# Patient Record
Sex: Female | Born: 1990 | Race: Black or African American | Hispanic: No | Marital: Married | Smoking: Former smoker
Health system: Southern US, Community
[De-identification: ages and names within clinical notes are randomized; demographics above are authoritative.]

## PROBLEM LIST (undated history)

## (undated) ENCOUNTER — Inpatient Hospital Stay (HOSPITAL_COMMUNITY): Payer: Self-pay

## (undated) ENCOUNTER — Emergency Department (HOSPITAL_COMMUNITY): Discharge status: Absent without leave | Payer: Medicaid Other

## (undated) DIAGNOSIS — N760 Acute vaginitis: Secondary | ICD-10-CM

## (undated) DIAGNOSIS — Z3490 Encounter for supervision of normal pregnancy, unspecified, unspecified trimester: Secondary | ICD-10-CM

## (undated) DIAGNOSIS — O139 Gestational [pregnancy-induced] hypertension without significant proteinuria, unspecified trimester: Secondary | ICD-10-CM

## (undated) DIAGNOSIS — B9689 Other specified bacterial agents as the cause of diseases classified elsewhere: Secondary | ICD-10-CM

## (undated) DIAGNOSIS — L709 Acne, unspecified: Secondary | ICD-10-CM

## (undated) DIAGNOSIS — M419 Scoliosis, unspecified: Secondary | ICD-10-CM

## (undated) DIAGNOSIS — O99345 Other mental disorders complicating the puerperium: Principal | ICD-10-CM

## (undated) DIAGNOSIS — F53 Postpartum depression: Secondary | ICD-10-CM

## (undated) DIAGNOSIS — O039 Complete or unspecified spontaneous abortion without complication: Secondary | ICD-10-CM

## (undated) HISTORY — DX: Scoliosis, unspecified: M41.9

## (undated) HISTORY — DX: Other mental disorders complicating the puerperium: O99.345

## (undated) HISTORY — DX: Complete or unspecified spontaneous abortion without complication: O03.9

## (undated) HISTORY — PX: NO PAST SURGERIES: SHX2092

## (undated) HISTORY — DX: Acute vaginitis: N76.0

## (undated) HISTORY — DX: Postpartum depression: F53.0

## (undated) HISTORY — DX: Acne, unspecified: L70.9

## (undated) HISTORY — DX: Other specified bacterial agents as the cause of diseases classified elsewhere: B96.89

---
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
## 2011-09-27 ENCOUNTER — Encounter (HOSPITAL_COMMUNITY): Payer: Self-pay | Admitting: Emergency Medicine

## 2011-09-27 ENCOUNTER — Emergency Department (HOSPITAL_COMMUNITY): Payer: Medicaid Other

## 2011-09-27 ENCOUNTER — Emergency Department (HOSPITAL_COMMUNITY)
Admission: EM | Admit: 2011-09-27 | Discharge: 2011-09-28 | Disposition: A | Discharge status: Home / Self Care | Payer: Medicaid Other | Source: Home / Self Care | Attending: Emergency Medicine | Admitting: Emergency Medicine

## 2011-09-27 DIAGNOSIS — R109 Unspecified abdominal pain: Secondary | ICD-10-CM | POA: Insufficient documentation

## 2011-09-27 DIAGNOSIS — O039 Complete or unspecified spontaneous abortion without complication: Secondary | ICD-10-CM | POA: Insufficient documentation

## 2011-09-27 DIAGNOSIS — O200 Threatened abortion: Secondary | ICD-10-CM | POA: Insufficient documentation

## 2011-09-27 HISTORY — DX: Encounter for supervision of normal pregnancy, unspecified, unspecified trimester: Z34.90

## 2011-09-27 LAB — DIFFERENTIAL
Basophils Absolute: 0 10*3/uL (ref 0.0–0.1)
Eosinophils Absolute: 0.2 10*3/uL (ref 0.0–0.7)
Lymphocytes Relative: 22 % (ref 12–46)
Lymphs Abs: 2.3 10*3/uL (ref 0.7–4.0)
Neutro Abs: 7.3 10*3/uL (ref 1.7–7.7)
Neutrophils Relative %: 71 % (ref 43–77)

## 2011-09-27 LAB — BASIC METABOLIC PANEL
CO2: 23 mEq/L (ref 19–32)
Chloride: 103 mEq/L (ref 96–112)
Creatinine, Ser: 0.62 mg/dL (ref 0.50–1.10)
Glucose, Bld: 84 mg/dL (ref 70–99)
Potassium: 4.2 mEq/L (ref 3.5–5.1)

## 2011-09-27 LAB — CBC
MCH: 28.9 pg (ref 26.0–34.0)
Platelets: 318 10*3/uL (ref 150–400)
RBC: 4.32 MIL/uL (ref 3.87–5.11)

## 2011-09-27 LAB — PREGNANCY, URINE: Preg Test, Ur: POSITIVE — AB

## 2011-09-27 LAB — WET PREP, GENITAL: Yeast Wet Prep HPF POC: NONE SEEN

## 2011-09-27 LAB — HCG, QUANTITATIVE, PREGNANCY: hCG, Beta Chain, Quant, S: 1630 m[IU]/mL — ABNORMAL HIGH (ref <5)

## 2011-09-27 MED ORDER — ACETAMINOPHEN 325 MG PO TABS
650.0000 mg | ORAL_TABLET | Freq: Once | ORAL | Status: AC
  Administered 2011-09-27: 650 mg via ORAL
  Filled 2011-09-27: qty 2

## 2011-09-27 NOTE — ED Notes (Addendum)
Pt states she is [redacted] weeks pregnant.  C/o lower abd cramping and lower back pain since 4am.  Also reports "light" vaginal bleeding since 8am.  LMP 07/16/11

## 2011-09-27 NOTE — ED Notes (Signed)
WARM BLANKETS PROVIDED WHILE WAITING AT TRIAGE.

## 2011-09-27 NOTE — ED Provider Notes (Signed)
History     CSN: [id number]  Arrival date & time 09/27/11  Linda Warner   First MD Initiated Contact with Patient 09/27/11 2102      Chief Complaint  Patient presents with  . Abdominal Cramping  . Vaginal Bleeding   HPI  History provided by the patient. Patient is a 21 year old female who is G1 P0 currently believed to be [redacted] weeks pregnant who presents with complaints of lower abdominal cramping and small amounts of vaginal bleeding and began early this morning. Patient states that she has her first on July appointment with Dr. Emelda Fear this coming week but is not had any evaluation so far for her pregnancy. Patient has been feeling well until early this morning she had lower abdominal cramping and sharp pains in small amounts of bleeding. Patient mostly notices bleeding when using the restroom and wiping with tissue. She has not noticed any other vaginal discharge. Patient denies any significant nausea, vomiting symptoms. She denies fever, chills, sweats, diarrhea or constipation. She denies any dysuria, urinary frequency or hematuria. Patient has no other significant medical conditions.     Past Medical History  Diagnosis Date  . Acne   . Scoliosis   . Pregnant     History reviewed. No pertinent past surgical history.  Family History  Problem Relation Age of Onset  . Depression Father   . Depression Maternal Grandmother     History  Substance Use Topics  . Smoking status: Never Smoker   . Smokeless tobacco: Not on file  . Alcohol Use: No    OB History    Grav Para Term Preterm Abortions TAB SAB Ect Mult Living   1               Review of Systems  Constitutional: Negative for fever and chills.  Respiratory: Negative for shortness of breath.   Cardiovascular: Negative for chest pain.  Gastrointestinal: Positive for abdominal pain. Negative for nausea, vomiting, diarrhea and constipation.  Genitourinary: Positive for vaginal bleeding. Negative for dysuria, frequency,  hematuria, flank pain and vaginal discharge.  Skin: Negative for rash.    Allergies  Review of patient's allergies indicates no known allergies.  Home Medications   Current Outpatient Rx  Name Route Sig Dispense Refill  . DIPHENHYDRAMINE HCL 25 MG PO TABS Oral Take 25 mg by mouth daily as needed. For allergies    . PRENATAL 27-0.8 MG PO TABS Oral Take 1 tablet by mouth daily.      BP 115/79  Pulse 98  Temp(Src) 98.4 F (36.9 C) (Oral)  Resp 18  SpO2 100%  LMP 07/16/2011  Physical Exam  Nursing note and vitals reviewed. Constitutional: She is oriented to person, place, and time. She appears well-developed and well-nourished. No distress.  HENT:  Head: Normocephalic and atraumatic.  Cardiovascular: Normal rate and regular rhythm.   Pulmonary/Chest: Effort normal and breath sounds normal. No respiratory distress. She has no wheezes. She has no rales.  Abdominal: Soft. She exhibits no distension. There is tenderness in the suprapubic area. There is no rebound, no guarding, no CVA tenderness, no tenderness at McBurney's point and negative Murphy's sign.       Mild suprapubic tenderness  Genitourinary:       Chaperone was present. There is vaginal bleeding and discharge from cervix. Cervix os is closed. No significant tenderness or adnexal mass or pressure.  Neurological: She is alert and oriented to person, place, and time.  Skin: Skin is warm and dry. No rash  noted.  Psychiatric: She has a normal mood and affect. Her behavior is normal.    ED Course  Procedures   Results for orders placed during the hospital encounter of 09/27/11  CBC      Component Value Range   WBC 10.3  4.0 - 10.5 (K/uL)   RBC 4.32  3.87 - 5.11 (MIL/uL)   Hemoglobin 12.5  12.0 - 15.0 (g/dL)   HCT 16.1 (*) 09.6 - 46.0 (%)   MCV 81.0  78.0 - 100.0 (fL)   MCH 28.9  26.0 - 34.0 (pg)   MCHC 35.7  30.0 - 36.0 (g/dL)   RDW 04.5  40.9 - 81.1 (%)   Platelets 318  150 - 400 (K/uL)  DIFFERENTIAL       Component Value Range   Neutrophils Relative 71  43 - 77 (%)   Neutro Abs 7.3  1.7 - 7.7 (K/uL)   Lymphocytes Relative 22  12 - 46 (%)   Lymphs Abs 2.3  0.7 - 4.0 (K/uL)   Monocytes Relative 5  3 - 12 (%)   Monocytes Absolute 0.5  0.1 - 1.0 (K/uL)   Eosinophils Relative 2  0 - 5 (%)   Eosinophils Absolute 0.2  0.0 - 0.7 (K/uL)   Basophils Relative 0  0 - 1 (%)   Basophils Absolute 0.0  0.0 - 0.1 (K/uL)  BASIC METABOLIC PANEL      Component Value Range   Sodium 138  135 - 145 (mEq/L)   Potassium 4.2  3.5 - 5.1 (mEq/L)   Chloride 103  96 - 112 (mEq/L)   CO2 23  19 - 32 (mEq/L)   Glucose, Bld 84  70 - 99 (mg/dL)   BUN 9  6 - 23 (mg/dL)   Creatinine, Ser 9.14  0.50 - 1.10 (mg/dL)   Calcium 9.7  8.4 - 78.2 (mg/dL)   GFR calc non Af Amer >90  >90 (mL/min)   GFR calc Af Amer >90  >90 (mL/min)  HCG, QUANTITATIVE, PREGNANCY      Component Value Range   hCG, Beta Chain, Quant, S 1630 (*) <5 (mIU/mL)  ABO/RH      Component Value Range   ABO/RH(D) A POS        US Ob Comp Less 14 Wks  09/28/2011  *RADIOLOGY REPORT*  Clinical Data: Vaginal bleeding and pain.  Quantitative beta HCG 1630.  Estimated gestational age by LMP is 10 weeks four dates  OBSTETRIC <14 WK [place] AND TRANSVAGINAL OB US  Technique:  Both transabdominal and transvaginal ultrasound examinations were performed for complete evaluation of the gestation as well as the maternal uterus, adnexal regions, and pelvic cul-de-sac.  Transvaginal technique was performed to assess early pregnancy.  Comparison:  None.  Intrauterine gestational sac:  A single intrauterine gestational sac is visualized. The gestational sac is somewhat elongated, extending down to the lower uterine segment. Yolk sac: Not visualized Embryo: Present.  No fetal motion is visualized. Cardiac Activity: Not demonstrated Heart Rate: N/A bpm  CRL: 30   mm  9   w  6   d          [place] EDC: 04/26/2012  Maternal uterus/adnexae: Cervical length is measured at 1.1 cm.  Ovaries are  visualized on transabdominal imaging and appear symmetrical.  No abnormal adnexal masses.  No free pelvic fluid collections.  IMPRESSION: Single intrauterine pregnancy is visualized.  Estimated gestational age by crown-rump length is 9 weeks 6 days.  No fetal motion or  fetal cardiac activity are observed during real time imaging. Changes are worrisome for fetal demise.  Results discussed by telephone at the time of dictation, 0055 hours on 09/28/2011, with Dr. Patria Mane.  Original Report Authenticated By: Marlon Pel, M.D.   US Ob Transvaginal  09/28/2011  *RADIOLOGY REPORT*  Clinical Data: Vaginal bleeding and pain.  Quantitative beta HCG 1630.  Estimated gestational age by LMP is 10 weeks four dates  OBSTETRIC <14 WK [place] AND TRANSVAGINAL OB US  Technique:  Both transabdominal and transvaginal ultrasound examinations were performed for complete evaluation of the gestation as well as the maternal uterus, adnexal regions, and pelvic cul-de-sac.  Transvaginal technique was performed to assess early pregnancy.  Comparison:  None.  Intrauterine gestational sac:  A single intrauterine gestational sac is visualized. The gestational sac is somewhat elongated, extending down to the lower uterine segment. Yolk sac: Not visualized Embryo: Present.  No fetal motion is visualized. Cardiac Activity: Not demonstrated Heart Rate: N/A bpm  CRL: 30   mm  9   w  6   d          [place] EDC: 04/26/2012  Maternal uterus/adnexae: Cervical length is measured at 1.1 cm.  Ovaries are visualized on transabdominal imaging and appear symmetrical.  No abnormal adnexal masses.  No free pelvic fluid collections.  IMPRESSION: Single intrauterine pregnancy is visualized.  Estimated gestational age by crown-rump length is 9 weeks 6 days.  No fetal motion or fetal cardiac activity are observed during real time imaging. Changes are worrisome for fetal demise.  Results discussed by telephone at the time of dictation, 0055 hours on 09/28/2011, with Dr.  Patria Mane.  Original Report Authenticated By: Marlon Pel, M.D.     1. Threatened abortion       MDM  Patient seen and evaluated. Patient no acute distress.   Have discussed findings from last testing ultrasounds with patient. She understands for concerning findings. Patient has been instructed to followup with OB/GYN or return to the [facility] hospital is developing any worsening symptoms or having large amounts of bleeding.     Angus Seller, [place] 09/28/11 [phone]

## 2011-09-28 ENCOUNTER — Encounter (HOSPITAL_COMMUNITY): Payer: Self-pay | Admitting: *Deleted

## 2011-09-28 ENCOUNTER — Inpatient Hospital Stay (HOSPITAL_COMMUNITY)
Admission: AD | Admit: 2011-09-28 | Discharge: 2011-09-28 | Disposition: A | Discharge status: Home / Self Care | Payer: Medicaid Other | Source: Ambulatory Visit | Attending: Obstetrics & Gynecology | Admitting: Obstetrics & Gynecology

## 2011-09-28 DIAGNOSIS — O039 Complete or unspecified spontaneous abortion without complication: Secondary | ICD-10-CM

## 2011-09-28 LAB — URINALYSIS, ROUTINE W REFLEX MICROSCOPIC
Glucose, UA: NEGATIVE mg/dL
Ketones, ur: 15 mg/dL — AB
Leukocytes, UA: NEGATIVE
pH: 6 (ref 5.0–8.0)

## 2011-09-28 LAB — CBC
Hemoglobin: 11.5 g/dL — ABNORMAL LOW (ref 12.0–15.0)
MCHC: 34.5 g/dL (ref 30.0–36.0)
RBC: 4.06 MIL/uL (ref 3.87–5.11)

## 2011-09-28 LAB — URINE MICROSCOPIC-ADD ON

## 2011-09-28 MED ORDER — IBUPROFEN 600 MG PO TABS: 600.0000 mg | ORAL_TABLET | Freq: Four times a day (QID) | ORAL | Status: AC | PRN

## 2011-09-28 MED ORDER — OXYCODONE-ACETAMINOPHEN 5-325 MG PO TABS: 1.0000 | ORAL_TABLET | Freq: Four times a day (QID) | ORAL | Status: AC | PRN

## 2011-09-28 MED ORDER — LACTATED RINGERS IV SOLN
INTRAVENOUS | Status: DC
  Administered 2011-09-28: 08:00:00 via INTRAVENOUS

## 2011-09-28 MED ORDER — HYDROMORPHONE HCL PF 1 MG/ML IJ SOLN
1.0000 mg | Freq: Once | INTRAMUSCULAR | Status: AC
  Administered 2011-09-28: 1 mg via INTRAVENOUS
  Filled 2011-09-28: qty 1

## 2011-09-28 NOTE — Discharge Instructions (Signed)
You were seen and evaluated today for your symptoms of vaginal bleeding and abdominal cramping during her pregnancy. At this time your ultrasound showed signs concerning for threatened miscarriage. Please read the attached information regarding this diagnosis. Please followup with your OB/GYN doctor tomorrow. Return to the [facility] hospital emergency room if you have any heavy bleeding, lightheadedness, fever, chills or persistent nausea vomiting.   Threatened Miscarriage Bleeding during the first 20 weeks of pregnancy is common. This is sometimes called a threatened miscarriage. This is a pregnancy that is threatening to end before the twentieth week of pregnancy. Often this bleeding stops with bed rest or decreased activities as suggested by your caregiver and the pregnancy continues without any more problems. You may be asked to not have sexual intercourse, have orgasms or use tampons until further notice. Sometimes a threatened miscarriage can progress to a complete or incomplete miscarriage. This may or may not require further treatment. Some miscarriages occur before a woman misses a menstrual period and knows she is pregnant. Miscarriages occur in 15 to 20% of all pregnancies and usually occur during the first 13 weeks of the pregnancy. The exact cause of a miscarriage is usually never known. A miscarriage is natures way of ending a pregnancy that is abnormal or would not make it to term. There are some things that may put you at risk to have a miscarriage, such as:  Hormone problems.   Infection of the uterus or cervix.   Chronic illness, diabetes for example, especially if it is not controlled.   Abnormal shaped uterus.   Fibroids in the uterus.   Incompetent cervix (the cervix is too weak to hold the baby).   Smoking.   Drinking too much alcohol. It's best not to drink any alcohol when you are pregnant.   Taking illegal drugs.  TREATMENT  When a miscarriage becomes complete and all  products of conception (all the tissue in the uterus) have been passed, often no treatment is needed. If you think you passed tissue, save it in a container and take it to your doctor for evaluation. If the miscarriage is incomplete (parts of the fetus or placenta remain in the uterus), further treatment may be needed. The most common reason for further treatment is continued bleeding (hemorrhage) because pregnancy tissue did not pass out of the uterus. This often occurs if a miscarriage is incomplete. Tissue left behind may also become infected. Treatment usually is dilatation and curettage (the removal of the remaining products of pregnancy. This can be done by a simple sucking procedure (suction curettage) or a simple scraping of the inside of the uterus. This may be done in the hospital or in the caregiver's office. This is only done when your caregiver knows that there is no chance for the pregnancy to proceed to term. This is determined by physical examination, negative pregnancy test, falling pregnancy hormone count and/or, an ultrasound revealing a dead fetus. Miscarriages are often a very emotional time for prospective mothers and fathers. This is not you or your partners fault. It did not occur because of an inadequacy in you or your partner. Nearly all miscarriages occur because the pregnancy has started off wrongly. At least half of these pregnancies have a chromosomal abnormality. It is almost always not inherited. Others may have developmental problems with the fetus or placenta. This does not always show up even when the products miscarried are studied under the microscope. The miscarriage is nearly always not your fault and it is  not likely that you could have prevented it from happening. If you are having emotional and grieving problems, talk to your health care provider and even seek counseling, if necessary, before getting pregnant again. You can begin trying for another pregnancy as soon as your  caregiver says it is OK. HOME CARE INSTRUCTIONS   Your caregiver may order bed rest depending on how much bleeding and cramping you are having. You may be limited to only getting up to go to the bathroom. You may be allowed to continue light activity. You may need to make arrangements for the care of your other children and for any other responsibilities.   Keep track of the number of pads you use each day, how often you have to change pads and how saturated (soaked) they are. Record this information.   DO NOT USE TAMPONS. Do not douche, have sexual intercourse or orgasms until approved by your caregiver.   You may receive a follow up appointment for re-evaluation of your pregnancy and a repeat blood test. Re-evaluation often occurs after 2 days and again in 4 to 6 weeks. It is very important that you follow-up in the recommended time period.   If you are Rh negative and the father is Rh positive or you do not know the fathers' blood type, you may receive a shot (Rh immune globulin) to help prevent abnormal antibodies that can develop and affect the baby in any future pregnancies.  SEEK IMMEDIATE MEDICAL CARE IF:  You have severe cramps in your stomach, back, or abdomen.   You have a sudden onset of severe pain in the lower part of your abdomen.   You develop chills.   You run an unexplained temperature of 101 F (38.3 C) or higher.   You pass large clots or tissue. Save any tissue for your caregiver to inspect.   Your bleeding increases or you become light-headed, weak, or have fainting episodes.   You have a gush of fluid from your vagina.   You pass out. This could mean you have a tubal (ectopic) pregnancy.  Document Released: 05/04/2005 Document Revised: 04/23/2011 Document Reviewed: 12/19/2007 [facility] Patient Information 2012 [place], [place].   RESOURCE GUIDE  Dental Problems  Patients with Medicaid: [facility] [phone] [address]                                           [phone] [address] Phone:  [phone]                                                  Phone:  [phone]  If unable to pay or uninsured, contact:  [facility] or [facility]. to become qualified for the adult dental clinic.  Chronic Pain Problems Contact [facility]  [phone] Patients need to be referred by their primary care doctor.  Insufficient Money for Medicine Contact [employer] Way:  call "211" or [facility] (731)387-3623.  No Primary Care Doctor Call [facility]  [phone] Other agencies that provide inexpensive medical care    [facility]  [phone]    [facility]  5198798910    [facility]  [phone]    [facility]  [phone]    [facility]  [phone]    [facility]  [phone]  Psychological Services [facility]  [phone] [facility] [place] [facility]  [phone] [facility]   [phone] (emergency services 2363324748)  Substance Abuse Resources [facility]  520-421-3607 [facility] 330-212-2148 [facility] 703-638-9811 Floydene Flock ([phone] [phone] Residential & Outpatient Substance Abuse Program  726-782-2190  Abuse/Neglect [facility] Child Abuse Hotline (727) 694-3491 [facility] Child Abuse Hotline 4093187023 (After Hours)  Emergency Shelter [facility] (248)078-9700  Maternity Homes [facility] at [facility] [place] [facility] 513-697-8940 [place] [facility] 6166775723  MRSA Hotline #:   810-546-8777    [facility] Resources  [facility] [place]     [employer]                          [facility] Dept. [address] [place]                       [address]      [address] [place] [address]  Blondell Reveal Phone:   [phone]                                   Phone:  [phone]                 Phone:  [phone]  [facility] Phone:  [phone]  [facility] Child Abuse Hotline 720-079-7737 403-649-9853 (After Hours)

## 2011-09-28 NOTE — MAU Note (Signed)
Was at [facility] yesterday. Dx with miscarriage.  0400 yesterday woke up with pain, went to [facility] at 1800. States was not bleeding this heavy when dc'd.  Heavy bleeding started this morning. Had thin pad on - soaked through- blood on clothes and wc.  Feeling light headed.Marland Kitchen

## 2011-09-28 NOTE — MAU Note (Signed)
Pad changed and peri care when settled in to rm. [place] from [facility] visit reviewed, explained to pt.  Failed preg- stopped growing at [redacted]w[redacted]d, no cardiac activity. Sac in lower uterus.  Pt is having cramping and bleeding because her body is trying to pass it.

## 2011-09-28 NOTE — MAU Provider Note (Signed)
History     CSN: [id number]  Arrival date and time: 09/28/11 0748   First Provider Initiated Contact with Patient 09/28/11 [phone]     20 y.o.G1P0 @[redacted]w[redacted]d  Chief Complaint  Patient presents with  . Miscarriage   HPI Pt presents with heavy vaginal bleeding starting a couple of hours ago, with 1 full pad soaked in last hour and passing a couple of fist-sized clots.  She was in MAU yesterday and diagnosed with threatened miscarriage and had u/s and quant hcg.  She denies h/a, dizziness, fever/chills.  Bleeding is decreased while pt in MAU.  OB History    Grav Para Term Preterm Abortions TAB SAB Ect Mult Living   1               Past Medical History  Diagnosis Date  . Acne   . Scoliosis   . Pregnant   . Scoliosis     Past Surgical History  Procedure Date  . No past surgeries     Family History  Problem Relation Age of Onset  . Depression Father   . Depression Maternal Grandmother   . Anesthesia problems Neg Hx     History  Substance Use Topics  . Smoking status: Former Games developer  . Smokeless tobacco: Never Used   Comment: quit prior to preg  . Alcohol Use: No    Allergies: No Known Allergies  Prescriptions prior to admission  Medication Sig Dispense Refill  . diphenhydrAMINE (BENADRYL) 25 MG tablet Take 25 mg by mouth daily as needed. For allergies      . oxyCODONE-acetaminophen (PERCOCET) 5-325 MG per tablet Take 1-2 tablets by mouth every 6 (six) hours as needed for pain.  20 tablet  0  . Prenatal Vit-Fe Fumarate-FA (MULTIVITAMIN-PRENATAL) 27-0.8 MG TABS Take 1 tablet by mouth daily.        Review of Systems  Constitutional: Negative for fever, chills and malaise/fatigue.  Eyes: Negative for blurred vision.  Respiratory: Negative for cough and shortness of breath.   Cardiovascular: Negative for chest pain.  Gastrointestinal: Negative for heartburn, nausea and vomiting.  Genitourinary: Negative for dysuria, urgency and frequency.  Musculoskeletal: Negative.     Neurological: Negative for dizziness and headaches.  Psychiatric/Behavioral: Negative for depression.   Physical Exam   Blood pressure 123/76, pulse 80, temperature 97.1 F (36.2 C), temperature source Oral, resp. rate 20, last menstrual period 07/16/2011, SpO2 10.00%.  Physical Exam  Nursing note and vitals reviewed. Constitutional: She is oriented to person, place, and time. She appears well-developed and well-nourished.  Neck: Normal range of motion.  Cardiovascular: Normal rate, regular rhythm and normal heart sounds.   Respiratory: Effort normal and breath sounds normal.  GI: Soft.  Genitourinary:       Pelvic exam: Cervix pink, without lesion, large amount bright red blood without clots in vaginal vault Bimanual exam: Cervix soft, slightly open, long, posterior, uterus tender upon palpation, nonpregnant size, adnexa without enlargement or mass   Musculoskeletal: Normal range of motion.  Neurological: She is alert and oriented to person, place, and time.  Skin: Skin is warm and dry.  Psychiatric: She has a normal mood and affect. Her behavior is normal. Judgment and thought content normal.   Recent Results (from the past 168 hour(s))  CBC   Collection Time   09/27/11 10:01 PM      Component Value Range   WBC 10.3  4.0 - 10.5 (K/uL)   RBC 4.32  3.87 - 5.11 (MIL/uL)   Hemoglobin  12.5  12.0 - 15.0 (g/dL)   HCT 45.4 (*) 09.8 - 46.0 (%)   MCV 81.0  78.0 - 100.0 (fL)   MCH 28.9  26.0 - 34.0 (pg)   MCHC 35.7  30.0 - 36.0 (g/dL)   RDW 11.9  14.7 - 82.9 (%)   Platelets 318  150 - 400 (K/uL)  DIFFERENTIAL   Collection Time   09/27/11 10:01 PM      Component Value Range   Neutrophils Relative 71  43 - 77 (%)   Neutro Abs 7.3  1.7 - 7.7 (K/uL)   Lymphocytes Relative 22  12 - 46 (%)   Lymphs Abs 2.3  0.7 - 4.0 (K/uL)   Monocytes Relative 5  3 - 12 (%)   Monocytes Absolute 0.5  0.1 - 1.0 (K/uL)   Eosinophils Relative 2  0 - 5 (%)   Eosinophils Absolute 0.2  0.0 - 0.7 (K/uL)    Basophils Relative 0  0 - 1 (%)   Basophils Absolute 0.0  0.0 - 0.1 (K/uL)  BASIC METABOLIC PANEL   Collection Time   09/27/11 10:01 PM      Component Value Range   Sodium 138  135 - 145 (mEq/L)   Potassium 4.2  3.5 - 5.1 (mEq/L)   Chloride 103  96 - 112 (mEq/L)   CO2 23  19 - 32 (mEq/L)   Glucose, Bld 84  70 - 99 (mg/dL)   BUN 9  6 - 23 (mg/dL)   Creatinine, Ser 5.62  0.50 - 1.10 (mg/dL)   Calcium 9.7  8.4 - 13.0 (mg/dL)   GFR calc non Af Amer >90  >90 (mL/min)   GFR calc Af Amer >90  >90 (mL/min)  HCG, QUANTITATIVE, PREGNANCY   Collection Time   09/27/11 10:01 PM      Component Value Range   hCG, Beta Chain, Quant, S 1630 (*) <5 (mIU/mL)  ABO/RH   Collection Time   09/27/11 10:03 PM      Component Value Range   ABO/RH(D) A POS    PREGNANCY, URINE   Collection Time   09/27/11 10:12 PM      Component Value Range   Preg Test, Ur POSITIVE (*) NEGATIVE   URINALYSIS, ROUTINE W REFLEX MICROSCOPIC   Collection Time   09/27/11 10:12 PM      Component Value Range   Color, Urine YELLOW  YELLOW    APPearance CLOUDY (*) CLEAR    Specific Gravity, Urine 1.025  1.005 - 1.030    pH 6.0  5.0 - 8.0    Glucose, UA NEGATIVE  NEGATIVE (mg/dL)   Hgb urine dipstick LARGE (*) NEGATIVE    Bilirubin Urine NEGATIVE  NEGATIVE    Ketones, ur 15 (*) NEGATIVE (mg/dL)   Protein, ur NEGATIVE  NEGATIVE (mg/dL)   Urobilinogen, UA 0.2  0.0 - 1.0 (mg/dL)   Nitrite NEGATIVE  NEGATIVE    Leukocytes, UA NEGATIVE  NEGATIVE   URINE MICROSCOPIC-ADD ON   Collection Time   09/27/11 10:12 PM      Component Value Range   Squamous Epithelial / LPF FEW (*) RARE    WBC, UA 3-6  <3 (WBC/hpf)   RBC / HPF 0-2  <3 (RBC/hpf)   Bacteria, UA MANY (*) RARE    Urine-Other MUCOUS PRESENT    WET PREP, GENITAL   Collection Time   09/27/11 11:09 PM      Component Value Range   Yeast Wet Prep HPF POC NONE SEEN  NONE SEEN    Trich, Wet Prep NONE SEEN  NONE SEEN    Clue Cells Wet Prep HPF POC FEW (*) NONE SEEN    WBC, Wet  Prep HPF POC FEW (*) NONE SEEN   GC/CHLAMYDIA PROBE AMP, GENITAL   Collection Time   09/27/11 11:10 PM      Component Value Range   GC Probe Amp, Genital NEGATIVE  NEGATIVE    Chlamydia, DNA Probe NEGATIVE  NEGATIVE   SAMPLE TO BLOOD BANK   Collection Time   09/28/11  8:15 AM      Component Value Range   Blood Bank Specimen SAMPLE AVAILABLE FOR TESTING     Sample Expiration 10/01/2011    HCG, QUANTITATIVE, PREGNANCY   Collection Time   09/28/11  8:15 AM      Component Value Range   hCG, Beta Chain, Quant, S 1258 (*) <5 (mIU/mL)  CBC   Collection Time   09/28/11  8:16 AM      Component Value Range   WBC 7.6  4.0 - 10.5 (K/uL)   RBC 4.06  3.87 - 5.11 (MIL/uL)   Hemoglobin 11.5 (*) 12.0 - 15.0 (g/dL)   HCT 16.1 (*) 09.6 - 46.0 (%)   MCV 82.0  78.0 - 100.0 (fL)   MCH 28.3  26.0 - 34.0 (pg)   MCHC 34.5  30.0 - 36.0 (g/dL)   RDW 04.5  40.9 - 81.1 (%)   Platelets 291  150 - 400 (K/uL)    MAU Course  Procedures CBC, quantitative hcg, IV LR, IV dilaudid  Assessment and Plan  SAB  D/C home with bleeding precautions Repeat quantitative hcg in MAU in 1 week Return to MAU as needed  Warner, Linda Krieger 09/28/2011, 8:21 AM

## 2011-09-28 NOTE — Discharge Instructions (Signed)
Miscarriage (Spontaneous Miscarriage)  A miscarriage is when you lose your baby before the twentieth week of pregnancy. Miscarriages happen in 15-20% of pregnancies. Most miscarriages happen in the first 13 weeks of the pregnancy. In medical terms, this is called a spontaneous miscarriage or early pregnancy loss. No further treatment is needed when the miscarriage is complete and all products of conception have been passed out of the body. You can begin trying for another pregnancy as soon as your caregiver says it is okay.  CAUSES    Most causes are not known.   Genetic problems like abnormal, not enough or too many chromosomes.   Infection of the cervix or uterus.   An abnormal shaped uterus, fibroid tumors or congenital abnormalities.   Hormone problems.   Medical problems.   Incompetent cervix, the tissue in the cervix is not strong enough to hold the pregnancy.   Smoking, too much alcohol use and illegal drugs.   Trauma.  SYMPTOMS    Bleeding or spotting from the vagina.   Cramping of the lower abdomen.   Passing of fluid from the vagina with or without cramps or pain.   Passing fetal tissue.  TREATMENT    Sometimes no further treatment is necessary if you pass all the tissue in the uterus.   If partial parts of the fetus or placenta remain in the body (incomplete miscarriage), tissue left behind may become infected. Usually a D and C (Dilatation and Curettage) suction or scrapping of the uterus is necessary to remove the remaining tissue in uterus. The procedure is only done when your caregiver knows that there is no chance for the pregnancy to continue. This is determined by a physical exam, a negative pregnancy test, blood tests and perhaps an ultrasound revealing a dead fetus or no fetus developing because a problem occurred at conception (when the sperm and egg unite).   Medications may be necessary, antibiotics if there is an infection or medications to contract the uterus if there is a  lot of bleeding.   If you have Rh negative blood and your partner is Rh positive, you will need a Rho-gam shot (an immune globulin vaccine). This will protect your baby from having Rh blood problems in future pregnancies.  HOME CARE INSTRUCTIONS    Your caregiver may order bed rest (up to the bathroom only). He or she may allow you to continue light activity. You may need to make arrangements for the care of children and for any other responsibilities.   Keep track of the number of pads you use each day and how soaked (saturated) they are. Record this information.   Do not use tampons. Do not douche or have sexual intercourse until approved by your caregiver.   Only take over-the-counter or prescription medicines for pain, discomfort or fever as directed by your caregiver.   Do not take aspirin because it can cause bleeding.   It is very important to keep all follow-up appointments for re-evaluations and continuing management.   Tell your caregiver if you are experiencing domestic violence.   Women who have an Rh negative blood type (i.e., A, B, AB, or O negative) need to receive a drug called Rh(D) immune globulin (RhoGam). This medicine helps protect future fetuses against problems that can occur if an Rh negative mother is carrying a baby who is Rh positive.   If you and/or your partner are having problems with guilt or grieving, talk to your caregiver or seek counseling to help   you cope with the pregnancy loss. Allow enough time to grieve before trying to get pregnant again.  SEEK IMMEDIATE MEDICAL CARE IF:    You have severe cramps or pain in your stomach, back, or belly (abdomen).   You have a fever.   You pass large clots or tissue. Save any tissue for your caregiver to inspect.   Your bleeding increases.   You become light-headed, weak or have fainting episodes.   You develop chills.  Document Released: 10/28/2000 Document Revised: 04/23/2011 Document Reviewed: 12/05/2007  [employer] Patient  Information 2012 [employer].

## 2011-09-29 LAB — GC/CHLAMYDIA PROBE AMP, URINE: Chlamydia, Swab/Urine, PCR: POSITIVE — AB

## 2011-09-29 NOTE — MAU Provider Note (Signed)
Attestation of Attending Supervision of Advanced Practitioner: Evaluation and management procedures were performed by the OB Fellow/PA/CNM/NP under my supervision and collaboration. Chart reviewed, and agree with management and plan.  Marvell Tamer, M.D. 09/29/2011 9:34 AM   

## 2011-09-29 NOTE — ED Provider Notes (Signed)
Medical screening examination/treatment/procedure(s) were performed by non-physician practitioner and as supervising physician I was immediately available for consultation/collaboration.  Donnetta Hutching, MD 09/29/11 1039

## 2011-09-30 NOTE — ED Notes (Signed)
Results received from [facility]. (+) Urine for Chlamydia.  Chart to MD office for review.

## 2011-10-02 NOTE — ED Notes (Signed)
Chart reviewed by C.Williams with order written  for Azithromycin 500 mg #2 take both tabs at once(1 gram).

## 2011-10-02 NOTE — ED Notes (Signed)
No answer

## 2011-10-06 ENCOUNTER — Encounter (HOSPITAL_COMMUNITY): Payer: Self-pay

## 2011-10-06 ENCOUNTER — Inpatient Hospital Stay (HOSPITAL_COMMUNITY)
Admission: AD | Admit: 2011-10-06 | Discharge: 2011-10-06 | Disposition: A | Discharge status: Home / Self Care | Payer: Medicaid Other | Source: Ambulatory Visit | Attending: Family Medicine | Admitting: Family Medicine

## 2011-10-06 DIAGNOSIS — O039 Complete or unspecified spontaneous abortion without complication: Secondary | ICD-10-CM | POA: Insufficient documentation

## 2011-10-06 NOTE — Progress Notes (Signed)
10/06/11 1130  Clinical Encounter Type  Visited With Patient  Visit Type Spiritual support;Social support (Bereavement (miscarriage))  Referral From Nurse Lupita Leash in MAU)  Spiritual Encounters  Spiritual Needs Emotional;Grief support  Stress Factors  Patient Stress Factors Family relationships;Loss of control (Socially isolated; recently lost job; now, miscarriage.)    Referred by Desyre's nurse Lupita Leash per pt request for pastoral support upon confirmation of her miscarriage.  Linda Warner reports little social support: her grandmother died, her mom lives in [place], she has complicated relationships with her siblings, and she lives with her boyfriend and his sister.  She states that she has a hard time opening up to people and has really needed someone to talk to.  Provided pastoral listening and reflection, grief and self-care education, encouragement, and witness to her story/struggle/hopes.  By the end of our visit, Linda Warner's affect was lighter and brighter, and she expressed gratitude for chaplain care and for her own emerging sense of hopefulness about her future.  Being able to generate steps in a self-care plan helped her to feel empowered.  She is aware of [facility] and has chaplains' number if she would like to reach out again, which I encouraged.  [address] [place], [place] [phone]

## 2011-10-06 NOTE — Discharge Instructions (Signed)
Miscarriage An early pregnancy loss or spontaneous abortion (miscarriage) is a common problem. This usually happens when the pregnancy is not developing normally. It is very unlikely that you or your partner did anything to cause this, although cigarette smoking, a sexually transmitted disease, excessive alcohol use, or drug abuse can increase the risk. Other causes are:  Abnormalities of the uterus.   Hormone or medical problems.   Trauma or genetic (chromosome) problems.  Having a miscarriage does not change your chances of having a normal pregnancy in the future. Your caregiver will advise you when it is safe to try to get pregnant again. AFTER A MISCARRIAGE  A miscarriage is inevitable when there is continual, heavy vaginal bleeding; cramping; dilation of the cervix; or passing of any pregnancy tissue. Bleeding and cramping will usually continue until all the tissue has been removed from the womb (uterus).   Often the uterus does not clean itself out completely. A medication or a D&C procedure is needed to loosen or remove the pregnancy tissue from the uterus. A D&C scrapes or suctions the tissue out.   If you are RH negative, you may need to have Rh immune globulin to avoid Rh problems.   You may be given medication to fight an infection if the miscarriage was due to an infection.  HOME CARE INSTRUCTIONS   You should rest in bed for the next 2 to 3 days.   Do not take tub baths or put anything in your vagina, including tampons or a douche.   Do not have sex until your caregiver approves.   Avoid exercise or heavy activities until directed by your caregiver.   Save any vaginal discharge that looks like tissue. Ask your caregiver if he or she wants to inspect the discharge.   If you and your partner are having problems with guilt or grieving, talk to your caregiver or get counseling to help you understand and cope with your pregnancy loss.   Allow enough time to grieve before  trying to get pregnant again.  SEEK IMMEDIATE MEDICAL CARE IF:   You have persistent heavy bleeding or a bad smelling vaginal discharge.   You have continued abdominal or pelvic pain.   You have an oral temperature above 102 F (38.9 C), not controlled by medicine.   You have severe weakness, fainting, or keep throwing up (vomiting).   You develop chills.   You are experiencing domestic violence.  MAKE SURE YOU:   Understand these instructions.   Will watch your condition.   Will get help right away if you are not doing well or get worse.  Document Released: 06/11/2004 Document Revised: 04/23/2011 Document Reviewed: 04/26/2008 [facility] Patient Information 2012 [place], [place].Miscarriage An early pregnancy loss or spontaneous abortion (miscarriage) is a common problem. This usually happens when the pregnancy is not developing normally. It is very unlikely that you or your partner did anything to cause this, although cigarette smoking, a sexually transmitted disease, excessive alcohol use, or drug abuse can increase the risk. Other causes are:  Abnormalities of the uterus.   Hormone or medical problems.   Trauma or genetic (chromosome) problems.  Having a miscarriage does not change your chances of having a normal pregnancy in the future. Your caregiver will advise you when it is safe to try to get pregnant again. AFTER A MISCARRIAGE  A miscarriage is inevitable when there is continual, heavy vaginal bleeding; cramping; dilation of the cervix; or passing of any pregnancy tissue. Bleeding and cramping  will usually continue until all the tissue has been removed from the womb (uterus).   Often the uterus does not clean itself out completely. A medication or a D&C procedure is needed to loosen or remove the pregnancy tissue from the uterus. A D&C scrapes or suctions the tissue out.   If you are RH negative, you may need to have Rh immune globulin to avoid Rh problems.   You may be  given medication to fight an infection if the miscarriage was due to an infection.  HOME CARE INSTRUCTIONS   You should rest in bed for the next 2 to 3 days.   Do not take tub baths or put anything in your vagina, including tampons or a douche.   Do not have sex until your caregiver approves.   Avoid exercise or heavy activities until directed by your caregiver.   Save any vaginal discharge that looks like tissue. Ask your caregiver if he or she wants to inspect the discharge.   If you and your partner are having problems with guilt or grieving, talk to your caregiver or get counseling to help you understand and cope with your pregnancy loss.   Allow enough time to grieve before trying to get pregnant again.  SEEK IMMEDIATE MEDICAL CARE IF:   You have persistent heavy bleeding or a bad smelling vaginal discharge.   You have continued abdominal or pelvic pain.   You have an oral temperature above 102 F (38.9 C), not controlled by medicine.   You have severe weakness, fainting, or keep throwing up (vomiting).   You develop chills.   You are experiencing domestic violence.  MAKE SURE YOU:   Understand these instructions.   Will watch your condition.   Will get help right away if you are not doing well or get worse.  Document Released: 06/11/2004 Document Revised: 04/23/2011 Document Reviewed: 04/26/2008 [facility] Patient Information 2012 [place], [place].

## 2011-10-06 NOTE — MAU Provider Note (Signed)
Patient returns for follow up BHCG.  She was initially seen on 5/12 with BHCG 1630, blood type A+.  She returned on 5/13 with heavy bleeding.  BHCG was 1258.  Dx with SAB.  She returns for repeat BHCG. ON REVIEW OF LABS, CHLAMYDIA ON URINE WAS POSITIVE AND ON SWAB NEGATIVE. When reported to patient by Lupita Leash, RN, she became upset.  I  discussed with Engineer, mining.  They have asked lab that did test to review.  In meantime, offer made by them to repeat swab at no charge to the patient, per lab director.  Explained to patient and she agrees to repeat swab.  Repeat GC/CH swab obtained. Results for orders placed during the hospital encounter of 10/06/11 (from the past 24 hour(s))  HCG, QUANTITATIVE, PREGNANCY     Status: Abnormal   Collection Time   10/06/11  8:54 AM      Component Value Range   hCG, Beta Chain, Quant, S 16 (*) <5 (mIU/mL)  A:  Spontaneous Abortion     BHCGs falling P:  Instructed hospital will call if chlamydia is positive       No intercourse until culture results back     F/U with Dr. Emelda Fear.  Has appt for prenatal care.  Patient will call to change appt to repeat lab/ followup after miscarriage.

## 2011-10-06 NOTE — MAU Note (Signed)
Patient to MAU for repeat BHCG. Patient denies any pain but does have a little spotting.  

## 2011-10-06 NOTE — MAU Provider Note (Signed)
Chart reviewed and agree with management and plan.  

## 2011-10-07 ENCOUNTER — Telehealth (HOSPITAL_COMMUNITY): Payer: Self-pay | Admitting: Nurse Practitioner

## 2011-10-07 ENCOUNTER — Inpatient Hospital Stay (HOSPITAL_COMMUNITY)
Admission: AD | Admit: 2011-10-07 | Discharge: 2011-10-07 | Disposition: A | Discharge status: Home / Self Care | Payer: Medicaid Other | Source: Ambulatory Visit | Attending: Obstetrics & Gynecology | Admitting: Obstetrics & Gynecology

## 2011-10-07 DIAGNOSIS — A5619 Other chlamydial genitourinary infection: Secondary | ICD-10-CM | POA: Insufficient documentation

## 2011-10-07 DIAGNOSIS — N739 Female pelvic inflammatory disease, unspecified: Secondary | ICD-10-CM | POA: Insufficient documentation

## 2011-10-07 DIAGNOSIS — A749 Chlamydial infection, unspecified: Secondary | ICD-10-CM

## 2011-10-07 MED ORDER — AZITHROMYCIN 250 MG PO TABS: 1000.0000 mg | ORAL_TABLET | Freq: Once | ORAL | Status: AC

## 2011-10-07 NOTE — ED Notes (Addendum)
Patient treated at  [facility].

## 2011-10-07 NOTE — Telephone Encounter (Signed)
Lab called.  Cervical swab negative.  Urine positive for chlamydia.  Lab unable to confirm results and requests client be retested at no charge per lab supervisor, Marina Goodell.  Left message for client to call.  Any specimen needs to be sent to the lab to the attention of Marina Goodell for retesting at no charge.

## 2011-10-07 NOTE — Telephone Encounter (Signed)
Client was here earlier this week with a miscarriage.  Explained by phone that lab tests were inconclusive for chlamydia.  Advised to return today for retesting and treatment.  Client teary on the phone but will come today before 6 pm.

## 2011-10-07 NOTE — ED Notes (Signed)
Unable to contact via phone. Patient admitted to [facility] on 10/06/2011.

## 2011-10-07 NOTE — MAU Provider Note (Signed)
Called from Lab supervisor today.  Inconclusive chlamydia in urine.  Cervical specimen was negative on first day of collection.  Supervisor requested client return for repeat testing.  Client called and came in today for retesting.  However, she was here on yesterday and cervical retest done - results not completed today.    A:  Chlamydia P:  Azithromycin 1 gm po single dose  - rx given urine collected for gc/chlam testing Client to follow up with Dr. Emelda Fear on 10-14-11. Advised no sex x 7 days and to have partner tested and treated. Client voiced understanding of instructions.

## 2011-10-08 ENCOUNTER — Telehealth: Payer: Self-pay

## 2011-10-08 LAB — GC/CHLAMYDIA PROBE AMP, GENITAL: Chlamydia, DNA Probe: POSITIVE — AB

## 2011-10-08 LAB — GC/CHLAMYDIA PROBE AMP, URINE: GC Probe Amp, Urine: NEGATIVE

## 2011-10-08 NOTE — Telephone Encounter (Addendum)
Called pt and left message to return our call to th clinics. Re: Pt is not a pt of the clinics.  Pt needs to be informed of positive chlamydia and to be tx'd @ [facility].  STD sheet has been filled out and faxed.

## 2011-10-08 NOTE — Telephone Encounter (Signed)
Message copied by Faythe Casa on Thu Oct 08, 2011  4:05 PM ------      Message from: Darrel Hoover      Created: Thu Oct 08, 2011  9:49 AM      Regarding: Notify of results       If patient has been seen at one of our offices please offer treatment here.  If not our patient, give results refer to health dept for treatment and fill out STD sheet and fax to health dept.      THanks,      kelly      ----- Message -----         From: Lab In [place] Interface         Sent: 10/08/2011   9:45 AM           To: Stoney Bang Results

## 2011-10-14 NOTE — Telephone Encounter (Signed)
Per Tresa Endo after second attempt and we are able to leave a message and sent STD card to [facility] we can close encounter.

## 2011-10-14 NOTE — Telephone Encounter (Signed)
Called pt and left message that this is our second attempt to please give the clinics a call.

## 2011-11-20 ENCOUNTER — Inpatient Hospital Stay (HOSPITAL_COMMUNITY)
Admission: AD | Admit: 2011-11-20 | Discharge: 2011-11-20 | Disposition: A | Discharge status: Home / Self Care | Payer: Medicaid Other | Source: Ambulatory Visit | Attending: Obstetrics & Gynecology | Admitting: Obstetrics & Gynecology

## 2011-11-20 ENCOUNTER — Encounter (HOSPITAL_COMMUNITY): Payer: Self-pay

## 2011-11-20 DIAGNOSIS — R350 Frequency of micturition: Secondary | ICD-10-CM | POA: Insufficient documentation

## 2011-11-20 DIAGNOSIS — A5619 Other chlamydial genitourinary infection: Secondary | ICD-10-CM | POA: Insufficient documentation

## 2011-11-20 DIAGNOSIS — A749 Chlamydial infection, unspecified: Secondary | ICD-10-CM

## 2011-11-20 DIAGNOSIS — N739 Female pelvic inflammatory disease, unspecified: Secondary | ICD-10-CM | POA: Insufficient documentation

## 2011-11-20 DIAGNOSIS — N390 Urinary tract infection, site not specified: Secondary | ICD-10-CM

## 2011-11-20 DIAGNOSIS — Z3202 Encounter for pregnancy test, result negative: Secondary | ICD-10-CM | POA: Insufficient documentation

## 2011-11-20 LAB — URINALYSIS, ROUTINE W REFLEX MICROSCOPIC
Protein, ur: 100 mg/dL — AB
Urobilinogen, UA: 0.2 mg/dL (ref 0.0–1.0)

## 2011-11-20 LAB — URINE MICROSCOPIC-ADD ON

## 2011-11-20 LAB — POCT PREGNANCY, URINE: Preg Test, Ur: NEGATIVE

## 2011-11-20 MED ORDER — SULFAMETHOXAZOLE-TRIMETHOPRIM 800-160 MG PO TABS: 1.0000 | ORAL_TABLET | Freq: Two times a day (BID) | ORAL | Status: AC

## 2011-11-20 MED ORDER — AZITHROMYCIN 250 MG PO TABS
1000.0000 mg | ORAL_TABLET | Freq: Once | ORAL | Status: AC
  Administered 2011-11-20: 1000 mg via ORAL
  Filled 2011-11-20: qty 4

## 2011-11-20 MED ORDER — CEFTRIAXONE SODIUM 250 MG IJ SOLR
250.0000 mg | Freq: Once | INTRAMUSCULAR | Status: AC
  Administered 2011-11-20: 250 mg via INTRAMUSCULAR
  Filled 2011-11-20: qty 250

## 2011-11-20 NOTE — MAU Note (Signed)
Patient states she had a miscarriage in May. Last period 6-13. Now having urinary frequency and cramping like she did when she was pregnant before. Wants a pregnancy test.

## 2011-11-20 NOTE — MAU Provider Note (Signed)
History     CSN: [id number]  Arrival date & time 11/20/11  1208   None     Chief Complaint  Patient presents with  . Possible Pregnancy  . Urinary Frequency  . Dysmenorrhea    HPI Linda Warner is a 21 y.o. female who presents to MAU for pregnancy test. She states that she had a miscarriage last month. It is not time until next week for her period but she has frequent urination and also wants treatment for Chlamydia. She tested positive and did not get her Rx filled. She denies any other problems.  Past Medical History  Diagnosis Date  . Acne   . Scoliosis   . Pregnant   . Scoliosis     Past Surgical History  Procedure Date  . No past surgeries     Family History  Problem Relation Age of Onset  . Depression Father   . Depression Maternal Grandmother   . Diabetes Maternal Grandmother   . Hyperlipidemia Maternal Grandmother   . Anesthesia problems Neg Hx     History  Substance Use Topics  . Smoking status: Former Games developer  . Smokeless tobacco: Never Used   Comment: quit prior to preg  . Alcohol Use: No    OB History    Grav Para Term Preterm Abortions TAB SAB Ect Mult Living   1               Review of Systems: As stated in HPI  Allergies  Review of patient's allergies indicates no known allergies.  Home Medications  No current outpatient prescriptions on file.  BP 135/72  Pulse 97  Temp 98.7 F (37.1 C) (Oral)  SpO2 100%  LMP 10/29/2011  Breastfeeding? Unknown  Physical Exam  Nursing note and vitals reviewed. Constitutional: She is oriented to person, place, and time. She appears well-developed and well-nourished. No distress.  HENT:  Head: Normocephalic.  Eyes: EOM are normal.  Neck: Neck supple.  Cardiovascular: Normal rate.   Pulmonary/Chest: Effort normal.  Abdominal: Soft. There is tenderness in the suprapubic area. There is no rigidity, no rebound, no guarding and no CVA tenderness.  Musculoskeletal: Normal range of motion.    Neurological: She is alert and oriented to person, place, and time. No cranial nerve deficit.  Skin: Skin is warm and dry.  Psychiatric: She has a normal mood and affect. Her behavior is normal. Judgment and thought content normal.   Results for orders placed during the hospital encounter of 11/20/11 (from the past 24 hour(s))  URINALYSIS, ROUTINE W REFLEX MICROSCOPIC     Status: Abnormal   Collection Time   11/20/11 12:26 PM      Component Value Range   Color, Urine YELLOW  YELLOW   APPearance CLOUDY (*) CLEAR   Specific Gravity, Urine 1.020  1.005 - 1.030   pH 7.0  5.0 - 8.0   Glucose, UA NEGATIVE  NEGATIVE mg/dL   Hgb urine dipstick MODERATE (*) NEGATIVE   Bilirubin Urine NEGATIVE  NEGATIVE   Ketones, ur NEGATIVE  NEGATIVE mg/dL   Protein, ur 409 (*) NEGATIVE mg/dL   Urobilinogen, UA 0.2  0.0 - 1.0 mg/dL   Nitrite NEGATIVE  NEGATIVE   Leukocytes, UA MODERATE (*) NEGATIVE  URINE MICROSCOPIC-ADD ON     Status: Abnormal   Collection Time   11/20/11 12:26 PM      Component Value Range   Squamous Epithelial / LPF FEW (*) RARE   WBC, UA TOO NUMEROUS TO  COUNT  <3 WBC/hpf   RBC / HPF TOO NUMEROUS TO COUNT  <3 RBC/hpf   Bacteria, UA FEW (*) RARE  POCT PREGNANCY, URINE     Status: Normal   Collection Time   11/20/11 12:32 PM      Component Value Range   Preg Test, Ur NEGATIVE  NEGATIVE   Assessment: UTI   Negative pregnancy test   Chlamydia untreated  Plan:  Rocephin 250 mg IM   Zithromax 1 gram po   Septra DS Rx    Return as needed I have reviewed this patient's vital signs, nurses notes, previous visits and appropriate labs. I have discussed findings in detail with the patient and need for partner treatment of the Chlamydia. Patient voices understanding. ED Course  Procedures  MDM

## 2011-11-21 NOTE — MAU Provider Note (Signed)
Attestation of Attending Supervision of Advanced Practitioner (CNM/NP): Evaluation and management procedures were performed by the Advanced Practitioner under my supervision and collaboration.  I have reviewed the Advanced Practitioner's note and chart, and I agree with the management and plan.  Rumaldo Difatta, M.D. 11/21/2011 8:30 AM  

## 2011-12-07 ENCOUNTER — Ambulatory Visit (INDEPENDENT_AMBULATORY_CARE_PROVIDER_SITE_OTHER): Payer: Self-pay | Admitting: Family Medicine

## 2011-12-07 ENCOUNTER — Encounter: Payer: Self-pay | Admitting: Family Medicine

## 2011-12-07 VITALS — BP 113/71 | HR 78 | Wt 145.0 lb

## 2011-12-07 DIAGNOSIS — F329 Major depressive disorder, single episode, unspecified: Secondary | ICD-10-CM

## 2011-12-07 MED ORDER — FLUOXETINE HCL 10 MG PO CAPS: ORAL_CAPSULE | ORAL | Status: DC
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
## 2011-12-29 ENCOUNTER — Ambulatory Visit: Payer: Medicaid Other | Admitting: Family Medicine

## 2011-12-29 DIAGNOSIS — Z0289 Encounter for other administrative examinations: Secondary | ICD-10-CM

## 2012-05-17 ENCOUNTER — Emergency Department (HOSPITAL_COMMUNITY)
Admission: EM | Admit: 2012-05-17 | Discharge: 2012-05-17 | Disposition: A | Discharge status: Home / Self Care | Payer: Medicaid Other | Attending: Emergency Medicine | Admitting: Emergency Medicine

## 2012-05-17 ENCOUNTER — Encounter (HOSPITAL_COMMUNITY): Payer: Self-pay | Admitting: *Deleted

## 2012-05-17 ENCOUNTER — Emergency Department (HOSPITAL_COMMUNITY): Payer: Medicaid Other

## 2012-05-17 DIAGNOSIS — N6459 Other signs and symptoms in breast: Secondary | ICD-10-CM | POA: Insufficient documentation

## 2012-05-17 DIAGNOSIS — M4120 Other idiopathic scoliosis, site unspecified: Secondary | ICD-10-CM | POA: Insufficient documentation

## 2012-05-17 DIAGNOSIS — N898 Other specified noninflammatory disorders of vagina: Secondary | ICD-10-CM | POA: Insufficient documentation

## 2012-05-17 DIAGNOSIS — Z8619 Personal history of other infectious and parasitic diseases: Secondary | ICD-10-CM | POA: Insufficient documentation

## 2012-05-17 DIAGNOSIS — Z8742 Personal history of other diseases of the female genital tract: Secondary | ICD-10-CM | POA: Insufficient documentation

## 2012-05-17 DIAGNOSIS — Z872 Personal history of diseases of the skin and subcutaneous tissue: Secondary | ICD-10-CM | POA: Insufficient documentation

## 2012-05-17 DIAGNOSIS — B9689 Other specified bacterial agents as the cause of diseases classified elsewhere: Secondary | ICD-10-CM

## 2012-05-17 DIAGNOSIS — Z3490 Encounter for supervision of normal pregnancy, unspecified, unspecified trimester: Secondary | ICD-10-CM

## 2012-05-17 DIAGNOSIS — Z3201 Encounter for pregnancy test, result positive: Secondary | ICD-10-CM | POA: Insufficient documentation

## 2012-05-17 DIAGNOSIS — N760 Acute vaginitis: Secondary | ICD-10-CM | POA: Insufficient documentation

## 2012-05-17 LAB — BASIC METABOLIC PANEL
BUN: 10 mg/dL (ref 6–23)
Creatinine, Ser: 0.64 mg/dL (ref 0.50–1.10)
GFR calc Af Amer: >90 mL/min (ref >90)
GFR calc non Af Amer: >90 mL/min (ref >90)
Glucose, Bld: 88 mg/dL (ref 70–99)

## 2012-05-17 LAB — CBC WITH DIFFERENTIAL/PLATELET
Basophils Relative: 1 % (ref 0–1)
Eosinophils Absolute: 0.2 10*3/uL (ref 0.0–0.7)
Eosinophils Relative: 4 % (ref 0–5)
HCT: 34.4 % — ABNORMAL LOW (ref 36.0–46.0)
Hemoglobin: 12.2 g/dL (ref 12.0–15.0)
Lymphs Abs: 2.2 10*3/uL (ref 0.7–4.0)
MCH: 28.9 pg (ref 26.0–34.0)
MCHC: 35.5 g/dL (ref 30.0–36.0)
MCV: 81.5 fL (ref 78.0–100.0)
Monocytes Absolute: 0.3 10*3/uL (ref 0.1–1.0)
Monocytes Relative: 7 % (ref 3–12)

## 2012-05-17 LAB — URINALYSIS, ROUTINE W REFLEX MICROSCOPIC
Bilirubin Urine: NEGATIVE
Nitrite: NEGATIVE
Specific Gravity, Urine: 1.025 (ref 1.005–1.030)
pH: 6.5 (ref 5.0–8.0)

## 2012-05-17 LAB — WET PREP, GENITAL

## 2012-05-17 LAB — PREGNANCY, URINE: Preg Test, Ur: POSITIVE — AB

## 2012-05-17 MED ORDER — PRENATAL COMPLETE 14-0.4 MG PO TABS: 1.0000 | ORAL_TABLET | Freq: Every day | ORAL | Status: DC

## 2012-05-17 MED ORDER — METRONIDAZOLE 500 MG PO TABS
2000.0000 mg | ORAL_TABLET | Freq: Once | ORAL | Status: AC
  Administered 2012-05-17: 2000 mg via ORAL
  Filled 2012-05-17: qty 4

## 2012-05-17 NOTE — ED Provider Notes (Signed)
History  This chart was scribed for Glynn Octave, MD by Shari Heritage, ED Scribe. The patient was seen in room APA01/APA01. Patient's care was started at 1244.  CSN: [id number]  Arrival date & time 05/17/12  1206   First MD Initiated Contact with Patient 05/17/12 1244      Chief Complaint  Patient presents with  . Possible Pregnancy    The history is provided by the patient. No language interpreter was used.    HPI Comments: Linda Warner is a 21 y.o. female who presents to the Emergency Department needing a pregnancy test and STD screen. Patient is also complaining of moderate, constant abdominal cramping and mild breast soreness. There is associated mild vaginal discharge. Patient says she expected hef period last week, but is now 2 weeks late. Patient states that she has also had an increased number of bowel movements. Patient denies back pain, abnormal vaginal bleeding, flank pain, dysuria, urinary frequency or vaginal pain. Patient states that she had a miscarriage in May 2013 due to chlamydia and wants to make sure that the pregnancy is viable. Patient has a medical history of scoliosis. LMP 04/10/12.   OB Emelda Fear   Past Medical History  Diagnosis Date  . Acne   . Scoliosis   . Pregnant   . Scoliosis     Past Surgical History  Procedure Date  . No past surgeries     Family History  Problem Relation Age of Onset  . Depression Father   . Depression Maternal Grandmother   . Diabetes Maternal Grandmother   . Hyperlipidemia Maternal Grandmother   . Anesthesia problems Neg Hx     History  Substance Use Topics  . Smoking status: Never Smoker   . Smokeless tobacco: Never Used  . Alcohol Use: No    OB History    Grav Para Term Preterm Abortions TAB SAB Ect Mult Living   1               Review of Systems A complete 10 system review of systems was obtained and all systems are negative except as noted in the HPI and PMH.   Allergies  Review of patient's  allergies indicates no known allergies.  Home Medications   No current outpatient prescriptions on file.  Triage Vitals: BP 115/64  Pulse 80  Temp 98 F (36.7 C) (Oral)  Resp 16  Ht 5\' 3"  (1.6 m)  Wt 147 lb (66.679 kg)  BMI 26.04 kg/m2  SpO2 100%  LMP 04/10/2012  Physical Exam  Constitutional: She is oriented to person, place, and time. She appears well-developed and well-nourished.  HENT:  Head: Normocephalic and atraumatic.  Eyes: Conjunctivae normal are normal.  Neck: Neck supple.  Cardiovascular: Normal rate and regular rhythm.   No murmur heard. Pulmonary/Chest: Effort normal and breath sounds normal. No respiratory distress. She has no wheezes. She has no rales.  Abdominal: Soft. Bowel sounds are normal. She exhibits no distension. There is no tenderness. There is no rebound, no guarding and no CVA tenderness.  Genitourinary: There is no rash or tenderness on the right labia. There is no rash or tenderness on the left labia. Cervix exhibits no motion tenderness. Right adnexum displays no mass and no tenderness. Left adnexum displays no mass and no tenderness.  Musculoskeletal: Normal range of motion. She exhibits no edema and no tenderness.  Neurological: She is alert and oriented to person, place, and time.  Skin: Skin is warm and dry. No rash  noted.  Psychiatric: She has a normal mood and affect. Her behavior is normal.    ED Course  Procedures (including critical care time) DIAGNOSTIC STUDIES: Oxygen Saturation is 100% on room air, normal by my interpretation.    COORDINATION OF CARE: 12:49 PM- Patient informed of current plan for treatment and evaluation and agrees with plan at this time.    Labs Reviewed  PREGNANCY, URINE - Abnormal; Notable for the following:    Preg Test, Ur POSITIVE (*)     All other components within normal limits  CBC WITH DIFFERENTIAL - Abnormal; Notable for the following:    HCT 34.4 (*)     Neutrophils Relative 40 (*)      Lymphocytes Relative 48 (*)     All other components within normal limits  HCG, QUANTITATIVE, PREGNANCY - Abnormal; Notable for the following:    hCG, Beta Chain, Quant, S 10661 (*)     All other components within normal limits  URINALYSIS, ROUTINE W REFLEX MICROSCOPIC  BASIC METABOLIC PANEL  ABO/RH  GC/CHLAMYDIA PROBE AMP  WET PREP, GENITAL   US Ob Comp Less 14 Wks  05/17/2012  *RADIOLOGY REPORT*  Clinical Data: POSSIBLE PREGNANCY,possible pregnancy; ;  OBSTETRIC <14 WK [place] AND TRANSVAGINAL OB US  Technique: Both transabdominal and transvaginal ultrasound examinations were performed for complete evaluation of the gestation as well as the maternal uterus, adnexal regions, and pelvic cul-de-sac.  Comparison: None.  Findings: There is a single intrauterine gestation.  Based on mean sac diameter of 1.49 cm, estimated gestational age is 6 weeks 2 days.  Yolk sac is present.  No visualized fetal pole currently. Small subchorionic hemorrhage.  Right ovary is unremarkable.  Complex hypoechoic area noted within the left ovary, likely hemorrhagic cyst measuring up to 2.3 cm. Trace free fluid in the pelvis.  IMPRESSION: Early intrauterine pregnancy, 6 weeks 2 days by mean sac diameter. No fetal pole currently.  Recommend follow-up ultrasound in 7-10 days. Small subchorionic hemorrhage.  Complex hypoechoic area in the left ovary measures 2.3 cm, likely hemorrhagic cyst.   Original Report Authenticated By: Charlett Nose, M.D.    US Ob Transvaginal  05/17/2012  *RADIOLOGY REPORT*  Clinical Data: POSSIBLE PREGNANCY,possible pregnancy; ;  OBSTETRIC <14 WK [place] AND TRANSVAGINAL OB US  Technique: Both transabdominal and transvaginal ultrasound examinations were performed for complete evaluation of the gestation as well as the maternal uterus, adnexal regions, and pelvic cul-de-sac.  Comparison: None.  Findings: There is a single intrauterine gestation.  Based on mean sac diameter of 1.49 cm, estimated gestational age is  6 weeks 2 days.  Yolk sac is present.  No visualized fetal pole currently. Small subchorionic hemorrhage.  Right ovary is unremarkable.  Complex hypoechoic area noted within the left ovary, likely hemorrhagic cyst measuring up to 2.3 cm. Trace free fluid in the pelvis.  IMPRESSION: Early intrauterine pregnancy, 6 weeks 2 days by mean sac diameter. No fetal pole currently.  Recommend follow-up ultrasound in 7-10 days. Small subchorionic hemorrhage.  Complex hypoechoic area in the left ovary measures 2.3 cm, likely hemorrhagic cyst.   Original Report Authenticated By: Charlett Nose, M.D.      No diagnosis found.    MDM  Patient complains of lower abdominal cramping and late period. History of miscarriage in May 2013. States pregnancy test positive yesterday last period November 24.  Abdomen soft and nontender. Pelvic exam benign. GC/chalmydia swabs sent. Intrauterine gestational sac seen without fetal pole. Approximately [redacted] weeks gestation. Repeat ultrasound recommended  one week. Discussed with Dr. Emelda Fear who will see her in clinic.    I personally performed the services described in this documentation, which was scribed in my presence. The recorded information has been reviewed and is accurate.    Glynn Octave, MD 05/17/12 [id number]

## 2012-05-17 NOTE — ED Notes (Signed)
Took home pregnancy test yesterday with positive result.  States wants to be checked again.  Had miscarriage in May 2013 due to STD and wants to be checked for that as well.  C/o abd cramping at this time with mild vaginal discharge.

## 2012-05-17 NOTE — ED Notes (Signed)
Pt presents to ED secondary to lower abdominal pain that radiate into back. Pt states took home pregnancy test with positive results due to period late 1 week. Pt reports history of miscarriage in MAY 2013. Pt states has clear vaginal discharge, no odor and urinary frequency x 2 weeks. NAD noted.

## 2012-05-18 NOTE — L&D Delivery Note (Signed)
Delivery Note  Patient progressed well to complete dilation with pitocin for labor augmentation.  At 2:50 AM a viable and healthy female was delivered via Vaginal, Spontaneous Delivery (Presentation: ROA  ).  APGAR: 9, 9; weight pending.  Cord around neck x 1- reduced prior to del. Placenta status: Intact, Spontaneous.  Cord: 3 vessels with the following complications: None.  Cord pH: n/a  Anesthesia: Epidural  Episiotomy: None Lacerations: 1st degree;Vaginal Suture Repair: 3.0 vicryl Est. Blood Loss (mL): 250  Mom to postpartum.  Baby to nursery-stable.  Levert Feinstein 01/07/2013, 3:17 AM  I have seen and examined this patient and I agree with the above. Was present for delivery. Cam Hai 3:31 AM 01/07/2013

## 2012-05-18 NOTE — L&D Delivery Note (Signed)
Attestation of Attending Supervision of Advanced Practitioner (PA/CNM/NP): Evaluation and management procedures were performed by the Advanced Practitioner under my supervision and collaboration.  I have reviewed the Advanced Practitioner's note and chart, and I agree with the management and plan.  Josecarlos Harriott, MD, FACOG Attending Obstetrician & Gynecologist Faculty Practice, [facility]  

## 2012-05-19 LAB — GC/CHLAMYDIA PROBE AMP
CT Probe RNA: NEGATIVE
GC Probe RNA: NEGATIVE

## 2012-05-30 LAB — OB RESULTS CONSOLE GC/CHLAMYDIA: Chlamydia: NEGATIVE

## 2012-05-30 LAB — OB RESULTS CONSOLE RPR: RPR: NONREACTIVE

## 2012-05-30 LAB — CYSTIC FIBROSIS DIAGNOSTIC STUDY: Interpretation-CFDNA:: NEGATIVE

## 2012-05-30 LAB — OB RESULTS CONSOLE ANTIBODY SCREEN: Antibody Screen: NEGATIVE

## 2012-05-30 LAB — SICKLE CELL SCREEN: Sickle Cell Screen: NEGATIVE

## 2012-06-11 ENCOUNTER — Inpatient Hospital Stay (HOSPITAL_COMMUNITY)
Admission: AD | Admit: 2012-06-11 | Discharge: 2012-06-11 | Disposition: A | Discharge status: Home / Self Care | Payer: Medicaid Other | Source: Ambulatory Visit | Attending: Obstetrics and Gynecology | Admitting: Obstetrics and Gynecology

## 2012-06-11 ENCOUNTER — Encounter (HOSPITAL_COMMUNITY): Payer: Self-pay | Admitting: Obstetrics and Gynecology

## 2012-06-11 ENCOUNTER — Inpatient Hospital Stay (HOSPITAL_COMMUNITY): Payer: Medicaid Other

## 2012-06-11 DIAGNOSIS — O418X90 Other specified disorders of amniotic fluid and membranes, unspecified trimester, not applicable or unspecified: Secondary | ICD-10-CM

## 2012-06-11 DIAGNOSIS — O209 Hemorrhage in early pregnancy, unspecified: Secondary | ICD-10-CM | POA: Insufficient documentation

## 2012-06-11 DIAGNOSIS — IMO0002 Reserved for concepts with insufficient information to code with codable children: Secondary | ICD-10-CM

## 2012-06-11 LAB — POCT PREGNANCY, URINE: Preg Test, Ur: POSITIVE — AB

## 2012-06-11 LAB — URINALYSIS, ROUTINE W REFLEX MICROSCOPIC
Glucose, UA: NEGATIVE mg/dL
Hgb urine dipstick: NEGATIVE
Ketones, ur: NEGATIVE mg/dL
Leukocytes, UA: NEGATIVE
pH: 7 (ref 5.0–8.0)

## 2012-06-11 NOTE — MAU Note (Signed)
Pt states that she is having brown discharge when she wipes. States that she is taking progesterone suppositories since she was 6 weeks. Very concerned with miscarriage due to having one last year

## 2012-06-11 NOTE — MAU Provider Note (Signed)
History     CSN: [id number]  Arrival date and time: 06/11/12 1612   First Provider Initiated Contact with Patient 06/11/12 1714      Chief Complaint  Patient presents with  . Vaginal Bleeding   HPI Linda Warner is a 22 y.o. female @ [redacted]w[redacted]d gestation who presents to MAU with vaginal bleeding. She was evaluated here on 05/17/12 and had an ultrasound that showed a 6.2 week IUGS but no Fetal Pole. She was to return in 2 weeks for follow up ultrasound. She did follow up with at Dr. Rayna Sexton office and was started on Progesterone Suppositories due to her previous miscarriage. Today she noted bleeding when she went to the bathroom. She describes the bleeding as brown discharge. The history was provided by the patient.   OB History    Grav Para Term Preterm Abortions TAB SAB Ect Mult Living   2    1  1          Past Medical History  Diagnosis Date  . Acne   . Scoliosis   . Pregnant   . Scoliosis     Past Surgical History  Procedure Date  . No past surgeries     Family History  Problem Relation Age of Onset  . Depression Father   . Depression Maternal Grandmother   . Diabetes Maternal Grandmother   . Hyperlipidemia Maternal Grandmother   . Anesthesia problems Neg Hx     History  Substance Use Topics  . Smoking status: Never Smoker   . Smokeless tobacco: Never Used  . Alcohol Use: No    Allergies: No Known Allergies  Prescriptions prior to admission  Medication Sig Dispense Refill  . Prenatal Vit-Fe Fumarate-FA (PRENATAL COMPLETE) 14-0.4 MG TABS Take 1 tablet by mouth daily.  60 each  0  . Progesterone 50 MG SUPP Place 1 suppository vaginally 2 (two) times daily.        Review of Systems  Constitutional: Negative for fever and chills.  Eyes: Negative for blurred vision and double vision.  Respiratory: Negative for cough and wheezing.   Cardiovascular: Negative for chest pain and leg swelling.  Gastrointestinal: Negative for nausea, abdominal pain, diarrhea and  constipation.  Genitourinary: Positive for frequency. Negative for dysuria and urgency.  Musculoskeletal: Positive for back pain.  Skin: Negative for rash.  Neurological: Negative for dizziness, seizures and headaches.  Psychiatric/Behavioral: Negative for depression. The patient is not nervous/anxious and does not have insomnia.    Blood pressure 135/79, pulse 99, temperature 98 F (36.7 C), temperature source Oral, resp. rate 18, height 5\' 3"  (1.6 m), weight 157 lb (71.215 kg), last menstrual period 04/10/2012, unknown if currently breastfeeding.  Physical Exam  Constitutional: She is oriented to person, place, and time. She appears well-developed and well-nourished. No distress.  HENT:  Head: Normocephalic and atraumatic.  Eyes: EOM are normal.  Neck: Neck supple.  Cardiovascular: Normal rate.   Respiratory: Effort normal.  GI: Soft. There is no tenderness.  Musculoskeletal: Normal range of motion.  Neurological: She is alert and oriented to person, place, and time.  Skin: Skin is warm and dry.  Psychiatric: She has a normal mood and affect. Her behavior is normal. Judgment and thought content normal.   Procedures US Ob Transvaginal  06/11/2012  *RADIOLOGY REPORT*  Clinical Data: 22 year old G2 P0 AB1, LMP 04/10/2012 (8 weeks 6 days - which is the established gestational age at the obstetrician's office), presenting with a brown vaginal discharge.  TRANSVAGINAL OBSTETRIC US <  14 WEEKS  Technique:  Transvaginal ultrasound was performed for complete evaluation of the gestation as well as the maternal uterus, adnexal regions, and pelvic cul-de-sac.  Comparison:  05/17/2012.  Intrauterine gestational sac: Single, normal in shape. Yolk sac: Visualized. Embryo: Visualized. Cardiac Activity: Visualized. Heart Rate: 172 beats per minute  CRL: 25.4 mm           9 w  3 d           [place] EDC: 01/11/2013.  Subchorionic hemorrhage: Small, measuring approximately 1.2 x 1.3 x 2.0 cm.  Maternal  uterus/adnexae: Normal-appearing right ovary containing small follicles measuring approximately 3.1 x 1.7 x 1.8 cm.  Normal color Doppler flow. Nonvisualization of the left ovary.  Normal-appearing closed cervix.  IMPRESSION:  1.  Single live intrauterine gestation with estimated gestational age [redacted] weeks 3 days by crown-rump length.  Assigned gestational age is 8 weeks 6 days. 2.  Small subchorionic hemorrhage. 3.  Normal-appearing right ovary.  Nonvisualization of the left ovary.   Original Report Authenticated By: Hulan Saas, M.D.     Assessment: 22 y.o. female @ [redacted]w[redacted]d gestation with vaginal bleeding   [facility]  Plan:  Pelvic rest   Continue Progesterone   Follow up in the office  Discussed with the patient and all questioned fully answered. She will return if any problems arise.     NEESE,HOPE, RN, FNP, [facility] 06/11/2012, 5:14 PM

## 2012-06-11 NOTE — MAU Provider Note (Signed)
Attestation of Attending Supervision of Advanced Practitioner (CNM/NP): Evaluation and management procedures were performed by the Advanced Practitioner under my supervision and collaboration.  I have reviewed the Advanced Practitioner's note and chart, and I agree with the management and plan.  Judythe Postema 06/11/2012 9:12 PM

## 2012-06-11 NOTE — MAU Note (Signed)
"  I noted brown spotting around 1400 today..When I wiped it wasn't there anymore.  When I returned back to the BR, it was there on the pad a little bit.  It was like this when I had my last SAB, so I am just a little worried.  I've been on the progesterone suppositories since 6 wks.  I have a doctor's appt on 06/20/12 and I was going to inform them about some itching down there that I have had.  I didn't think it was a yeast infection, because it wasn't that bad."

## 2012-07-02 ENCOUNTER — Other Ambulatory Visit: Payer: Self-pay

## 2012-07-26 ENCOUNTER — Encounter: Payer: Self-pay | Admitting: *Deleted

## 2012-08-02 ENCOUNTER — Encounter: Payer: Self-pay | Admitting: Advanced Practice Midwife

## 2012-08-02 ENCOUNTER — Ambulatory Visit (INDEPENDENT_AMBULATORY_CARE_PROVIDER_SITE_OTHER): Payer: Medicaid Other | Admitting: Advanced Practice Midwife

## 2012-08-02 ENCOUNTER — Other Ambulatory Visit: Payer: Self-pay | Admitting: Advanced Practice Midwife

## 2012-08-02 VITALS — BP 110/68 | Wt 175.6 lb

## 2012-08-02 LAB — POCT URINALYSIS DIPSTICK
Leukocytes, UA: NEGATIVE
Nitrite, UA: NEGATIVE
Protein, UA: NEGATIVE

## 2012-08-02 NOTE — Progress Notes (Signed)
Pt reports no movement.  No c/o at this time. Weight warning and tips given.  Routine questions about pregnancy andswered.  F/U in 3 weeks for anatomy scan ultrasound.

## 2012-08-07 LAB — MATERNAL SCREEN, INTEGRATED #2
Crown Rump Length: 63.9 mm
Estriol Mom: 0.47
Inhibin A Dimeric: 195 pg/mL
MSS Trisomy 18 Risk: 1:3300 {titer}
Maternal weight: 175 [lb_av]
Number of fetuses: 1
PAPP-A MoM: 0.29
Referring Physician NPI: 1881783975
Rish for ONTD: <1:5000 {titer}
hCG, Serum: 38.2 IU/mL

## 2012-08-11 ENCOUNTER — Encounter: Payer: Self-pay | Admitting: Adult Health

## 2012-08-12 ENCOUNTER — Telehealth: Payer: Self-pay | Admitting: Adult Health

## 2012-08-12 ENCOUNTER — Telehealth: Payer: Self-pay | Admitting: Obstetrics and Gynecology

## 2012-08-12 NOTE — Telephone Encounter (Signed)
Pt informed of IT results, appt made next week for Harmony test and u/s per Cyril Mourning, NP.

## 2012-08-12 NOTE — Telephone Encounter (Signed)
Patient contacting office to discuss results from IT, received letter in the mail to call office. Informed Pt will speak with provider and return call.

## 2012-08-16 ENCOUNTER — Ambulatory Visit (INDEPENDENT_AMBULATORY_CARE_PROVIDER_SITE_OTHER): Payer: Medicaid Other

## 2012-08-16 DIAGNOSIS — O418X21 Other specified disorders of amniotic fluid and membranes, second trimester, fetus 1: Secondary | ICD-10-CM

## 2012-08-16 DIAGNOSIS — Z3492 Encounter for supervision of normal pregnancy, unspecified, second trimester: Secondary | ICD-10-CM

## 2012-08-16 DIAGNOSIS — O289 Unspecified abnormal findings on antenatal screening of mother: Secondary | ICD-10-CM

## 2012-08-16 DIAGNOSIS — Z3490 Encounter for supervision of normal pregnancy, unspecified, unspecified trimester: Secondary | ICD-10-CM

## 2012-08-16 LAB — US OB DETAIL + 14 WK

## 2012-08-16 NOTE — Progress Notes (Signed)
U/S-active fetus, meas c/w LMP dates, no major abnl noted, post gr 0 plac, cx long and closed, fluid wnl, bilateral adnexa wnl, female fetus (Pt states she wants to wait and discuss Harmony testing with Dr. Despina Hidden on 08/23/12 ob visit)

## 2012-08-23 ENCOUNTER — Encounter: Payer: Self-pay | Admitting: Obstetrics & Gynecology

## 2012-08-23 ENCOUNTER — Other Ambulatory Visit: Payer: Medicaid Other

## 2012-08-23 ENCOUNTER — Ambulatory Visit (INDEPENDENT_AMBULATORY_CARE_PROVIDER_SITE_OTHER): Payer: Medicaid Other | Admitting: Obstetrics & Gynecology

## 2012-08-23 VITALS — BP 120/70 | Wt 179.0 lb

## 2012-08-23 DIAGNOSIS — Z3482 Encounter for supervision of other normal pregnancy, second trimester: Secondary | ICD-10-CM

## 2012-08-23 DIAGNOSIS — Z1389 Encounter for screening for other disorder: Secondary | ICD-10-CM

## 2012-08-23 DIAGNOSIS — Z3492 Encounter for supervision of normal pregnancy, unspecified, second trimester: Secondary | ICD-10-CM

## 2012-08-23 DIAGNOSIS — Z331 Pregnant state, incidental: Secondary | ICD-10-CM

## 2012-08-23 DIAGNOSIS — O09299 Supervision of pregnancy with other poor reproductive or obstetric history, unspecified trimester: Secondary | ICD-10-CM

## 2012-08-23 LAB — POCT URINALYSIS DIPSTICK
Glucose, UA: NEGATIVE
Ketones, UA: NEGATIVE

## 2012-08-23 MED ORDER — PRENATAL COMPLETE 14-0.4 MG PO TABS: 1.0000 | ORAL_TABLET | Freq: Every day | ORAL | Status: DC

## 2012-08-23 NOTE — Progress Notes (Signed)
Having sharpe pain rt. Side of stomach.

## 2012-08-23 NOTE — Addendum Note (Signed)
Addended by: Lazaro Arms on: 08/23/2012 11:24 AM   Modules accepted: Orders

## 2012-08-23 NOTE — Patient Instructions (Signed)
Pregnancy - Second Trimester The second trimester of pregnancy (3 to 6 months) is a period of rapid growth for you and your baby. At the end of the sixth month, your baby is about 9 inches long and weighs 1 1/2 pounds. You will begin to feel the baby move between 18 and 20 weeks of the pregnancy. This is called quickening. Weight gain is faster. A clear fluid (colostrum) may leak out of your breasts. You may feel small contractions of the womb (uterus). This is known as false labor or Braxton-Hicks contractions. This is like a practice for labor when the baby is ready to be born. Usually, the problems with morning sickness have usually passed by the end of your first trimester. Some women develop small dark blotches (called cholasma, mask of pregnancy) on their face that usually goes away after the baby is born. Exposure to the sun makes the blotches worse. Acne may also develop in some pregnant women and pregnant women who have acne, may find that it goes away. PRENATAL EXAMS  Blood work may continue to be done during prenatal exams. These tests are done to check on your health and the probable health of your baby. Blood work is used to follow your blood levels (hemoglobin). Anemia (low hemoglobin) is common during pregnancy. Iron and vitamins are given to help prevent this. You will also be checked for diabetes between 24 and 28 weeks of the pregnancy. Some of the previous blood tests may be repeated.  The size of the uterus is measured during each visit. This is to make sure that the baby is continuing to grow properly according to the dates of the pregnancy.  Your blood pressure is checked every prenatal visit. This is to make sure you are not getting toxemia.  Your urine is checked to make sure you do not have an infection, diabetes or protein in the urine.  Your weight is checked often to make sure gains are happening at the suggested rate. This is to ensure that both you and your baby are growing  normally.  Sometimes, an ultrasound is performed to confirm the proper growth and development of the baby. This is a test which bounces harmless sound waves off the baby so your caregiver can more accurately determine due dates. Sometimes, a specialized test is done on the amniotic fluid surrounding the baby. This test is called an amniocentesis. The amniotic fluid is obtained by sticking a needle into the belly (abdomen). This is done to check the chromosomes in instances where there is a concern about possible genetic problems with the baby. It is also sometimes done near the end of pregnancy if an early delivery is required. In this case, it is done to help make sure the baby's lungs are mature enough for the baby to live outside of the womb. CHANGES OCCURING IN THE SECOND TRIMESTER OF PREGNANCY Your body goes through many changes during pregnancy. They vary from person to person. Talk to your caregiver about changes you notice that you are concerned about.  During the second trimester, you will likely have an increase in your appetite. It is normal to have cravings for certain foods. This varies from person to person and pregnancy to pregnancy.  Your lower abdomen will begin to bulge.  You may have to urinate more often because the uterus and baby are pressing on your bladder. It is also common to get more bladder infections during pregnancy (pain with urination). You can help this by   drinking lots of fluids and emptying your bladder before and after intercourse.  You may begin to get stretch marks on your hips, abdomen, and breasts. These are normal changes in the body during pregnancy. There are no exercises or medications to take that prevent this change.  You may begin to develop swollen and bulging veins (varicose veins) in your legs. Wearing support hose, elevating your feet for 15 minutes, 3 to 4 times a day and limiting salt in your diet helps lessen the problem.  Heartburn may develop  as the uterus grows and pushes up against the stomach. Antacids recommended by your caregiver helps with this problem. Also, eating smaller meals 4 to 5 times a day helps.  Constipation can be treated with a stool softener or adding bulk to your diet. Drinking lots of fluids, vegetables, fruits, and whole grains are helpful.  Exercising is also helpful. If you have been very active up until your pregnancy, most of these activities can be continued during your pregnancy. If you have been less active, it is helpful to start an exercise program such as walking.  Hemorrhoids (varicose veins in the rectum) may develop at the end of the second trimester. Warm sitz baths and hemorrhoid cream recommended by your caregiver helps hemorrhoid problems.  Backaches may develop during this time of your pregnancy. Avoid heavy lifting, wear low heal shoes and practice good posture to help with backache problems.  Some pregnant women develop tingling and numbness of their hand and fingers because of swelling and tightening of ligaments in the wrist (carpel tunnel syndrome). This goes away after the baby is born.  As your breasts enlarge, you may have to get a bigger bra. Get a comfortable, cotton, support bra. Do not get a nursing bra until the last month of the pregnancy if you will be nursing the baby.  You may get a dark line from your belly button to the pubic area called the linea nigra.  You may develop rosy cheeks because of increase blood flow to the face.  You may develop spider looking lines of the face, neck, arms and chest. These go away after the baby is born. HOME CARE INSTRUCTIONS   It is extremely important to avoid all smoking, herbs, alcohol, and unprescribed drugs during your pregnancy. These chemicals affect the formation and growth of the baby. Avoid these chemicals throughout the pregnancy to ensure the delivery of a healthy infant.  Most of your home care instructions are the same as  suggested for the first trimester of your pregnancy. Keep your caregiver's appointments. Follow your caregiver's instructions regarding medication use, exercise and diet.  During pregnancy, you are providing food for you and your baby. Continue to eat regular, well-balanced meals. Choose foods such as meat, fish, milk and other low fat dairy products, vegetables, fruits, and whole-grain breads and cereals. Your caregiver will tell you of the ideal weight gain.  A physical sexual relationship may be continued up until near the end of pregnancy if there are no other problems. Problems could include early (premature) leaking of amniotic fluid from the membranes, vaginal bleeding, abdominal pain, or other medical or pregnancy problems.  Exercise regularly if there are no restrictions. Check with your caregiver if you are unsure of the safety of some of your exercises. The greatest weight gain will occur in the last 2 trimesters of pregnancy. Exercise will help you:  Control your weight.  Get you in shape for labor and delivery.  Lose weight   after you have the baby.  Wear a good support or jogging bra for breast tenderness during pregnancy. This may help if worn during sleep. Pads or tissues may be used in the bra if you are leaking colostrum.  Do not use hot tubs, steam rooms or saunas throughout the pregnancy.  Wear your seat belt at all times when driving. This protects you and your baby if you are in an accident.  Avoid raw meat, uncooked cheese, cat litter boxes and soil used by cats. These carry germs that can cause birth defects in the baby.  The second trimester is also a good time to visit your dentist for your dental health if this has not been done yet. Getting your teeth cleaned is OK. Use a soft toothbrush. Brush gently during pregnancy.  It is easier to loose urine during pregnancy. Tightening up and strengthening the pelvic muscles will help with this problem. Practice stopping your  urination while you are going to the bathroom. These are the same muscles you need to strengthen. It is also the muscles you would use as if you were trying to stop from passing gas. You can practice tightening these muscles up 10 times a set and repeating this about 3 times per day. Once you know what muscles to tighten up, do not perform these exercises during urination. It is more likely to contribute to an infection by backing up the urine.  Ask for help if you have financial, counseling or nutritional needs during pregnancy. Your caregiver will be able to offer counseling for these needs as well as refer you for other special needs.  Your skin may become oily. If so, wash your face with mild soap, use non-greasy moisturizer and oil or cream based makeup. MEDICATIONS AND DRUG USE IN PREGNANCY  Take prenatal vitamins as directed. The vitamin should contain 1 milligram of folic acid. Keep all vitamins out of reach of children. Only a couple vitamins or tablets containing iron may be fatal to a baby or young child when ingested.  Avoid use of all medications, including herbs, over-the-counter medications, not prescribed or suggested by your caregiver. Only take over-the-counter or prescription medicines for pain, discomfort, or fever as directed by your caregiver. Do not use aspirin.  Let your caregiver also know about herbs you may be using.  Alcohol is related to a number of birth defects. This includes fetal alcohol syndrome. All alcohol, in any form, should be avoided completely. Smoking will cause low birth rate and premature babies.  Street or illegal drugs are very harmful to the baby. They are absolutely forbidden. A baby born to an addicted mother will be addicted at birth. The baby will go through the same withdrawal an adult does. SEEK MEDICAL CARE IF:  You have any concerns or worries during your pregnancy. It is better to call with your questions if you feel they cannot wait, rather  than worry about them. SEEK IMMEDIATE MEDICAL CARE IF:   An unexplained oral temperature above 102 F (38.9 C) develops, or as your caregiver suggests.  You have leaking of fluid from the vagina (birth canal). If leaking membranes are suspected, take your temperature and tell your caregiver of this when you call.  There is vaginal spotting, bleeding, or passing clots. Tell your caregiver of the amount and how many pads are used. Light spotting in pregnancy is common, especially following intercourse.  You develop a bad smelling vaginal discharge with a change in the color from clear   to white.  You continue to feel sick to your stomach (nauseated) and have no relief from remedies suggested. You vomit blood or coffee ground-like materials.  You lose more than 2 pounds of weight or gain more than 2 pounds of weight over 1 week, or as suggested by your caregiver.  You notice swelling of your face, hands, feet, or legs.  You get exposed to German measles and have never had them.  You are exposed to fifth disease or chickenpox.  You develop belly (abdominal) pain. Round ligament discomfort is a common non-cancerous (benign) cause of abdominal pain in pregnancy. Your caregiver still must evaluate you.  You develop a bad headache that does not go away.  You develop fever, diarrhea, pain with urination, or shortness of breath.  You develop visual problems, blurry, or double vision.  You fall or are in a car accident or any kind of trauma.  There is mental or physical violence at home. Document Released: 04/28/2001 Document Revised: 07/27/2011 Document Reviewed: 10/31/2008 [employer] Patient Information 2013 [employer].  

## 2012-08-23 NOTE — Progress Notes (Signed)
BP weight and urine results all reviewed and noted. Patient reports good fetal movement, denies any bleeding and no rupture of membranes symptoms or regular contractions. Patient is without complaints. All questions were answered.  

## 2012-08-29 ENCOUNTER — Telehealth: Payer: Self-pay | Admitting: Obstetrics & Gynecology

## 2012-08-29 DIAGNOSIS — Z3480 Encounter for supervision of other normal pregnancy, unspecified trimester: Secondary | ICD-10-CM

## 2012-08-29 MED ORDER — PRENATAL PLUS 27-1 MG PO TABS: 1.0000 | ORAL_TABLET | Freq: Every day | ORAL | Status: DC

## 2012-08-29 NOTE — Telephone Encounter (Signed)
Needs Rx for Prenatal Plus has Medicaid for insurance.

## 2012-08-29 NOTE — Telephone Encounter (Signed)
RX for prenatal plus called to [facility] in [place] per order. Pt aware.

## 2012-09-20 ENCOUNTER — Ambulatory Visit (INDEPENDENT_AMBULATORY_CARE_PROVIDER_SITE_OTHER): Payer: Medicaid Other | Admitting: Advanced Practice Midwife

## 2012-09-20 VITALS — BP 108/70 | Wt 180.0 lb

## 2012-09-20 DIAGNOSIS — Z1389 Encounter for screening for other disorder: Secondary | ICD-10-CM

## 2012-09-20 DIAGNOSIS — O09299 Supervision of pregnancy with other poor reproductive or obstetric history, unspecified trimester: Secondary | ICD-10-CM

## 2012-09-20 DIAGNOSIS — Z3492 Encounter for supervision of normal pregnancy, unspecified, second trimester: Secondary | ICD-10-CM

## 2012-09-20 DIAGNOSIS — Z331 Pregnant state, incidental: Secondary | ICD-10-CM

## 2012-09-20 LAB — POCT URINALYSIS DIPSTICK
Blood, UA: NEGATIVE
Nitrite, UA: NEGATIVE

## 2012-09-20 NOTE — Progress Notes (Signed)
No c/o at this time.  Routine questions about pregnancy answered.  F/U in 3  weeks for PN2. Declines classes d/t  cost

## 2012-09-20 NOTE — Patient Instructions (Signed)
Nothing to eat or drink after midnight before your next appointment & plan to be here for 2 hours (for your sugar test).  

## 2012-09-20 NOTE — Assessment & Plan Note (Addendum)
Clinic:[facility]  Genetic Screen NT:      Inc DSR      1:110          Declined [place]  Anatomic [place] normal  Glucose Screen   GBS   Feeding Preference   Contraception   Circumcision

## 2012-10-19 ENCOUNTER — Ambulatory Visit (INDEPENDENT_AMBULATORY_CARE_PROVIDER_SITE_OTHER): Payer: Medicaid Other | Admitting: Obstetrics & Gynecology

## 2012-10-19 ENCOUNTER — Encounter: Payer: Self-pay | Admitting: Obstetrics & Gynecology

## 2012-10-19 ENCOUNTER — Other Ambulatory Visit: Payer: Medicaid Other

## 2012-10-19 VITALS — BP 110/70 | Wt 187.0 lb

## 2012-10-19 DIAGNOSIS — Z331 Pregnant state, incidental: Secondary | ICD-10-CM

## 2012-10-19 DIAGNOSIS — Z3482 Encounter for supervision of other normal pregnancy, second trimester: Secondary | ICD-10-CM

## 2012-10-19 DIAGNOSIS — O09299 Supervision of pregnancy with other poor reproductive or obstetric history, unspecified trimester: Secondary | ICD-10-CM

## 2012-10-19 DIAGNOSIS — Z1389 Encounter for screening for other disorder: Secondary | ICD-10-CM

## 2012-10-19 LAB — POCT URINALYSIS DIPSTICK
Ketones, UA: NEGATIVE
Nitrite, UA: NEGATIVE
Protein, UA: NEGATIVE

## 2012-10-19 LAB — CBC
MCH: 27.7 pg (ref 26.0–34.0)
MCHC: 34.1 g/dL (ref 30.0–36.0)
Platelets: 305 10*3/uL (ref 150–400)

## 2012-10-19 NOTE — Progress Notes (Signed)
BP weight and urine results all reviewed and noted. Patient reports good fetal movement, denies any bleeding and no rupture of membranes symptoms or regular contractions. Patient is without complaints. All questions were answered.  

## 2012-10-20 LAB — ANTIBODY SCREEN: Antibody Screen: NEGATIVE

## 2012-10-20 LAB — HIV ANTIBODY (ROUTINE TESTING W REFLEX): HIV: NONREACTIVE

## 2012-10-20 LAB — GLUCOSE TOLERANCE, 2 HOURS W/ 1HR: Glucose, 2 hour: 82 mg/dL (ref 70–139)

## 2012-11-09 ENCOUNTER — Ambulatory Visit (INDEPENDENT_AMBULATORY_CARE_PROVIDER_SITE_OTHER): Payer: Medicaid Other | Admitting: Obstetrics & Gynecology

## 2012-11-09 ENCOUNTER — Encounter: Payer: Self-pay | Admitting: Obstetrics & Gynecology

## 2012-11-09 VITALS — BP 150/80 | Wt 194.0 lb

## 2012-11-09 DIAGNOSIS — Z331 Pregnant state, incidental: Secondary | ICD-10-CM

## 2012-11-09 DIAGNOSIS — O09299 Supervision of pregnancy with other poor reproductive or obstetric history, unspecified trimester: Secondary | ICD-10-CM

## 2012-11-09 DIAGNOSIS — Z1389 Encounter for screening for other disorder: Secondary | ICD-10-CM

## 2012-11-09 DIAGNOSIS — Z3493 Encounter for supervision of normal pregnancy, unspecified, third trimester: Secondary | ICD-10-CM

## 2012-11-09 LAB — POCT URINALYSIS DIPSTICK
Leukocytes, UA: NEGATIVE
Nitrite, UA: NEGATIVE

## 2012-11-09 NOTE — Progress Notes (Signed)
Watch BP, no treatment today, closer follow up to recheck next week, weight and urine results all reviewed and noted. Patient reports good fetal movement, denies any bleeding and no rupture of membranes symptoms or regular contractions. Patient is without complaints. All questions were answered.

## 2012-11-09 NOTE — Patient Instructions (Signed)
How a Baby Grows During Pregnancy Pregnancy begins when the female's sperm enters the female's egg. This happens in the fallopian tube and is called fertilization. The fertilized egg is called an embryo until it reaches 9 weeks from the time of fertilization. From 9 weeks until birth it is called a fetus. The fertilized egg moves down the tube into the uterus and attaches to the inside lining of the uterus.  The pregnant woman is responsible for the growth of the embryo/fetus by supplying nourishment and oxygen through the blood stream and placenta to the developing fetus. The uterus becomes larger and pops out from the abdomen more and more as the fetus develops and grows. A normal pregnancy lasts 280 days, with a range of 259 to 294 days, or 40 weeks. The pregnancy is divided up into three trimesters:  First trimester - 0 to 13 weeks.  Second trimester - 14 to 27 weeks.  Third trimester - 28 to 40 weeks. The day your baby is supposed to be born is called estimated date of confinement [facility]) or estimated date of delivery (EDD). GROWTH OF THE BABY MONTH BY MONTH 1. First Month: The fertilized egg attaches to the inside of the uterus and certain cells will form the placenta and others will develop into the fetus. The arms, legs, brain, spinal cord, lungs, and heart begin to develop. At the end of the first month the heart begins to beat. The embryo weighs less than an ounce and is  inch long. 2. Second Month: The bones can be seen, the inner ear, eye lids, hands and feet form and genitals develop. By the end of 8 weeks, all of the major organs are developing. The fetus now weighs less than an ounce and is one inch (2.54 cm) long. 3. Third Month: Teeth buds appear, all the internal organs are forming, bones and muscles begin to grow, the spine can flex and the skin is transparent. Finger and toe nails begin to form, the hands develop faster than the feet and the arms are longer than the legs at this point.  The fetus weighs a little more than an ounce (0.03 kg) and is 3 inches (8.89cm) long. 4. Fourth Month: The placenta is completely formed. The external sex organs, neck, outer ear, eyebrows, eyelids and fingernails are formed. The fetus can hear, swallow, flex its arms and legs and the kidney begins to produce urine. The skin is covered with a white waxy coating (vernix) and very thin hair (lanugo) is present. The fetus weighs 5 ounces (0.14kg) and is 6 to 7 inches (16.51cm) long. 5. Fifth Month: The fetus moves around more and can be felt for the first time (called quickening), sleeps and wakes up at times, may begin to suck its finger and the nails grow to the end of the fingers. The gallbladder is now functioning and helps to digest the nutrients, eggs are formed in the female and the testicles begin to drop down from the abdomen to the scrotum in the female. The fetus weighs  to 1 pound (0.45kg) and is 10 inches (25.4cm) long. 6. Sixth Month: The lungs are formed but the fetus does not breath yet. The eyes open, the brain develops more quickly at this time, one can detect finger and toe prints and thicker hair grows. The fetus weighs 1 to 1 pounds (0.68kg) and is 12 inches (30.48cm) long. 7. Seventh Month: The fetus can hear and respond to sounds, kicks and stretches and can sense  changes in light. The fetus weighs 2 to 2 pounds (1.13kg) and is 14 inches (35.56cm) long. 8. Eight Month: All organs and body systems are fully developed and functioning. The bones get harder, taste buds develop and can taste sweet and sour flavors and the fetus may hiccup now. Different parts of the brain are developing and the skull remains soft for the brain to grow. The fetus weighs 5 pounds (2.27kg) and is 18 inches (45.75cm) long. 9. Ninth Month: The fetus gains about a half a pound a week, the lungs are fully developed, patterns of sleep develop and the head moves down into the bottom of the uterus called vertex. If the  buttocks moves into the bottom of the uterus, it is called a breech. The fetus weighs 6 to 9 pounds (2.72 to 4.08kg) and is 20 inches (50.8cm) long. You should be informed about your pregnancy, yourself and how the baby is developing as much as possible. Being informed helps you to enjoy this experience. It also gives you the sense to feel if something is not going right and when to ask questions. Talk to your caregiver when you have questions about your baby or your own body. Document Released: 10/21/2007 Document Revised: 07/27/2011 Document Reviewed: 10/21/2007 [facility] Patient Information 2014 [place], [place].

## 2012-11-09 NOTE — Progress Notes (Signed)
Feet hurt sometimes, after walking a lot.

## 2012-11-16 ENCOUNTER — Encounter: Payer: Self-pay | Admitting: Obstetrics & Gynecology

## 2012-11-16 ENCOUNTER — Ambulatory Visit (INDEPENDENT_AMBULATORY_CARE_PROVIDER_SITE_OTHER): Payer: Medicaid Other | Admitting: Obstetrics & Gynecology

## 2012-11-16 VITALS — BP 132/80 | Wt 195.0 lb

## 2012-11-16 DIAGNOSIS — O359XX0 Maternal care for (suspected) fetal abnormality and damage, unspecified, not applicable or unspecified: Secondary | ICD-10-CM

## 2012-11-16 DIAGNOSIS — O09299 Supervision of pregnancy with other poor reproductive or obstetric history, unspecified trimester: Secondary | ICD-10-CM

## 2012-11-16 DIAGNOSIS — Z331 Pregnant state, incidental: Secondary | ICD-10-CM

## 2012-11-16 DIAGNOSIS — Z1389 Encounter for screening for other disorder: Secondary | ICD-10-CM

## 2012-11-16 LAB — POCT URINALYSIS DIPSTICK
Blood, UA: NEGATIVE
Glucose, UA: NEGATIVE
Nitrite, UA: NEGATIVE

## 2012-11-16 NOTE — Progress Notes (Signed)
BP weight and urine results all reviewed and noted. Patient reports good fetal movement, denies any bleeding and no rupture of membranes symptoms or regular contractions. Patient is without complaints. All questions were answered. Sonogram next visit to check growth with abnormal integrated screen

## 2012-11-16 NOTE — Patient Instructions (Addendum)
How a Baby Grows During Pregnancy Pregnancy begins when the female's sperm enters the female's egg. This happens in the fallopian tube and is called fertilization. The fertilized egg is called an embryo until it reaches 9 weeks from the time of fertilization. From 9 weeks until birth it is called a fetus. The fertilized egg moves down the tube into the uterus and attaches to the inside lining of the uterus.  The pregnant woman is responsible for the growth of the embryo/fetus by supplying nourishment and oxygen through the blood stream and placenta to the developing fetus. The uterus becomes larger and pops out from the abdomen more and more as the fetus develops and grows. A normal pregnancy lasts 280 days, with a range of 259 to 294 days, or 40 weeks. The pregnancy is divided up into three trimesters:  First trimester - 0 to 13 weeks.  Second trimester - 14 to 27 weeks.  Third trimester - 28 to 40 weeks. The day your baby is supposed to be born is called estimated date of confinement (EDC) or estimated date of delivery (EDD). GROWTH OF THE BABY MONTH BY MONTH 1. First Month: The fertilized egg attaches to the inside of the uterus and certain cells will form the placenta and others will develop into the fetus. The arms, legs, brain, spinal cord, lungs, and heart begin to develop. At the end of the first month the heart begins to beat. The embryo weighs less than an ounce and is  inch long. 2. Second Month: The bones can be seen, the inner ear, eye lids, hands and feet form and genitals develop. By the end of 8 weeks, all of the major organs are developing. The fetus now weighs less than an ounce and is one inch (2.54 cm) long. 3. Third Month: Teeth buds appear, all the internal organs are forming, bones and muscles begin to grow, the spine can flex and the skin is transparent. Finger and toe nails begin to form, the hands develop faster than the feet and the arms are longer than the legs at this point.  The fetus weighs a little more than an ounce (0.03 kg) and is 3 inches (8.89cm) long. 4. Fourth Month: The placenta is completely formed. The external sex organs, neck, outer ear, eyebrows, eyelids and fingernails are formed. The fetus can hear, swallow, flex its arms and legs and the kidney begins to produce urine. The skin is covered with a white waxy coating (vernix) and very thin hair (lanugo) is present. The fetus weighs 5 ounces (0.14kg) and is 6 to 7 inches (16.51cm) long. 5. Fifth Month: The fetus moves around more and can be felt for the first time (called quickening), sleeps and wakes up at times, may begin to suck its finger and the nails grow to the end of the fingers. The gallbladder is now functioning and helps to digest the nutrients, eggs are formed in the female and the testicles begin to drop down from the abdomen to the scrotum in the female. The fetus weighs  to 1 pound (0.45kg) and is 10 inches (25.4cm) long. 6. Sixth Month: The lungs are formed but the fetus does not breath yet. The eyes open, the brain develops more quickly at this time, one can detect finger and toe prints and thicker hair grows. The fetus weighs 1 to 1 pounds (0.68kg) and is 12 inches (30.48cm) long. 7. Seventh Month: The fetus can hear and respond to sounds, kicks and stretches and can sense   changes in light. The fetus weighs 2 to 2 pounds (1.13kg) and is 14 inches (35.56cm) long. 8. Eight Month: All organs and body systems are fully developed and functioning. The bones get harder, taste buds develop and can taste sweet and sour flavors and the fetus may hiccup now. Different parts of the brain are developing and the skull remains soft for the brain to grow. The fetus weighs 5 pounds (2.27kg) and is 18 inches (45.75cm) long. 9. Ninth Month: The fetus gains about a half a pound a week, the lungs are fully developed, patterns of sleep develop and the head moves down into the bottom of the uterus called vertex. If the  buttocks moves into the bottom of the uterus, it is called a breech. The fetus weighs 6 to 9 pounds (2.72 to 4.08kg) and is 20 inches (50.8cm) long. You should be informed about your pregnancy, yourself and how the baby is developing as much as possible. Being informed helps you to enjoy this experience. It also gives you the sense to feel if something is not going right and when to ask questions. Talk to your caregiver when you have questions about your baby or your own body. Document Released: 10/21/2007 Document Revised: 07/27/2011 Document Reviewed: 10/21/2007 [employer] Patient Information 2014 [employer].  

## 2012-11-30 ENCOUNTER — Other Ambulatory Visit: Payer: Medicaid Other

## 2012-11-30 ENCOUNTER — Encounter: Payer: Medicaid Other | Admitting: Obstetrics & Gynecology

## 2012-12-01 ENCOUNTER — Ambulatory Visit (INDEPENDENT_AMBULATORY_CARE_PROVIDER_SITE_OTHER): Payer: Medicaid Other

## 2012-12-01 ENCOUNTER — Other Ambulatory Visit: Payer: Self-pay | Admitting: Obstetrics & Gynecology

## 2012-12-01 DIAGNOSIS — O359XX0 Maternal care for (suspected) fetal abnormality and damage, unspecified, not applicable or unspecified: Secondary | ICD-10-CM

## 2012-12-01 DIAGNOSIS — O358XX0 Maternal care for other (suspected) fetal abnormality and damage, not applicable or unspecified: Secondary | ICD-10-CM

## 2012-12-01 NOTE — Progress Notes (Signed)
F/U Growth d/t abn. IT elevated for Trisomy 21. Today's scan reveals nml anatomy. Meas. C/w dates EFW= 5#,5 oz./58% , cx closed4.0cm, AFI=14.8 cm/50% for 33+4 wksbilat ovs prev. Seen,post. Plac., vertex lie, female fetusFHR=133 bpm.

## 2012-12-14 ENCOUNTER — Encounter: Payer: Self-pay | Admitting: Advanced Practice Midwife

## 2012-12-14 ENCOUNTER — Ambulatory Visit (INDEPENDENT_AMBULATORY_CARE_PROVIDER_SITE_OTHER): Payer: Medicaid Other | Admitting: Advanced Practice Midwife

## 2012-12-14 VITALS — BP 110/70 | Wt 202.0 lb

## 2012-12-14 DIAGNOSIS — Z331 Pregnant state, incidental: Secondary | ICD-10-CM

## 2012-12-14 DIAGNOSIS — Z3493 Encounter for supervision of normal pregnancy, unspecified, third trimester: Secondary | ICD-10-CM

## 2012-12-14 DIAGNOSIS — K219 Gastro-esophageal reflux disease without esophagitis: Secondary | ICD-10-CM

## 2012-12-14 DIAGNOSIS — O09299 Supervision of pregnancy with other poor reproductive or obstetric history, unspecified trimester: Secondary | ICD-10-CM

## 2012-12-14 DIAGNOSIS — Z1389 Encounter for screening for other disorder: Secondary | ICD-10-CM

## 2012-12-14 LAB — POCT URINALYSIS DIPSTICK
Blood, UA: NEGATIVE
Ketones, UA: NEGATIVE

## 2012-12-14 MED ORDER — OMEPRAZOLE 20 MG PO CPDR: 20.0000 mg | DELAYED_RELEASE_CAPSULE | Freq: Every day | ORAL | Status: DC

## 2012-12-14 NOTE — Patient Instructions (Addendum)

## 2012-12-14 NOTE — Progress Notes (Signed)
C/o chest pain before and after meals.  Rx prilosec 20mg  qd;  Left round ligament pain.   58% at 32 weeks.  (U/S is under imaging tab)  Routine questions about pregnancy answered.  F/U in 1-2 weeks for LROB.

## 2012-12-14 NOTE — Addendum Note (Signed)
Addended by: Cheral Marker on: 12/14/2012 05:32 PM   Modules accepted: Orders

## 2012-12-14 NOTE — Progress Notes (Signed)
C/C CHEST PAIN  BEFORE AND AFTER MEALS. PAIN LEFT SIDE ABDOMEN.

## 2012-12-27 ENCOUNTER — Encounter: Payer: Self-pay | Admitting: Obstetrics & Gynecology

## 2012-12-27 ENCOUNTER — Ambulatory Visit (INDEPENDENT_AMBULATORY_CARE_PROVIDER_SITE_OTHER): Payer: Medicaid Other | Admitting: Obstetrics & Gynecology

## 2012-12-27 VITALS — BP 120/80 | Wt 200.0 lb

## 2012-12-27 DIAGNOSIS — O09299 Supervision of pregnancy with other poor reproductive or obstetric history, unspecified trimester: Secondary | ICD-10-CM

## 2012-12-27 DIAGNOSIS — Z331 Pregnant state, incidental: Secondary | ICD-10-CM | POA: Insufficient documentation

## 2012-12-27 DIAGNOSIS — Z1389 Encounter for screening for other disorder: Secondary | ICD-10-CM

## 2012-12-27 LAB — POCT URINALYSIS DIPSTICK
Blood, UA: NEGATIVE
Glucose, UA: NEGATIVE
Ketones, UA: NEGATIVE

## 2012-12-27 NOTE — Patient Instructions (Signed)

## 2012-12-27 NOTE — Progress Notes (Signed)
FOR GBS/GC/CHL TODAY.

## 2012-12-27 NOTE — Progress Notes (Signed)
BP weight and urine results all reviewed and noted. Patient reports good fetal movement, denies any bleeding and no rupture of membranes symptoms or regular contractions. Patient is without complaints. All questions were answered.  

## 2012-12-28 LAB — GC/CHLAMYDIA PROBE AMP
CT Probe RNA: NEGATIVE
GC Probe RNA: NEGATIVE

## 2012-12-29 LAB — STREP B DNA PROBE: GBSP: POSITIVE

## 2013-01-05 ENCOUNTER — Ambulatory Visit (INDEPENDENT_AMBULATORY_CARE_PROVIDER_SITE_OTHER): Payer: Medicaid Other | Admitting: Obstetrics & Gynecology

## 2013-01-05 ENCOUNTER — Encounter: Payer: Self-pay | Admitting: Obstetrics & Gynecology

## 2013-01-05 VITALS — BP 120/80 | Wt 207.0 lb

## 2013-01-05 DIAGNOSIS — Z331 Pregnant state, incidental: Secondary | ICD-10-CM

## 2013-01-05 DIAGNOSIS — Z3483 Encounter for supervision of other normal pregnancy, third trimester: Secondary | ICD-10-CM

## 2013-01-05 DIAGNOSIS — O09299 Supervision of pregnancy with other poor reproductive or obstetric history, unspecified trimester: Secondary | ICD-10-CM

## 2013-01-05 DIAGNOSIS — Z1389 Encounter for screening for other disorder: Secondary | ICD-10-CM

## 2013-01-05 LAB — POCT URINALYSIS DIPSTICK
Leukocytes, UA: NEGATIVE
Nitrite, UA: NEGATIVE
Protein, UA: 1

## 2013-01-05 NOTE — Progress Notes (Signed)
BP weight and urine results all reviewed and noted. Patient reports good fetal movement, denies any bleeding and no rupture of membranes symptoms or regular contractions. Patient is without complaints. All questions were answered.  

## 2013-01-05 NOTE — Patient Instructions (Signed)
Labor Induction  Most women go into labor on their own between 37 and 42 weeks of the pregnancy. When this does not happen or when there is a medical need, medicine or other methods may be used to induce labor. Labor induction causes a pregnant woman's uterus to contract. It also causes the cervix to soften (ripen), open (dilate), and thin out (efface). Usually, labor is not induced before 39 weeks of the pregnancy unless there is a problem with the baby or mother. Whether your labor will be induced depends on a number of factors, including the following:  The medical condition of you and the baby.  How many weeks along you are.  The status of baby's lung maturity.  The condition of the cervix.  The position of the baby. REASONS FOR LABOR INDUCTION  The health of the baby or mother is at risk.  The pregnancy is overdue by 1 week or more.  The water breaks but labor does not start on its own.  The mother has a health condition or serious illness such as high blood pressure, infection, placental abruption, or diabetes.  The amniotic fluid amounts are low around the baby.  The baby is distressed. REASONS TO NOT INDUCE LABOR Labor induction may not be a good idea if:  It is shown that your baby does not tolerate labor.  An induction is just more convenient.  You want the baby to be born on a certain date, like a holiday.  You have had previous surgeries on your uterus, such as a myomectomy or the removal of fibroids.  Your placenta lies very low in the uterus and blocks the opening of the cervix (placenta previa).  Your baby is not in a head down position.  The umbilical cord drops down into the birth canal in front of the baby. This could cut off the baby's blood and oxygen supply.  You have had a previous cesarean delivery.  There areunusual circumstances, such as the baby being extremely premature. RISKS AND COMPLICATIONS Problems may occur in the process of induction  and plans may need to be modified as a situation unfolds. Some of the risks of induction include:  Change in fetal heart rate, such as too high, too low, or erratic.  Risk of fetal distress.  Risk of infection to mother and baby.  Increased chance of having a cesarean delivery.  The rare, but increased chance that the placenta will separate from the uterus (abruption).  Uterine rupture (very rare). When induction is needed for medical reasons, the benefits of induction may outweigh the risks. BEFORE THE PROCEDURE Your caregiver will check your cervix and the baby's position. This will help your caregiver decide if you are far enough along for an induction to work. PROCEDURE Several methods of labor induction may be used, such as:   Taking prostaglandin medicine to dilate and ripen the cervix. The medicine will also start contractions. It can be taken by mouth or by inserting a suppository into the vagina.  A thin tube (catheter) with a balloon on the end may be inserted into your vagina to dilate the cervix. Once inserted, the balloon expands with water, which causes the cervix to open.  Striping the membranes. Your caregiver inserts a finger between the cervix and membranes, which causes the cervix to be stretched and may cause the uterus to contract. This is often done during an office visit. You will be sent home to wait for the contractions to begin. You will   then come in for an induction.  Breaking the water. Your caregiver will make a hole in the amniotic sac using a small instrument. Once the amniotic sac breaks, contractions should begin. This may still take hours to see an effect.  Taking medicine to trigger or strengthen contractions. This medicine is given intravenously through a tube in your arm. All of the methods of induction, besides stripping the membranes, will be done in the hospital. Induction is done in the hospital so that you and the baby can be carefully  monitored. AFTER THE PROCEDURE Some inductions can take up to 2 or 3 days. Depending on the cervix, it usually takes less time. It takes longer when you are induced early in the pregnancy or if this is your first pregnancy. If a mother is still pregnant and the induction has been going on for 2 to 3 days, either the mother will be sent home or a cesarean delivery will be needed. Document Released: 09/23/2006 Document Revised: 07/27/2011 Document Reviewed: 03/09/2011 [employer] Patient Information 2014 [employer].  

## 2013-01-05 NOTE — Progress Notes (Signed)
Lost mucus plug.

## 2013-01-06 ENCOUNTER — Inpatient Hospital Stay (HOSPITAL_COMMUNITY)
Admission: AD | Admit: 2013-01-06 | Discharge: 2013-01-09 | DRG: 775 | Disposition: A | Discharge status: Home / Self Care | Payer: Medicaid Other | Source: Ambulatory Visit | Attending: Obstetrics & Gynecology | Admitting: Obstetrics & Gynecology

## 2013-01-06 ENCOUNTER — Inpatient Hospital Stay (HOSPITAL_COMMUNITY): Payer: Medicaid Other

## 2013-01-06 ENCOUNTER — Encounter (HOSPITAL_COMMUNITY): Payer: Self-pay | Admitting: Anesthesiology

## 2013-01-06 ENCOUNTER — Encounter (HOSPITAL_COMMUNITY): Payer: Self-pay

## 2013-01-06 ENCOUNTER — Inpatient Hospital Stay (HOSPITAL_COMMUNITY): Payer: Medicaid Other | Admitting: Anesthesiology

## 2013-01-06 DIAGNOSIS — Z331 Pregnant state, incidental: Secondary | ICD-10-CM

## 2013-01-06 DIAGNOSIS — O99892 Other specified diseases and conditions complicating childbirth: Secondary | ICD-10-CM | POA: Diagnosis present

## 2013-01-06 DIAGNOSIS — Z22330 Carrier of Group B streptococcus: Secondary | ICD-10-CM

## 2013-01-06 LAB — CBC
MCH: 26.3 pg (ref 26.0–34.0)
MCHC: 34.7 g/dL (ref 30.0–36.0)
MCV: 75.8 fL — ABNORMAL LOW (ref 78.0–100.0)
Platelets: 273 10*3/uL (ref 150–400)
RDW: 15.1 % (ref 11.5–15.5)

## 2013-01-06 LAB — ABO/RH: ABO/RH(D): A POS

## 2013-01-06 LAB — TYPE AND SCREEN

## 2013-01-06 LAB — RPR: RPR Ser Ql: NONREACTIVE

## 2013-01-06 MED ORDER — ONDANSETRON HCL 4 MG/2ML IJ SOLN
4.0000 mg | Freq: Four times a day (QID) | INTRAMUSCULAR | Status: DC | PRN
  Administered 2013-01-07: 4 mg via INTRAVENOUS
  Filled 2013-01-06: qty 2

## 2013-01-06 MED ORDER — OXYTOCIN 40 UNITS IN LACTATED RINGERS INFUSION - SIMPLE MED
1.0000 m[IU]/min | INTRAVENOUS | Status: DC
  Administered 2013-01-06: 1 m[IU]/min via INTRAVENOUS
  Filled 2013-01-06: qty 1000

## 2013-01-06 MED ORDER — TERBUTALINE SULFATE 1 MG/ML IJ SOLN: 0.2500 mg | Freq: Once | INTRAMUSCULAR | Status: AC | PRN

## 2013-01-06 MED ORDER — EPHEDRINE 5 MG/ML INJ
10.0000 mg | INTRAVENOUS | Status: DC | PRN
  Filled 2013-01-06: qty 2
  Filled 2013-01-06: qty 4

## 2013-01-06 MED ORDER — IBUPROFEN 600 MG PO TABS: 600.0000 mg | ORAL_TABLET | Freq: Four times a day (QID) | ORAL | Status: DC | PRN

## 2013-01-06 MED ORDER — LACTATED RINGERS IV SOLN
500.0000 mL | Freq: Once | INTRAVENOUS | Status: AC
  Administered 2013-01-06: 500 mL via INTRAVENOUS

## 2013-01-06 MED ORDER — DIPHENHYDRAMINE HCL 50 MG/ML IJ SOLN: 12.5000 mg | INTRAMUSCULAR | Status: DC | PRN

## 2013-01-06 MED ORDER — FENTANYL 2.5 MCG/ML BUPIVACAINE 1/10 % EPIDURAL INFUSION (WH - ANES)
14.0000 mL/h | INTRAMUSCULAR | Status: DC | PRN
  Administered 2013-01-06 (×2): 14 mL/h via EPIDURAL
  Filled 2013-01-06 (×3): qty 125

## 2013-01-06 MED ORDER — FENTANYL 2.5 MCG/ML BUPIVACAINE 1/10 % EPIDURAL INFUSION (WH - ANES)
INTRAMUSCULAR | Status: AC
  Filled 2013-01-06: qty 125

## 2013-01-06 MED ORDER — PENICILLIN G POTASSIUM 5000000 UNITS IJ SOLR
5.0000 10*6.[IU] | Freq: Once | INTRAVENOUS | Status: AC
  Administered 2013-01-06: 5 10*6.[IU] via INTRAVENOUS
  Filled 2013-01-06: qty 5

## 2013-01-06 MED ORDER — PENICILLIN G POTASSIUM 5000000 UNITS IJ SOLR
2.5000 10*6.[IU] | INTRAVENOUS | Status: DC
  Administered 2013-01-06 – 2013-01-07 (×3): 2.5 10*6.[IU] via INTRAVENOUS
  Filled 2013-01-06 (×7): qty 2.5

## 2013-01-06 MED ORDER — OXYTOCIN BOLUS FROM INFUSION
500.0000 mL | INTRAVENOUS | Status: DC
  Administered 2013-01-07: 500 mL via INTRAVENOUS

## 2013-01-06 MED ORDER — ACETAMINOPHEN 325 MG PO TABS: 650.0000 mg | ORAL_TABLET | ORAL | Status: DC | PRN

## 2013-01-06 MED ORDER — PHENYLEPHRINE 40 MCG/ML (10ML) SYRINGE FOR IV PUSH (FOR BLOOD PRESSURE SUPPORT)
80.0000 ug | PREFILLED_SYRINGE | INTRAVENOUS | Status: DC | PRN
  Filled 2013-01-06: qty 2

## 2013-01-06 MED ORDER — LACTATED RINGERS IV SOLN
INTRAVENOUS | Status: DC
  Administered 2013-01-06 (×2): via INTRAVENOUS

## 2013-01-06 MED ORDER — EPHEDRINE 5 MG/ML INJ
10.0000 mg | INTRAVENOUS | Status: DC | PRN
  Filled 2013-01-06: qty 2

## 2013-01-06 MED ORDER — LIDOCAINE HCL (PF) 1 % IJ SOLN
30.0000 mL | INTRAMUSCULAR | Status: DC | PRN
  Filled 2013-01-06 (×2): qty 30

## 2013-01-06 MED ORDER — PHENYLEPHRINE 40 MCG/ML (10ML) SYRINGE FOR IV PUSH (FOR BLOOD PRESSURE SUPPORT)
80.0000 ug | PREFILLED_SYRINGE | INTRAVENOUS | Status: DC | PRN
  Filled 2013-01-06: qty 2
  Filled 2013-01-06: qty 5

## 2013-01-06 MED ORDER — LIDOCAINE HCL (PF) 1 % IJ SOLN
INTRAMUSCULAR | Status: DC | PRN
  Administered 2013-01-06 (×2): 5 mL

## 2013-01-06 MED ORDER — FENTANYL CITRATE 0.05 MG/ML IJ SOLN: 100.0000 ug | Freq: Once | INTRAMUSCULAR | Status: DC

## 2013-01-06 MED ORDER — OXYTOCIN 40 UNITS IN LACTATED RINGERS INFUSION - SIMPLE MED: 1.0000 m[IU]/min | INTRAVENOUS | Status: DC

## 2013-01-06 MED ORDER — PHENYLEPHRINE 40 MCG/ML (10ML) SYRINGE FOR IV PUSH (FOR BLOOD PRESSURE SUPPORT)
PREFILLED_SYRINGE | INTRAVENOUS | Status: AC
  Filled 2013-01-06: qty 5

## 2013-01-06 MED ORDER — CITRIC ACID-SODIUM CITRATE 334-500 MG/5ML PO SOLN: 30.0000 mL | ORAL | Status: DC | PRN

## 2013-01-06 MED ORDER — OXYCODONE-ACETAMINOPHEN 5-325 MG PO TABS: 1.0000 | ORAL_TABLET | ORAL | Status: DC | PRN

## 2013-01-06 MED ORDER — LACTATED RINGERS IV SOLN
500.0000 mL | INTRAVENOUS | Status: DC | PRN
Start: 2013-01-06 — End: 2013-01-07
  Administered 2013-01-06: 1000 mL via INTRAVENOUS

## 2013-01-06 MED ORDER — EPHEDRINE 5 MG/ML INJ
INTRAVENOUS | Status: AC
  Filled 2013-01-06: qty 4

## 2013-01-06 MED ORDER — OXYTOCIN 40 UNITS IN LACTATED RINGERS INFUSION - SIMPLE MED
62.5000 mL/h | INTRAVENOUS | Status: DC
  Administered 2013-01-07: 62.5 mL/h via INTRAVENOUS

## 2013-01-06 NOTE — Progress Notes (Signed)
Anesthesia called to bedside to answer questions pt has about epidural--F.Jackson at bedside

## 2013-01-06 NOTE — Progress Notes (Addendum)
Linda Warner is a 22 y.o. G2P0010 at [redacted]w[redacted]d admitted for induction of labor due to Non-reactive NST.  Subjective:   Objective: BP 125/57  Pulse 94  Temp(Src) 99.3 F (37.4 C) (Oral)  Resp 20  Ht 5' 2.5" (1.588 m)  Wt 94.462 kg (208 lb 4 oz)  BMI 37.46 kg/m2  SpO2 100%  LMP 04/10/2012      FHT:  FHR: 140s bpm, variability: minimal to moderate,  accelerations:  Abscent,  decelerations:  Absent UC:   irregular, every 5 minutes SVE:   Dilation: 2 Effacement (%): 50 Station: -2 Exam by:: Dr.Ileene Allie  Labs: Lab Results  Component Value Date   WBC 11.2* 01/06/2013   HGB 11.3* 01/06/2013   HCT 32.6* 01/06/2013   MCV 75.8* 01/06/2013   PLT 273 01/06/2013    Assessment / Plan: Induction of labor due to non-reassuring fetal testing,  progressing well on pitocin  Labor: no change with natural ctx pattern, spaced out after epidural and hydration. will start pitocin 1x1 up to 6,  placed foley bulb  to aid in dilitation. unlikely ruptured given high AFI and minimal discharge. Preeclampsia:  no signs or symptoms of toxicity Fetal Wellbeing:  Category II Pain Control:  Epidural I/D:  GBS + started on PCN Anticipated MOD:  NSVD  Okie Jansson RYAN 01/06/2013, 5:02 PM

## 2013-01-06 NOTE — H&P (Cosign Needed)
Linda Warner is a 21 y.o. female presenting for Non reassuring FHT and frequent contractions in term pregnancy.. Maternal Medical History:  Reason for admission: Nausea.    OB History   Grav Para Term Preterm Abortions TAB SAB Ect Mult Living   2    1  1         Past Medical History  Diagnosis Date  . Acne   . Scoliosis   . Pregnant   . Scoliosis   . BV (bacterial vaginosis)    Past Surgical History  Procedure Laterality Date  . No past surgeries     Family History: family history includes Cancer in her maternal grandmother; Depression in her father and maternal grandmother; Diabetes in her maternal grandmother; Hyperlipidemia in her maternal grandmother; Hypertension in her maternal grandmother. There is no history of Anesthesia problems. Social History:  reports that she has quit smoking. Her smoking use included Cigarettes. She smoked 0.00 packs per day. She has never used smokeless tobacco. She reports that she does not drink alcohol or use illicit drugs.   Prenatal Transfer Tool  Maternal Diabetes: No Genetic Screening: Abnormal:  Results: Elevated risk of Trisomy 21 Maternal Ultrasounds/Referrals: Normal Fetal Ultrasounds or other Referrals:  None Maternal Substance Abuse:  No Significant Maternal Medications:  None Significant Maternal Lab Results:  Lab values include: Group B Strep positive Other Comments:  None  Review of Systems  Constitutional: Negative for fever and chills.  Eyes: Negative for blurred vision and double vision.  Respiratory: Negative for cough and hemoptysis.   Cardiovascular: Negative for chest pain.  Gastrointestinal: Negative for nausea, vomiting and abdominal pain.  Neurological: Negative for headaches.    Dilation: 1.5 Effacement (%): 50 Station: -3 Exam by:: Dr. Ike Bene Blood pressure 137/92, pulse 101, temperature 96.9 F (36.1 C), temperature source Oral, resp. rate 18, height 5' 2.5" (1.588 m), weight 94.462 kg (208 lb 4 oz),  last menstrual period 04/10/2012. Exam Physical Exam  Nursing note and vitals reviewed. Constitutional: She is oriented to person, place, and time. She appears well-developed and well-nourished. No distress.  HENT:  Head: Normocephalic and atraumatic.  Eyes: Conjunctivae and EOM are normal.  Neck: Normal range of motion. Neck supple.  Cardiovascular: Normal rate, regular rhythm, normal heart sounds and intact distal pulses.  Exam reveals no gallop and no friction rub.   No murmur heard. Respiratory: Effort normal and breath sounds normal. No respiratory distress. She has no wheezes. She has no rales. She exhibits no tenderness.  GI: Soft. Bowel sounds are normal. She exhibits no distension and no mass. There is no tenderness. There is no rebound and no guarding.  Neurological: She is alert and oriented to person, place, and time.  Skin: She is not diaphoretic.  Psychiatric: She has a normal mood and affect. Her behavior is normal. Judgment and thought content normal.    FHT: 140s min variability, no accles, occassional variable decel Toco: q47min   Prenatal labs: ABO, Rh: A/Positive/-- (01/13 0000) Antibody: NEG (06/04 0923) Rubella: Immune (01/13 0000) RPR: NON REAC (06/04 0923)  HBsAg: Negative (01/13 0000)  HIV: NON REACTIVE (06/04 0923)  GBS: POSITIVE (08/12 1335)   Assessment/Plan: Linda Warner is a 22 y.o. G2P0010 at [redacted]w[redacted]d presents with less than reassuring strip and frequent ctx.  #Labor: latent labor, will monitor and repeat in 4hrs. If no change may consider augmentation. #Pain: Desires epidural and IV pain meds #FWB: Cat II no accels, occassional variables, 6/8 BPP for breathing #ID: GBS +  start PCN #MOF: Bottle #MOC: Mirena #Circ: Desires considering in house.  Tawana Scale, MD OB Fellow 01/06/2013, 11:29 AM

## 2013-01-06 NOTE — Anesthesia Procedure Notes (Signed)

## 2013-01-06 NOTE — Progress Notes (Signed)
PANAGIOTA PERFETTI is a 22 y.o. G2P0010 at [redacted]w[redacted]d admitted for NRFHT with 6/8 BPP and term.   Subjective: Pt with severe contraction pain, now with epidural and better pain control  Objective: BP 134/70  Pulse 96  Temp(Src) 96.9 F (36.1 C) (Oral)  Resp 18  Ht 5' 2.5" (1.588 m)  Wt 94.462 kg (208 lb 4 oz)  BMI 37.46 kg/m2  SpO2 100%  LMP 04/10/2012      FHT:  FHR: 150 bpm, variability: moderate,  accelerations:  Present,  decelerations:  Absent UC:   regular, every 2-3 minutes SVE:   Dilation: 1.5 Effacement (%): 50 Station: -2 Exam by:: J.Thornton, RN  2/50/-2 @1330  MRO Labs: Lab Results  Component Value Date   WBC 11.2* 01/06/2013   HGB 11.3* 01/06/2013   HCT 32.6* 01/06/2013   MCV 75.8* 01/06/2013   PLT 273 01/06/2013    Assessment / Plan: Protracted latent phase  Labor: Progressing normally and will consider pIt if no change in cervix Preeclampsia:  no signs or symptoms of toxicity Fetal Wellbeing:  Category I Pain Control:  Epidural I/D:  GBS + on PCN Anticipated MOD:  NSVD  Glenford Garis RYAN 01/06/2013, 1:37 PM

## 2013-01-06 NOTE — MAU Note (Signed)
Past two days states she has leaked fluid. Saw small amount of blood last night. States that since 3am she has been having pain on and off. Denies currently leaking fluid or blood.

## 2013-01-06 NOTE — Anesthesia Preprocedure Evaluation (Signed)
Anesthesia Evaluation  Patient identified by MRN, date of birth, ID band Patient awake    Reviewed: Allergy & Precautions, H&P , Patient's Chart, lab work & pertinent test results  Airway Mallampati: II TM Distance: >3 FB Neck ROM: full    Dental no notable dental hx.    Pulmonary neg pulmonary ROS,  breath sounds clear to auscultation  Pulmonary exam normal       Cardiovascular negative cardio ROS  Rhythm:regular Rate:Normal     Neuro/Psych negative neurological ROS  negative psych ROS   GI/Hepatic negative GI ROS, Neg liver ROS,   Endo/Other  negative endocrine ROS  Renal/GU negative Renal ROS     Musculoskeletal   Abdominal   Peds  Hematology negative hematology ROS (+)   Anesthesia Other Findings Acne     Scoliosis        Pregnant     Scoliosis        BV (bacterial vaginosis)          Reproductive/Obstetrics (+) Pregnancy                           Anesthesia Physical Anesthesia Plan  ASA: II  Anesthesia Plan: Epidural   Post-op Pain Management:    Induction:   Airway Management Planned:   Additional Equipment:   Intra-op Plan:   Post-operative Plan:   Informed Consent: I have reviewed the patients History and Physical, chart, labs and discussed the procedure including the risks, benefits and alternatives for the proposed anesthesia with the patient or authorized representative who has indicated his/her understanding and acceptance.     Plan Discussed with:   Anesthesia Plan Comments:         Anesthesia Quick Evaluation

## 2013-01-06 NOTE — MAU Note (Signed)
Pt states ctx's q5-6 minutes apart, noted bleeding after lof 2 days ago. Wearing pad at present.

## 2013-01-07 ENCOUNTER — Encounter (HOSPITAL_COMMUNITY): Payer: Self-pay | Admitting: *Deleted

## 2013-01-07 DIAGNOSIS — O9989 Other specified diseases and conditions complicating pregnancy, childbirth and the puerperium: Secondary | ICD-10-CM

## 2013-01-07 MED ORDER — ZOLPIDEM TARTRATE 5 MG PO TABS: 5.0000 mg | ORAL_TABLET | Freq: Every evening | ORAL | Status: DC | PRN

## 2013-01-07 MED ORDER — BENZOCAINE-MENTHOL 20-0.5 % EX AERO
1.0000 "application " | INHALATION_SPRAY | CUTANEOUS | Status: DC | PRN
  Administered 2013-01-07: 1 via TOPICAL
  Filled 2013-01-07: qty 56

## 2013-01-07 MED ORDER — ONDANSETRON HCL 4 MG/2ML IJ SOLN: 4.0000 mg | INTRAMUSCULAR | Status: DC | PRN

## 2013-01-07 MED ORDER — IBUPROFEN 600 MG PO TABS
600.0000 mg | ORAL_TABLET | Freq: Four times a day (QID) | ORAL | Status: DC
  Administered 2013-01-07 – 2013-01-08 (×8): 600 mg via ORAL
  Filled 2013-01-07 (×9): qty 1

## 2013-01-07 MED ORDER — WITCH HAZEL-GLYCERIN EX PADS: 1.0000 "application " | MEDICATED_PAD | CUTANEOUS | Status: DC | PRN

## 2013-01-07 MED ORDER — TETANUS-DIPHTH-ACELL PERTUSSIS 5-2.5-18.5 LF-MCG/0.5 IM SUSP
0.5000 mL | Freq: Once | INTRAMUSCULAR | Status: AC
Start: 2013-01-08 — End: 2013-01-08
  Administered 2013-01-08: 0.5 mL via INTRAMUSCULAR
  Filled 2013-01-07: qty 0.5

## 2013-01-07 MED ORDER — OXYCODONE-ACETAMINOPHEN 5-325 MG PO TABS
1.0000 | ORAL_TABLET | ORAL | Status: DC | PRN
  Administered 2013-01-07: 1 via ORAL
  Filled 2013-01-07: qty 1

## 2013-01-07 MED ORDER — PRENATAL MULTIVITAMIN CH
1.0000 | ORAL_TABLET | Freq: Every day | ORAL | Status: DC
  Administered 2013-01-07 – 2013-01-08 (×2): 1 via ORAL
  Filled 2013-01-07 (×2): qty 1

## 2013-01-07 MED ORDER — SIMETHICONE 80 MG PO CHEW: 80.0000 mg | CHEWABLE_TABLET | ORAL | Status: DC | PRN

## 2013-01-07 MED ORDER — DIPHENHYDRAMINE HCL 25 MG PO CAPS: 25.0000 mg | ORAL_CAPSULE | Freq: Four times a day (QID) | ORAL | Status: DC | PRN

## 2013-01-07 MED ORDER — DIBUCAINE 1 % RE OINT: 1.0000 "application " | TOPICAL_OINTMENT | RECTAL | Status: DC | PRN

## 2013-01-07 MED ORDER — LANOLIN HYDROUS EX OINT: TOPICAL_OINTMENT | CUTANEOUS | Status: DC | PRN

## 2013-01-07 MED ORDER — ONDANSETRON HCL 4 MG PO TABS: 4.0000 mg | ORAL_TABLET | ORAL | Status: DC | PRN

## 2013-01-07 MED ORDER — SENNOSIDES-DOCUSATE SODIUM 8.6-50 MG PO TABS
2.0000 | ORAL_TABLET | Freq: Every day | ORAL | Status: DC
  Administered 2013-01-07: 2 via ORAL

## 2013-01-07 NOTE — Anesthesia Postprocedure Evaluation (Signed)
  Anesthesia Post-op Note  Anesthesia Post Note  Patient: Linda Warner  Procedure(s) Performed: * No procedures listed *  Anesthesia type: Epidural  Patient location: Mother/Baby  Post pain: Pain level controlled  Post assessment: Post-op Vital signs reviewed  Last Vitals:  Filed Vitals:   01/07/13 0620  BP: 130/88  Pulse: 112  Temp: 36.6 C  Resp: 20    Post vital signs: Reviewed  Level of consciousness:alert  Complications: No apparent anesthesia complications

## 2013-01-08 NOTE — Progress Notes (Signed)
Post Partum Day 1 Subjective: Eating, drinking, voiding, ambulating well.  +flatus.  Lochia and pain wnl.  Denies dizziness, lightheadedness, or sob. No complaints.  Declines early d/c  Objective: Blood pressure 107/70, pulse 82, temperature 97.5 F (36.4 C), temperature source Oral, resp. rate 18, height 5' 2.5" (1.588 m), weight 94.462 kg (208 lb 4 oz), last menstrual period 04/10/2012, SpO2 100.00%, unknown if currently breastfeeding.  Physical Exam:  General: alert, cooperative and no distress Lochia: appropriate Uterine Fundus: firm Incision: n/a DVT Evaluation: No evidence of DVT seen on physical exam. Negative Homan's sign. No cords or calf tenderness. No significant calf/ankle edema.   Recent Labs  01/06/13 1140  HGB 11.3*  HCT 32.6*    Assessment/Plan: Plan for d/c tomorrow Bottlefeeding Mirena for contraception OP circumcision at FT   LOS: 2 days   Marge Duncans 01/08/2013, 8:24 AM

## 2013-01-09 MED ORDER — IBUPROFEN 600 MG PO TABS: 600.0000 mg | ORAL_TABLET | Freq: Four times a day (QID) | ORAL | Status: DC

## 2013-01-09 NOTE — Discharge Summary (Signed)
Obstetric Discharge Summary Reason for Admission: latent labor and NRFHT Prenatal Procedures: none Intrapartum Procedures: spontaneous vaginal delivery and GBS prophylaxis Postpartum Procedures: none Complications-Operative and Postpartum: 1st degree vaginal Hemoglobin  Date Value Range Status  01/06/2013 11.3* 12.0 - 15.0 g/dL Final     HCT  Date Value Range Status  01/06/2013 32.6* 36.0 - 46.0 % Final    Physical Exam:  General: alert, cooperative and no distress Lochia: appropriate Uterine Fundus: firm Incision: na DVT Evaluation: No evidence of DVT seen on physical exam.  Discharge Diagnoses: Term Pregnancy-delivered  Discharge Information: Date: 01/09/2013 Activity: pelvic rest Diet: routine Medications: PNV and Ibuprofen Condition: stable Instructions: refer to practice specific booklet Discharge to: home Follow-up Information   Follow up with [facility]. Schedule an appointment as soon as possible for a visit in 4 weeks.   Contact information:   [address] [place] [address] [phone]      Newborn Data: Live born female  Birth Weight: 7 lb 5.6 oz (3335 g) APGAR: 9, 9  Home with mother.   Pt presented with regular contractions in latent labor with a nonreassuring fetal heart tracing and was augmented without difficulty. She delivered a liveborn female on 01/07/13.  Post partum care was uncomplicated. She is bottle feeding and desires mirena for contraception.   Lynnette Pote L 01/09/2013, 7:43 AM

## 2013-01-09 NOTE — Progress Notes (Signed)
Ur chart review completed.  

## 2013-01-12 ENCOUNTER — Encounter: Payer: Medicaid Other | Admitting: Obstetrics & Gynecology

## 2013-01-12 ENCOUNTER — Ambulatory Visit: Payer: Medicaid Other | Admitting: Adult Health

## 2013-01-30 ENCOUNTER — Encounter: Payer: Self-pay | Admitting: Adult Health

## 2013-01-30 ENCOUNTER — Ambulatory Visit (INDEPENDENT_AMBULATORY_CARE_PROVIDER_SITE_OTHER): Payer: Medicaid Other | Admitting: Adult Health

## 2013-01-30 VITALS — BP 120/80 | Ht 62.0 in | Wt 182.0 lb

## 2013-01-30 DIAGNOSIS — O99345 Other mental disorders complicating the puerperium: Secondary | ICD-10-CM

## 2013-01-30 DIAGNOSIS — F53 Postpartum depression: Secondary | ICD-10-CM

## 2013-01-30 HISTORY — DX: Postpartum depression: F53.0

## 2013-01-30 NOTE — Assessment & Plan Note (Signed)
Depression score 14 will rx lexapro

## 2013-01-30 NOTE — Patient Instructions (Addendum)

## 2013-01-30 NOTE — Progress Notes (Signed)
Subjective:     Patient ID: Linda Warner, female   DOB: 1990/07/25, 22 y.o.   MRN: [id number]  HPI Linda Warner is a 22 year old black female who delivered 01/07/13 baby boy and she is here for?postpatrum depression.Was teary at times and has been snappy, not sleeping well, no sex yet and is bottle feeding.Lives with FOB and he works 4 pm to 12:30 am.Denies hurting self or baby.  Review of Systems Positives in HPI   Reviewed past medical,surgical, social and family history. Reviewed medications and allergies.  Objective:   Physical Exam BP 120/80  Ht 5\' 2"  (1.575 m)  Wt 182 lb (82.555 kg)  BMI 33.28 kg/m2   Edinburgh Postnatal depression Scale14, discussed taking time for self and asking FOB for help, talked about medication and counseling, will try meds first.  Assessment:    Postpartum depression    Plan:     Rx lexapro 10 mg #30 1 daily with 6 refills Review handout on postpartum depression Follow up in 2 weeks, call prn

## 2013-02-01 ENCOUNTER — Telehealth: Payer: Self-pay | Admitting: Adult Health

## 2013-02-01 MED ORDER — ESCITALOPRAM OXALATE 10 MG PO TABS: 10.0000 mg | ORAL_TABLET | Freq: Every day | ORAL | Status: DC

## 2013-02-01 NOTE — Telephone Encounter (Signed)
Called drug store and they did not have rx spoke with pharmacist  And called lexapro 10mg  in

## 2013-02-13 ENCOUNTER — Ambulatory Visit (INDEPENDENT_AMBULATORY_CARE_PROVIDER_SITE_OTHER): Payer: Medicaid Other | Admitting: Adult Health

## 2013-02-13 ENCOUNTER — Encounter: Payer: Self-pay | Admitting: Adult Health

## 2013-02-13 VITALS — BP 120/76 | Ht 63.0 in | Wt 176.0 lb

## 2013-02-13 DIAGNOSIS — R21 Rash and other nonspecific skin eruption: Secondary | ICD-10-CM

## 2013-02-13 DIAGNOSIS — F53 Postpartum depression: Secondary | ICD-10-CM

## 2013-02-13 DIAGNOSIS — O99345 Other mental disorders complicating the puerperium: Secondary | ICD-10-CM

## 2013-02-13 MED ORDER — PREDNISONE 20 MG PO TABS: ORAL_TABLET | ORAL | Status: DC

## 2013-02-13 NOTE — Patient Instructions (Addendum)
Rash A rash is a change in the color or texture of your skin. There are many different types of rashes. You may have other problems that accompany your rash. CAUSES   Infections.  Allergic reactions. This can include allergies to pets or foods.  Certain medicines.  Exposure to certain chemicals, soaps, or cosmetics.  Heat.  Exposure to poisonous plants.  Tumors, both cancerous and noncancerous. SYMPTOMS   Redness.  Scaly skin.  Itchy skin.  Dry or cracked skin.  Bumps.  Blisters.  Pain. DIAGNOSIS  Your caregiver may do a physical exam to determine what type of rash you have. A skin sample (biopsy) may be taken and examined under a microscope. TREATMENT  Treatment depends on the type of rash you have. Your caregiver may prescribe certain medicines. For serious conditions, you may need to see a skin doctor (dermatologist). HOME CARE INSTRUCTIONS   Avoid the substance that caused your rash.  Do not scratch your rash. This can cause infection.  You may take cool baths to help stop itching.  Only take over-the-counter or prescription medicines as directed by your caregiver.  Keep all follow-up appointments as directed by your caregiver. SEEK IMMEDIATE MEDICAL CARE IF:  You have increasing pain, swelling, or redness.  You have a fever.  You have new or severe symptoms.  You have body aches, diarrhea, or vomiting.  Your rash is not better after 3 days. MAKE SURE YOU:  Understand these instructions.  Will watch your condition.  Will get help right away if you are not doing well or get worse. Document Released: 04/24/2002 Document Revised: 07/27/2011 Document Reviewed: 02/16/2011 [facility] Patient Information 2014 [place], [place]. Take zantac 150 mg bid x 10 days can take benadryl Follow up as scheduled Resume lexapro

## 2013-02-13 NOTE — Progress Notes (Signed)
Subjective:     Patient ID: Linda Warner, female   DOB: [birth year], 22 y.o.   MRN: [id number]  HPI Deirdre is back in follow up for post partum depression and she feels better except she has itchy rash on palms and soles and face and ears that started yesterday.Did not take lexapro yesterday, but had been taking some ibuprofen.  Review of Systems See HPI Reviewed past medical,surgical, social and family history. Reviewed medications and allergies.     Objective:   Physical Exam BP 120/76  Ht 5\' 3"  (1.6 m)  Wt 176 lb (79.833 kg)  BMI 31.18 kg/m2  LMP 02/12/2013  Breastfeeding? NoHas red rash on hands,feet and face that itches, her EPDS score is 3 which is down from 14, 2 weeks ago. Dr Despina Hidden thinks it is drug rash too.    Assessment:    Postpartum depression ? Drug rash    Plan:     Resume lexapro Rx prednisone 40 mg x 10 days Take zantac and benadryl Follow up October 8 for postpartum visit and review handouts on IUD and nexplanon and if wants them call for appt while on period, no sex

## 2013-02-17 ENCOUNTER — Ambulatory Visit: Payer: Medicaid Other | Admitting: Obstetrics and Gynecology

## 2013-02-22 ENCOUNTER — Encounter: Payer: Self-pay | Admitting: Advanced Practice Midwife

## 2013-02-22 ENCOUNTER — Ambulatory Visit (INDEPENDENT_AMBULATORY_CARE_PROVIDER_SITE_OTHER): Payer: Medicaid Other | Admitting: Advanced Practice Midwife

## 2013-02-22 DIAGNOSIS — O9081 Anemia of the puerperium: Secondary | ICD-10-CM

## 2013-02-22 NOTE — Progress Notes (Signed)
  Linda Warner is a 22 y.o. who presents for a postpartum visit. She is 6 weeks postpartum following a spontaneous vaginal delivery. I have fully reviewed the prenatal and intrapartum course. The delivery was at 38.6 gestational weeks.  Anesthesia: epidural. Postpartum course has been complicated by PPD.  She was started on LExapro 10mg  qd, which has helped significantly. Baby's course has been uneventful. Baby is feeding by bottle. Bleeding: no bleeding. Had first period ~ 10 days ago. Bowel function is normal. Bladder function is normal. Patient is not sexually active. Contraception method is IUD. Postpartum depression screening: negative.    Review of Systems   Constitutional: Negative for fever and chills Eyes: Negative for visual disturbances Respiratory: Negative for shortness of breath, dyspnea Cardiovascular: Negative for chest pain or palpitations  Gastrointestinal: Negative for vomiting, diarrhea and constipation Genitourinary: Negative for dysuria and urgency,  C/O vaginal odor since birth.  Smells mildewish per pt Musculoskeletal: Negative for back pain, joint pain, myalgias  Neurological: Negative for dizziness and headaches   Objective:     Filed Vitals:   02/22/13 1131  BP: 120/80   General:  alert, cooperative and no distress   Breasts:  negative  Lungs: clear to auscultation bilaterally  Heart:  regular rate and rhythm  Abdomen: Soft, nontender   Vulva:  normal  Vagina: normal vagina,  No odor, wet prep negative  Cervix:  closed  Corpus: Well involuted     Rectal Exam: no hemorrhoids        Assessment:    normal postpartum exam.  Plan:    1. Contraception: IUD 2. Follow up in: asap for IUD or as needed.

## 2013-02-27 ENCOUNTER — Encounter: Payer: Self-pay | Admitting: Obstetrics and Gynecology

## 2013-02-27 ENCOUNTER — Ambulatory Visit (INDEPENDENT_AMBULATORY_CARE_PROVIDER_SITE_OTHER): Payer: Medicaid Other | Admitting: Obstetrics and Gynecology

## 2013-02-27 VITALS — BP 128/78 | Ht 62.0 in | Wt 181.4 lb

## 2013-02-27 DIAGNOSIS — Z3202 Encounter for pregnancy test, result negative: Secondary | ICD-10-CM

## 2013-02-27 DIAGNOSIS — Z30430 Encounter for insertion of intrauterine contraceptive device: Secondary | ICD-10-CM

## 2013-02-27 DIAGNOSIS — Z30432 Encounter for removal of intrauterine contraceptive device: Secondary | ICD-10-CM | POA: Insufficient documentation

## 2013-02-27 DIAGNOSIS — Z309 Encounter for contraceptive management, unspecified: Secondary | ICD-10-CM

## 2013-02-27 NOTE — Progress Notes (Signed)
Patient identified, informed consent performed, signed copy in chart, time out was performed.  Urine pregnancy test negative.  Speculum placed in the vagina.  Cervix visualized.  Cleaned with Betadine x 2.  Grasped anteriourly with a single tooth tenaculum.  Uterus sounded to 8 cm.  Mirena IUD placed per manufacturer's recommendations.  Strings trimmed to 2 cm.   Patient given post procedure instructions and Mirena care card with expiration date.  Patient is asked to check IUD strings periodically and follow up in 4-6 weeks for IUD check.

## 2013-02-27 NOTE — Patient Instructions (Signed)
Levonorgestrel intrauterine device (IUD) What is this medicine? LEVONORGESTREL IUD ([place]ig[place] voe nor jes trel) is a contraceptive (birth control) device. The device is placed inside the uterus by a healthcare professional. It is used to prevent pregnancy and can also be used to treat heavy bleeding that occurs during your period. Depending on the device, it can be used for 3 to 5 years. This medicine may be used for other purposes; ask your health care provider or pharmacist if you have questions. What should I tell my health care provider before I take this medicine? They need to know if you have any of these conditions: -abnormal Pap smear -cancer of the breast, uterus, or cervix -diabetes -endometritis -genital or pelvic infection now or in the past -have more than one sexual partner or your partner has more than one partner -heart disease -history of an ectopic or tubal pregnancy -immune system problems -IUD in place -liver disease or tumor -problems with blood clots or take blood-thinners -use intravenous drugs -uterus of unusual shape -vaginal bleeding that has not been explained -an unusual or allergic reaction to levonorgestrel, other hormones, silicone, or polyethylene, medicines, foods, dyes, or preservatives -pregnant or trying to get pregnant -breast-feeding How should I use this medicine? This device is placed inside the uterus by a health care professional. Talk to your pediatrician regarding the use of this medicine in children. Special care may be needed. Overdosage: If you think you have taken too much of this medicine contact a poison control center or emergency room at once. NOTE: This medicine is only for you. Do not share this medicine with others. What if I miss a dose? This does not apply. What may interact with this medicine? Do not take this medicine with any of the following medications: -amprenavir -bosentan -fosamprenavir This medicine may also interact with  the following medications: -aprepitant -barbiturate medicines for inducing sleep or treating seizures -bexarotene -griseofulvin -medicines to treat seizures like carbamazepine, ethotoin, felbamate, oxcarbazepine, phenytoin, topiramate -modafinil -pioglitazone -rifabutin -rifampin -rifapentine -some medicines to treat HIV infection like atazanavir, indinavir, lopinavir, nelfinavir, tipranavir, ritonavir -St. Sarie Stall's wort -warfarin This list may not describe all possible interactions. Give your health care provider a list of all the medicines, herbs, non-prescription drugs, or dietary supplements you use. Also tell them if you smoke, drink alcohol, or use illegal drugs. Some items may interact with your medicine. What should I watch for while using this medicine? Visit your doctor or health care professional for regular check ups. See your doctor if you or your partner has sexual contact with others, becomes HIV positive, or gets a sexual transmitted disease. This product does not protect you against HIV infection (AIDS) or other sexually transmitted diseases. You can check the placement of the IUD yourself by reaching up to the top of your vagina with clean fingers to feel the threads. Do not pull on the threads. It is a good habit to check placement after each menstrual period. Call your doctor right away if you feel more of the IUD than just the threads or if you cannot feel the threads at all. The IUD may come out by itself. You may become pregnant if the device comes out. If you notice that the IUD has come out use a backup birth control method like condoms and call your health care provider. Using tampons will not change the position of the IUD and are okay to use during your period. What side effects may I notice from receiving this medicine?   Side effects that you should report to your doctor or health care professional as soon as possible: -allergic reactions like skin rash, itching or  hives, swelling of the face, lips, or tongue -fever, flu-like symptoms -genital sores -high blood pressure -no menstrual period for 6 weeks during use -pain, swelling, warmth in the leg -pelvic pain or tenderness -severe or sudden headache -signs of pregnancy -stomach cramping -sudden shortness of breath -trouble with balance, talking, or walking -unusual vaginal bleeding, discharge -yellowing of the eyes or skin Side effects that usually do not require medical attention (report to your doctor or health care professional if they continue or are bothersome): -acne -breast pain -change in sex drive or performance -changes in weight -cramping, dizziness, or faintness while the device is being inserted -headache -irregular menstrual bleeding within first 3 to 6 months of use -nausea This list may not describe all possible side effects. Call your doctor for medical advice about side effects. You may report side effects to FDA at 1-800-FDA-1088. Where should I keep my medicine? This does not apply. NOTE: This sheet is a summary. It may not cover all possible information. If you have questions about this medicine, talk to your doctor, pharmacist, or health care provider.  2013, [employer]. (06/04/2011 1:54:04 PM)  

## 2013-03-01 ENCOUNTER — Telehealth: Payer: Self-pay | Admitting: Obstetrics and Gynecology

## 2013-03-01 NOTE — Telephone Encounter (Signed)
Per Cyril Mourning, NP headaches and eye pain is not a common side effect from IUD. Pt verbalized understanding.

## 2013-03-23 ENCOUNTER — Other Ambulatory Visit: Payer: Self-pay

## 2013-03-27 ENCOUNTER — Ambulatory Visit: Payer: Medicaid Other | Admitting: Adult Health

## 2013-04-07 ENCOUNTER — Ambulatory Visit (INDEPENDENT_AMBULATORY_CARE_PROVIDER_SITE_OTHER): Payer: Medicaid Other | Admitting: General Practice

## 2013-04-07 ENCOUNTER — Encounter: Payer: Self-pay | Admitting: General Practice

## 2013-04-07 VITALS — BP 129/87 | HR 87 | Temp 98.1°F | Ht 62.0 in | Wt 183.0 lb

## 2013-04-07 DIAGNOSIS — N949 Unspecified condition associated with female genital organs and menstrual cycle: Secondary | ICD-10-CM

## 2013-04-07 DIAGNOSIS — N898 Other specified noninflammatory disorders of vagina: Secondary | ICD-10-CM

## 2013-04-07 DIAGNOSIS — B9689 Other specified bacterial agents as the cause of diseases classified elsewhere: Secondary | ICD-10-CM

## 2013-04-07 DIAGNOSIS — A499 Bacterial infection, unspecified: Secondary | ICD-10-CM

## 2013-04-07 DIAGNOSIS — N760 Acute vaginitis: Secondary | ICD-10-CM

## 2013-04-07 LAB — POCT WET PREP WITH KOH: WBC Wet Prep HPF POC: NEGATIVE

## 2013-04-07 NOTE — Progress Notes (Addendum)
  Subjective:    Patient ID: [name], female    DOB: December 18, 1990, 22 y.o.   MRN: [id number]  HPI Patient presents today with complaints of vaginal odor and discharge. She described the discharge as white and thick. She is unable to describe the odor, but doesn't think it is "fishy". She reports smelling light odor prior to having her baby (2.5 months ago), but odor today is different. Denies OTC medications. Reports having a IUD placed on 03/01/13. Denies cramping, but reports vaginal bleeding for two weeks afterwards.     Review of Systems  Constitutional: Negative for fever and chills.  Respiratory: Negative for chest tightness and shortness of breath.   Cardiovascular: Negative for chest pain and palpitations.  Gastrointestinal: Negative for nausea, vomiting and abdominal pain.  Genitourinary: Positive for vaginal discharge. Negative for dysuria and difficulty urinating.       Whitish vaginal discharge  Neurological: Negative for dizziness, weakness and headaches.       Objective:   Physical Exam  Constitutional: She is oriented to person, place, and time. She appears well-developed and well-nourished.  Cardiovascular: Normal rate, regular rhythm and normal heart sounds.   Pulmonary/Chest: Effort normal and breath sounds normal. No respiratory distress. She exhibits no tenderness.  Abdominal: Soft. Bowel sounds are normal. She exhibits no distension. There is no tenderness.  Neurological: She is alert and oriented to person, place, and time.  Skin: Skin is warm and dry.  Psychiatric: She has a normal mood and affect.    Results for orders placed in visit on 04/07/13  POCT WET PREP WITH KOH      Result Value Range   Trichomonas, UA Negative     Clue Cells Wet Prep HPF POC occ     Epithelial Wet Prep HPF POC mod     Yeast Wet Prep HPF POC rare     Bacteria Wet Prep HPF POC mod     RBC Wet Prep HPF POC 5-7     WBC Wet Prep HPF POC neg           Assessment & Plan:   1. Vaginal odor  - POCT Wet Prep with KOH  2. BV (bacterial vaginosis)  - metroNIDAZOLE (FLAGYL) 500 MG tablet; Take 1 tablet (500 mg total) by mouth 2 (two) times daily.  Dispense: 14 tablet; Refill: 0 -discussed and provided information on BV -discussed importance of proper perineal hygiene -If symptoms worsen or persist, should schedule an appointment for vaginal exam -Patient verbalized understanding -Coralie Keens, FNP-C

## 2013-04-07 NOTE — Patient Instructions (Addendum)
Bacterial Vaginosis Bacterial vaginosis (BV) is a vaginal infection where the normal balance of bacteria in the vagina is disrupted. The normal balance is then replaced by an overgrowth of certain bacteria. There are several different kinds of bacteria that can cause BV. BV is the most common vaginal infection in women of childbearing age. CAUSES   The cause of BV is not fully understood. BV develops when there is an increase or imbalance of harmful bacteria.  Some activities or behaviors can upset the normal balance of bacteria in the vagina and put women at increased risk including:  Having a new sex partner or multiple sex partners.  Douching.  Using an intrauterine device (IUD) for contraception.  It is not clear what role sexual activity plays in the development of BV. However, women that have never had sexual intercourse are rarely infected with BV. Women do not get BV from toilet seats, bedding, swimming pools or from touching objects around them.  SYMPTOMS   Grey vaginal discharge.  A fish-like odor with discharge, especially after sexual intercourse.  Itching or burning of the vagina and vulva.  Burning or pain with urination.  Some women have no signs or symptoms at all. DIAGNOSIS  Your caregiver must examine the vagina for signs of BV. Your caregiver will perform lab tests and look at the sample of vaginal fluid through a microscope. They will look for bacteria and abnormal cells (clue cells), a pH test higher than 4.5, and a positive amine test all associated with BV.  RISKS AND COMPLICATIONS   Pelvic inflammatory disease (PID).  Infections following gynecology surgery.  Developing HIV.  Developing herpes virus. TREATMENT  Sometimes BV will clear up without treatment. However, all women with symptoms of BV should be treated to avoid complications, especially if gynecology surgery is planned. Female partners generally do not need to be treated. However, BV may spread  between female sex partners so treatment is helpful in preventing a recurrence of BV.   BV may be treated with antibiotics. The antibiotics come in either pill or vaginal cream forms. Either can be used with nonpregnant or pregnant women, but the recommended dosages differ. These antibiotics are not harmful to the baby.  BV can recur after treatment. If this happens, a second round of antibiotics will often be prescribed.  Treatment is important for pregnant women. If not treated, BV can cause a premature delivery, especially for a pregnant woman who had a premature birth in the past. All pregnant women who have symptoms of BV should be checked and treated.  For chronic reoccurrence of BV, treatment with a type of prescribed gel vaginally twice a week is helpful. HOME CARE INSTRUCTIONS   Finish all medication as directed by your caregiver.  Do not have sex until treatment is completed.  Tell your sexual partner that you have a vaginal infection. They should see their caregiver and be treated if they have problems, such as a mild rash or itching.  Practice safe sex. Use condoms. Only have 1 sex partner. PREVENTION  Basic prevention steps can help reduce the risk of upsetting the natural balance of bacteria in the vagina and developing BV:  Do not have sexual intercourse (be abstinent).  Do not douche.  Use all of the medicine prescribed for treatment of BV, even if the signs and symptoms go away.  Tell your sex partner if you have BV. That way, they can be treated, if needed, to prevent reoccurrence. SEEK MEDICAL CARE IF:     Your symptoms are not improving after 3 days of treatment.  You have increased discharge, pain, or fever. MAKE SURE YOU:   Understand these instructions.  Will watch your condition.  Will get help right away if you are not doing well or get worse. FOR MORE INFORMATION  Division of STD Prevention (DSTDP), Centers for Disease Control and Prevention:  www.cdc.gov/std American [employer] Health Association (ASHA): www.ashastd.org  Document Released: 05/04/2005 Document Revised: 07/27/2011 Document Reviewed: 12/14/2012 Exit[employer] Patient Information 2014 [employer].  

## 2013-04-09 MED ORDER — METRONIDAZOLE 500 MG PO TABS: 500.0000 mg | ORAL_TABLET | Freq: Two times a day (BID) | ORAL | Status: DC

## 2013-04-09 NOTE — Addendum Note (Signed)
Addended by: Philomena Doheny E on: 04/09/2013 06:15 PM   Modules accepted: Orders

## 2013-04-10 ENCOUNTER — Telehealth: Payer: Self-pay | Admitting: General Practice

## 2013-05-08 ENCOUNTER — Emergency Department (HOSPITAL_COMMUNITY)
Admission: EM | Admit: 2013-05-08 | Discharge: 2013-05-09 | Disposition: A | Discharge status: Home / Self Care | Payer: Medicaid Other | Attending: Emergency Medicine | Admitting: Emergency Medicine

## 2013-05-08 ENCOUNTER — Encounter (HOSPITAL_COMMUNITY): Payer: Self-pay | Admitting: Emergency Medicine

## 2013-05-08 DIAGNOSIS — R1110 Vomiting, unspecified: Secondary | ICD-10-CM | POA: Insufficient documentation

## 2013-05-08 DIAGNOSIS — R51 Headache: Secondary | ICD-10-CM | POA: Insufficient documentation

## 2013-05-08 DIAGNOSIS — Z8619 Personal history of other infectious and parasitic diseases: Secondary | ICD-10-CM | POA: Insufficient documentation

## 2013-05-08 DIAGNOSIS — F3289 Other specified depressive episodes: Secondary | ICD-10-CM | POA: Insufficient documentation

## 2013-05-08 DIAGNOSIS — Z872 Personal history of diseases of the skin and subcutaneous tissue: Secondary | ICD-10-CM | POA: Insufficient documentation

## 2013-05-08 DIAGNOSIS — F329 Major depressive disorder, single episode, unspecified: Secondary | ICD-10-CM | POA: Insufficient documentation

## 2013-05-08 DIAGNOSIS — Z79899 Other long term (current) drug therapy: Secondary | ICD-10-CM | POA: Insufficient documentation

## 2013-05-08 DIAGNOSIS — Z87891 Personal history of nicotine dependence: Secondary | ICD-10-CM | POA: Insufficient documentation

## 2013-05-08 DIAGNOSIS — Z8742 Personal history of other diseases of the female genital tract: Secondary | ICD-10-CM | POA: Insufficient documentation

## 2013-05-08 DIAGNOSIS — Z8739 Personal history of other diseases of the musculoskeletal system and connective tissue: Secondary | ICD-10-CM | POA: Insufficient documentation

## 2013-05-08 MED ORDER — KETOROLAC TROMETHAMINE 30 MG/ML IJ SOLN
30.0000 mg | Freq: Once | INTRAMUSCULAR | Status: AC
  Administered 2013-05-08: 30 mg via INTRAVENOUS
  Filled 2013-05-08: qty 1

## 2013-05-08 MED ORDER — DIPHENHYDRAMINE HCL 50 MG/ML IJ SOLN
25.0000 mg | Freq: Once | INTRAMUSCULAR | Status: AC
  Administered 2013-05-08: 25 mg via INTRAVENOUS
  Filled 2013-05-08: qty 1

## 2013-05-08 MED ORDER — SODIUM CHLORIDE 0.9 % IV BOLUS (SEPSIS)
1000.0000 mL | Freq: Once | INTRAVENOUS | Status: AC
  Administered 2013-05-08: 1000 mL via INTRAVENOUS

## 2013-05-08 MED ORDER — METOCLOPRAMIDE HCL 5 MG/ML IJ SOLN
10.0000 mg | Freq: Once | INTRAMUSCULAR | Status: AC
  Administered 2013-05-08: 10 mg via INTRAVENOUS
  Filled 2013-05-08: qty 2

## 2013-05-08 NOTE — ED Provider Notes (Signed)
CSN: [id number]     Arrival date & time 05/08/13  2305 History  This chart was scribed for [name], MD by Carl Best, ED Scribe. This patient was seen in room APA03/APA03 and the patient's care was started at 11:19 PM.     Chief Complaint  Patient presents with  . Headache  . Emesis    Patient is a 22 y.o. female presenting with headaches and vomiting. The history is provided by the patient. No language interpreter was used.  Headache Associated symptoms: vomiting   Associated symptoms: no congestion, no ear pain and no fever   Emesis Associated symptoms: headaches    HPI Comments: Linda Warner is a 22 y.o. female who presents to the Emergency Department complaining of constant right-sided headache that started at 12 PM today.  The patient states that she had an episode of emesis today at 10 PM.  She states that she noticed a white patch on her throat.  She denies otalgia, fever, cough, visual disturbances, and congestion as associated symptoms.  She states that moving her head does not aggravate or alleviate the pain.  She states that she took two 500 mg Tylenol at 8 PM.  She states that she has been having weekly headaches since she had her son in August.  She states that this headache is similar to her weekly headaches.  She states that she is normally a healthy person.  She denies taking any medications unnecessarily.  The patient denies breastfeeding her child.  The patient denies coming to the ED for her headaches in the past.  She states that she usually will sleep it off.     Past Medical History  Diagnosis Date  . Acne   . Scoliosis   . Pregnant   . Scoliosis   . BV (bacterial vaginosis)   . 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 Miscarriage    Past Surgical History  Procedure Laterality Date  . No past surgeries     Family History  Problem Relation Age of Onset  . Depression Father   . Depression Maternal Grandmother   . Diabetes Maternal Grandmother   .  Hyperlipidemia Maternal Grandmother   . Hypertension Maternal Grandmother   . Cancer Maternal Grandmother     breast  . Anesthesia problems Neg Hx    History  Substance Use Topics  . Smoking status: Former Smoker    Types: Cigarettes  . Smokeless tobacco: Never Used  . Alcohol Use: No   OB History   Grav Para Term Preterm Abortions TAB SAB Ect Mult Living   2 1 1  1  1   1      Review of Systems  Constitutional: Negative for fever.  HENT: Negative for congestion and ear pain.   Eyes: Negative for visual disturbance.  Gastrointestinal: Positive for vomiting.  Neurological: Positive for headaches.  All other systems reviewed and are negative.    Allergies  Review of patient's allergies indicates no known allergies.  Home Medications   Current Outpatient Rx  Name  Route  Sig  Dispense  Refill  . ibuprofen (ADVIL,MOTRIN) 600 MG tablet   Oral   Take 1 tablet (600 mg total) by mouth every 6 (six) hours.   30 tablet   0   . levonorgestrel (MIRENA) 20 MCG/24HR IUD   Intrauterine   1 each by Intrauterine route once.         . escitalopram (LEXAPRO) 10 MG tablet   Oral  Take 1 tablet (10 mg total) by mouth daily.   30 tablet   6   . metroNIDAZOLE (FLAGYL) 500 MG tablet   Oral   Take 1 tablet (500 mg total) by mouth 2 (two) times daily.   14 tablet   0    Triage Vitals: BP 107/72  Pulse 88  Temp(Src) 98.1 F (36.7 C) (Oral)  Resp 20  Ht 5\' 3"  (1.6 m)  Wt 182 lb (82.555 kg)  BMI 32.25 kg/m2  SpO2 100%  LMP 05/08/2013  Physical Exam  Nursing note and vitals reviewed. Constitutional: She is oriented to person, place, and time. She appears well-developed and well-nourished.  HENT:  Head: Normocephalic and atraumatic.  Eyes: Conjunctivae and EOM are normal. Pupils are equal, round, and reactive to light.  Neck: Normal range of motion and phonation normal. Neck supple.  Cardiovascular: Normal rate, regular rhythm and intact distal pulses.    Pulmonary/Chest: Effort normal and breath sounds normal. She exhibits no tenderness.  Abdominal: Soft.  Musculoskeletal: Normal range of motion.  Neurological: She is alert and oriented to person, place, and time. No cranial nerve deficit. She exhibits normal muscle tone. Coordination normal.  Skin: Skin is warm and dry.  Psychiatric: She has a normal mood and affect. Her behavior is normal. Judgment and thought content normal.    ED Course  Procedures (including critical care time)  DIAGNOSTIC STUDIES: Oxygen Saturation is 100% on room air, normal by my interpretation.    COORDINATION OF CARE: 11:22 PM- Discussed administering a cocktail for her headache in the ED.  The patient agreed to the treatment plan.    Labs Review Labs Reviewed - No data to display Imaging Review No results found.    MDM  No diagnosis found. Patient is a 23 year old female presents here with headache. States she's been having intermittent headaches since the birth of her child in August. This is been worse since today at noon. Her headache was unrelieved with Tylenol so she comes here for evaluation. She denies any injury or trauma. She denies any fever or stiff neck. Her physical examination is unremarkable. She is afebrile there is no nuchal rigidity. There is no papilledema and she appears clinically well. I ordered fluids and a migraine cocktail which was given. She'll after she received these medications she became tearful stated that she wanted the IV out of her arm and just wanted to go home. She tells me that her headache is gone and she wants to leave. At this point I feel this is appropriate she agrees return if her symptoms worsen or change. I see no indication for CT or LP at this point.   I personally performed the services described in this documentation, which was scribed in my presence. The recorded information has been reviewed and is accurate.      [name], MD 05/08/13 [id number]

## 2013-05-08 NOTE — ED Notes (Signed)
Pt c/o headache since 1200 and has vomited once.

## 2013-05-08 NOTE — ED Notes (Signed)
Patient in room crying. states she doesn't feel right.  Will notify md

## 2013-05-31 ENCOUNTER — Telehealth: Payer: Self-pay | Admitting: *Deleted

## 2013-05-31 NOTE — Telephone Encounter (Signed)
Aware of results. 

## 2013-10-09 IMAGING — US US OB TRANSVAGINAL
1 series · 13 of 28 positions shown · non-contrast
Comparison: 05/17/2012.

CLINICAL DATA: 21-year-old G2 P0 AB1, LMP 04/10/2012 ([DATE] weeks 6
days - which is the established gestational age at the
obstetrician's office), presenting with a brown vaginal discharge.

TRANSVAGINAL OBSTETRIC US <14 WEEKS
TECHNIQUE: Transvaginal ultrasound was performed for complete
evaluation of the gestation as well as the maternal uterus, adnexal
regions, and pelvic cul-de-sac.

[Series 1: us ob transvaginal · 13 of 29 slices shown]
[im 2/29]
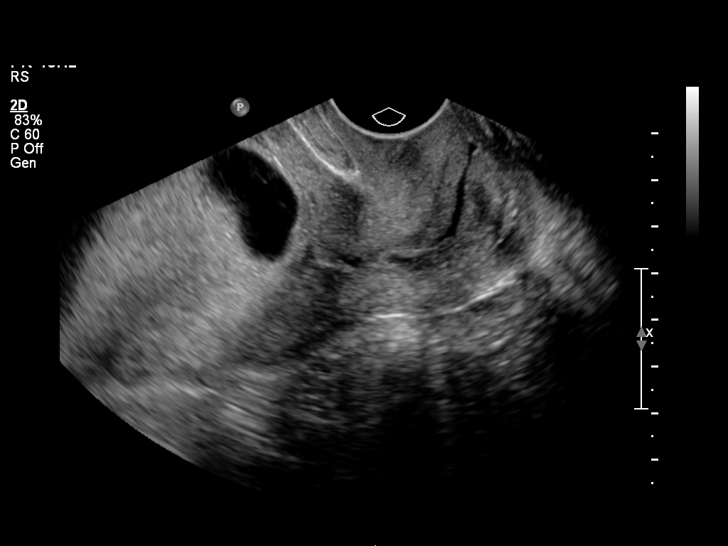
[im 4/29]
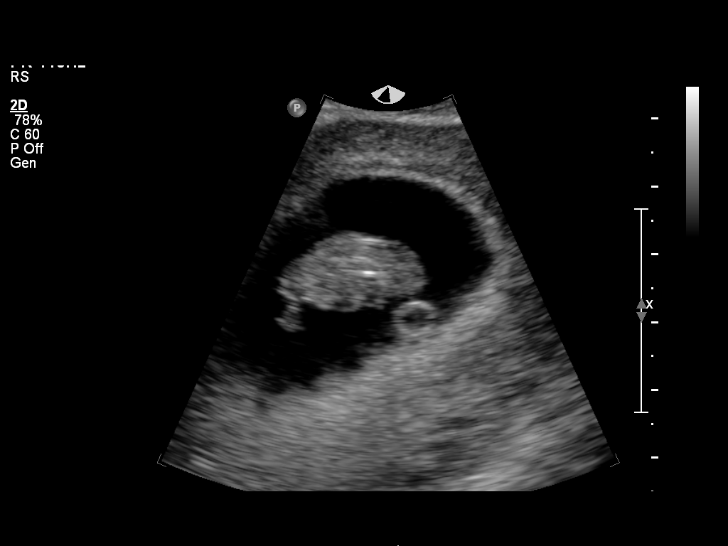
[im 6/29]
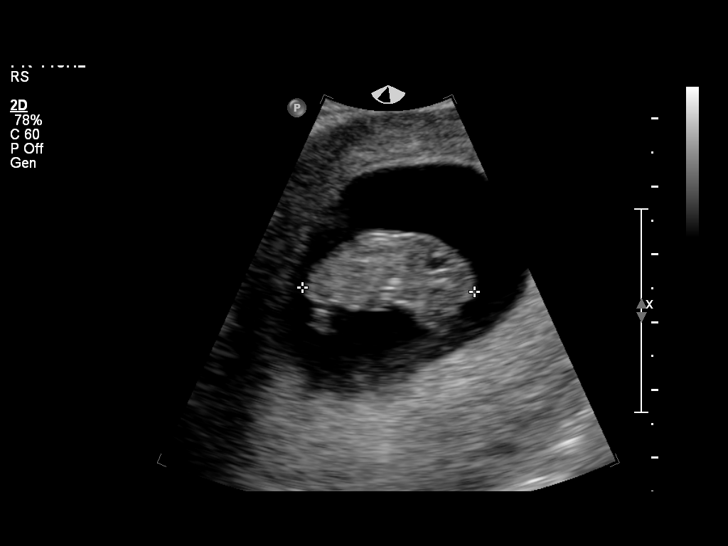
[im 8/29]
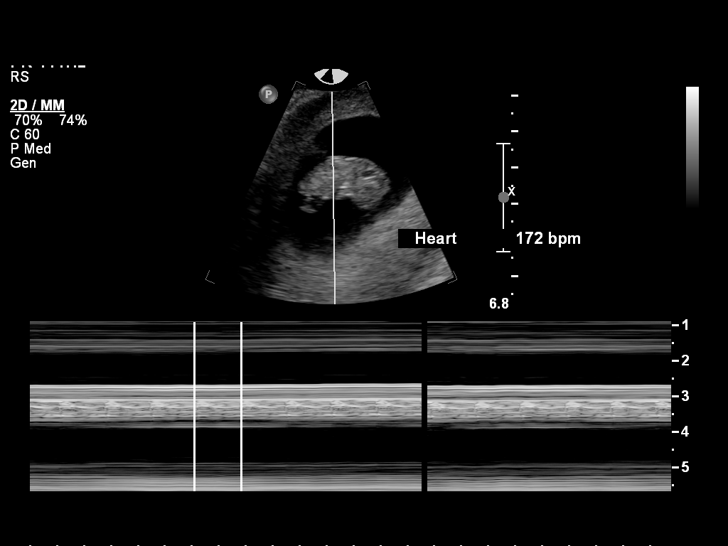
[im 10/29]
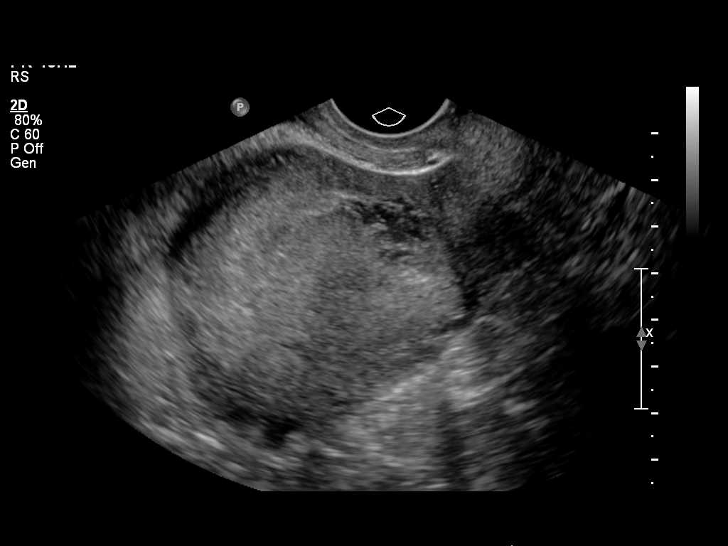
[im 12/29]
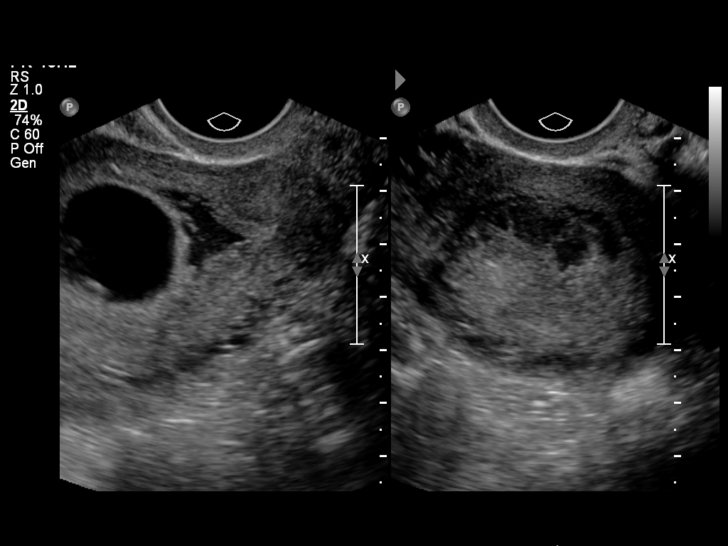
[im 15/29]
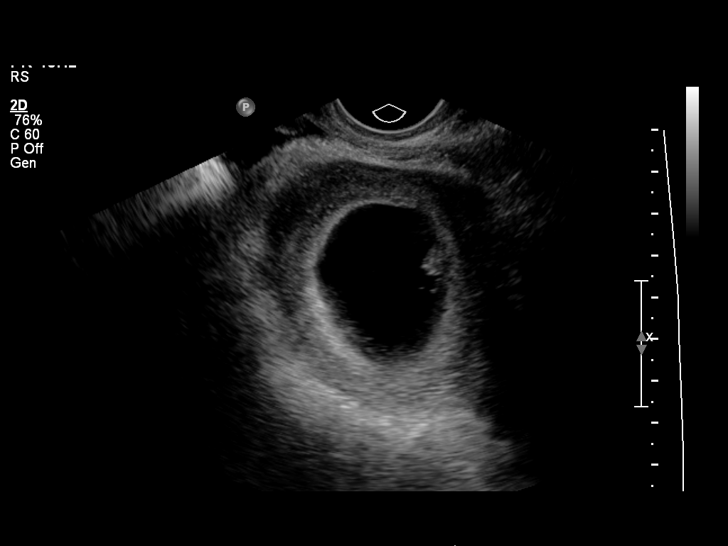
[im 17/29]
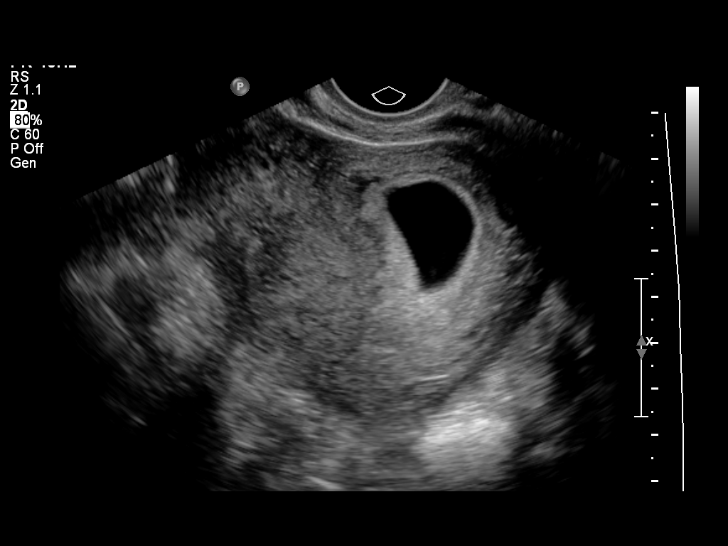
[im 19/29]
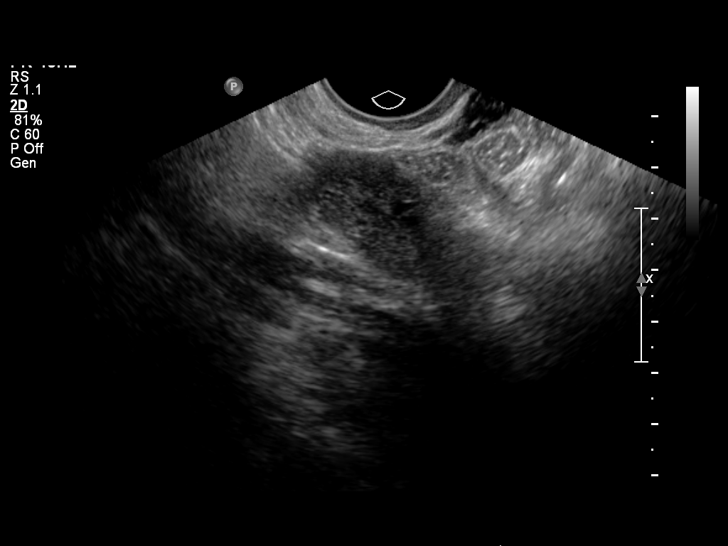
[im 21/29]
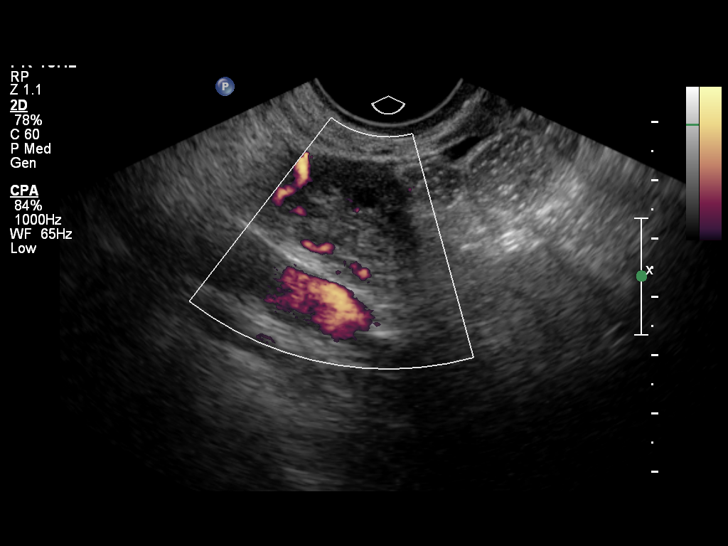
[im 23/29]
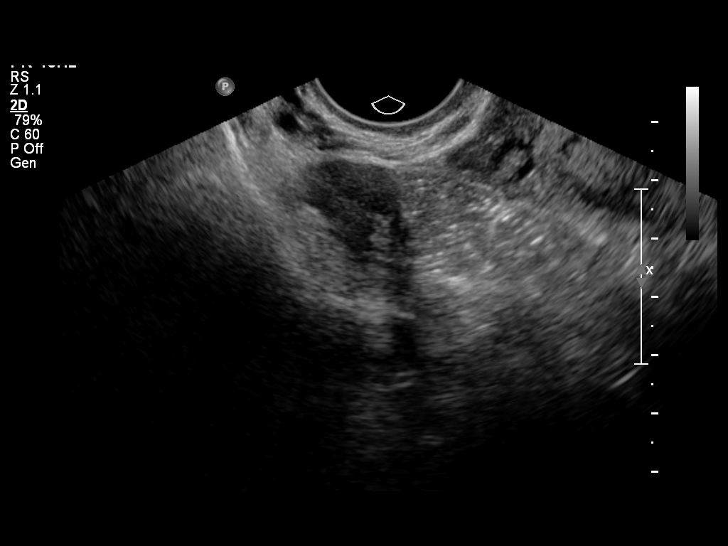
[im 25/29]
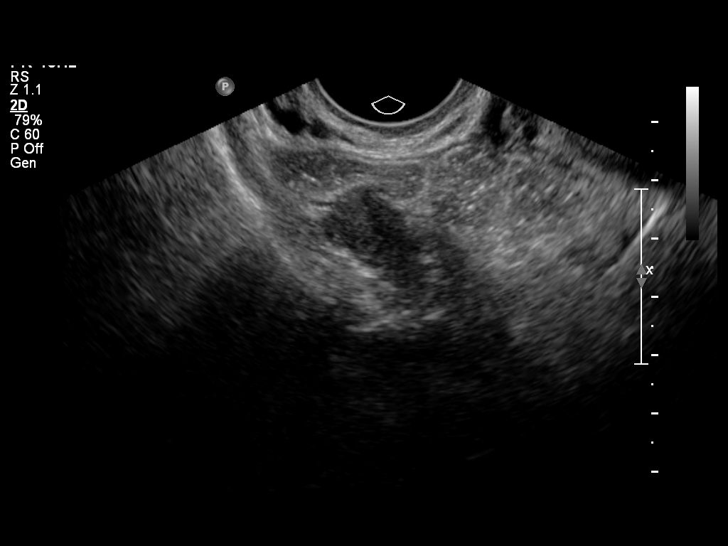
[im 27/29]
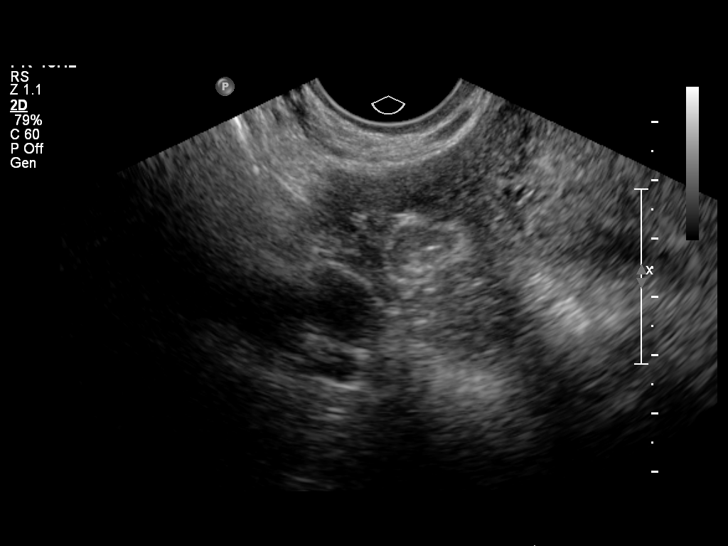

[13 of 28 positions shown; findings below may reference images not displayed]

Intrauterine gestational sac: Single, normal in shape.
Yolk sac: Visualized.
Embryo: Visualized.
Cardiac Activity: Visualized.
Heart Rate: 172 beats per minute

CRL: 25.4 mm           9 w  3 d           US EDC: 01/11/2013.

Subchorionic hemorrhage: Small, measuring approximately 1.2 x 1.3 x
2.0 cm.

Maternal uterus/adnexae:
Normal-appearing right ovary containing small follicles measuring
approximately 3.1 x 1.7 x 1.8 cm.  Normal color Doppler flow.
Nonvisualization of the left ovary.  Normal-appearing closed
cervix.
IMPRESSION: 1.  Single live intrauterine gestation with estimated gestational
age 9 weeks 3 days by crown-rump length.  Assigned gestational age
is 8 weeks 6 days.
2.  Small subchorionic hemorrhage.
3.  Normal-appearing right ovary.  Nonvisualization of the left
ovary.

## 2014-03-16 ENCOUNTER — Telehealth: Payer: Self-pay | Admitting: Nurse Practitioner

## 2014-03-19 ENCOUNTER — Encounter (HOSPITAL_COMMUNITY): Payer: Self-pay | Admitting: Emergency Medicine

## 2014-03-24 ENCOUNTER — Emergency Department (HOSPITAL_COMMUNITY)
Admission: EM | Admit: 2014-03-24 | Discharge: 2014-03-25 | Disposition: A | Discharge status: Home / Self Care | Payer: Medicaid Other | Attending: Emergency Medicine | Admitting: Emergency Medicine

## 2014-03-24 ENCOUNTER — Encounter (HOSPITAL_COMMUNITY): Payer: Self-pay | Admitting: *Deleted

## 2014-03-24 DIAGNOSIS — Z8739 Personal history of other diseases of the musculoskeletal system and connective tissue: Secondary | ICD-10-CM | POA: Insufficient documentation

## 2014-03-24 DIAGNOSIS — Z79899 Other long term (current) drug therapy: Secondary | ICD-10-CM | POA: Insufficient documentation

## 2014-03-24 DIAGNOSIS — Z791 Long term (current) use of non-steroidal anti-inflammatories (NSAID): Secondary | ICD-10-CM | POA: Insufficient documentation

## 2014-03-24 DIAGNOSIS — Z8742 Personal history of other diseases of the female genital tract: Secondary | ICD-10-CM | POA: Insufficient documentation

## 2014-03-24 DIAGNOSIS — Z3202 Encounter for pregnancy test, result negative: Secondary | ICD-10-CM | POA: Insufficient documentation

## 2014-03-24 DIAGNOSIS — F329 Major depressive disorder, single episode, unspecified: Secondary | ICD-10-CM | POA: Insufficient documentation

## 2014-03-24 DIAGNOSIS — L293 Anogenital pruritus, unspecified: Secondary | ICD-10-CM | POA: Insufficient documentation

## 2014-03-24 DIAGNOSIS — Z792 Long term (current) use of antibiotics: Secondary | ICD-10-CM | POA: Insufficient documentation

## 2014-03-24 DIAGNOSIS — Z87891 Personal history of nicotine dependence: Secondary | ICD-10-CM | POA: Insufficient documentation

## 2014-03-24 DIAGNOSIS — N898 Other specified noninflammatory disorders of vagina: Secondary | ICD-10-CM

## 2014-03-24 LAB — URINALYSIS, ROUTINE W REFLEX MICROSCOPIC
Bilirubin Urine: NEGATIVE
GLUCOSE, UA: NEGATIVE mg/dL
Hgb urine dipstick: NEGATIVE
Ketones, ur: NEGATIVE mg/dL
LEUKOCYTES UA: NEGATIVE
Nitrite: NEGATIVE
PH: 7.5 (ref 5.0–8.0)
PROTEIN: NEGATIVE mg/dL
SPECIFIC GRAVITY, URINE: 1.015 (ref 1.005–1.030)
Urobilinogen, UA: 0.2 mg/dL (ref 0.0–1.0)

## 2014-03-24 MED ORDER — DIPHENHYDRAMINE HCL 25 MG PO CAPS
25.0000 mg | ORAL_CAPSULE | Freq: Once | ORAL | Status: AC
  Administered 2014-03-24: 25 mg via ORAL
  Filled 2014-03-24: qty 1

## 2014-03-24 NOTE — ED Notes (Signed)
Pt c/o vaginal itching that started this evening around 6pm

## 2014-03-24 NOTE — ED Provider Notes (Signed)
CSN: [id number]     Arrival date & time 03/24/14  2308 History  This chart was scribed for Sharyon Cable, MD by Peyton Bottoms, ED Scribe. This patient was seen in room APA19/APA19 and the patient's care was started at 11:21 PM.   Chief Complaint  Patient presents with  . Vaginal Itching   Patient is a 23 y.o. female presenting with vaginal itching. The history is provided by the patient. No language interpreter was used.  Vaginal Itching This is a new problem. The current episode started 3 to 5 hours ago. The problem occurs constantly. The problem has not changed since onset.Pertinent negatives include no chest pain, no abdominal pain, no headaches and no shortness of breath. Nothing aggravates the symptoms. Nothing relieves the symptoms. She has tried nothing for the symptoms.    HPI Comments: Linda Warner is a 23 y.o. female who presents to the Emergency Department complaining of vaginal itching that began earlier this evening around 6pm. Patient states that she shaved her pubic area 2 weeks ago and states that the hair is growing back and suspects that her symptoms may be caused by that. She denies associated vaginal bleeding, vaginal discharge, rash to affected area, abdominal pain, fever or vomiting.  Past Medical History  Diagnosis Date  . Acne   . Scoliosis   . Pregnant   . Scoliosis   . BV (bacterial vaginosis)   . 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 Miscarriage    Past Surgical History  Procedure Laterality Date  . No past surgeries     Family History  Problem Relation Age of Onset  . Depression Father   . Depression Maternal Grandmother   . Diabetes Maternal Grandmother   . Hyperlipidemia Maternal Grandmother   . Hypertension Maternal Grandmother   . Cancer Maternal Grandmother     breast  . Anesthesia problems Neg Hx    History  Substance Use Topics  . Smoking status: Former Smoker    Types: Cigarettes  . Smokeless tobacco: Never Used  . Alcohol Use:  No   OB History    Gravida Para Term Preterm AB TAB SAB Ectopic Multiple Living   2 1 1  1  1   1      Review of Systems  Respiratory: Negative for shortness of breath.   Cardiovascular: Negative for chest pain.  Gastrointestinal: Negative for abdominal pain.  Genitourinary: Negative for vaginal bleeding, vaginal discharge and vaginal pain.       Vaginal Itching  Neurological: Negative for headaches.   Allergies  Review of patient's allergies indicates no known allergies.  Home Medications   Prior to Admission medications   Medication Sig Start Date End Date Taking? Authorizing Provider  escitalopram (LEXAPRO) 10 MG tablet Take 1 tablet (10 mg total) by mouth daily. 02/01/13   Estill Dooms, NP  ibuprofen (ADVIL,MOTRIN) 600 MG tablet Take 1 tablet (600 mg total) by mouth every 6 (six) hours. 01/09/13   Kassie Mends, MD  levonorgestrel (MIRENA) 20 MCG/24HR IUD 1 each by Intrauterine route once.    Historical Provider, MD  metroNIDAZOLE (FLAGYL) 500 MG tablet Take 1 tablet (500 mg total) by mouth 2 (two) times daily. 04/09/13   Erby Pian, FNP   Triage Vitals: BP 113/93 mmHg  Pulse 117  Temp(Src) 97.9 F (36.6 C) (Oral)  Resp 18  Ht 5\' 3"  (1.6 m)  Wt 180 lb (81.647 kg)  BMI 31.89 kg/m2  SpO2 100%  Physical Exam  Nursing note and vitals reviewed.  CONSTITUTIONAL: Well developed/well nourished HEAD: Normocephalic/atraumatic ENMT: Mucous membranes moist NECK: supple no meningeal signs CV: S1/S2 noted, no murmurs/rubs/gallops noted LUNGS: Lungs are clear to auscultation bilaterally, no apparent distress ABDOMEN: soft, nontender, no rebound or guarding GU: minimal erythema to suprapubic region.  No abscess or evidence of cellulitis.  No herpetic lesions noted.  Female chaperone present NEURO: Pt is awake/alert, moves all extremitiesx4 EXTREMITIES: pulses normal, full ROM SKIN: warm, color normal PSYCH: no abnormalities of mood noted  ED Course  Procedures    DIAGNOSTIC STUDIES: Oxygen Saturation is 100% on RA, normal by my interpretation.    COORDINATION OF CARE: 11:25 PM- Discussed plans to order diagnostic lab work and perform pelvic exam. Pt advised of plan for treatment and pt agrees.  Labs Review Labs Reviewed  URINALYSIS, ROUTINE W REFLEX MICROSCOPIC  PREGNANCY, URINE  POC URINE PREG, ED    MDM   Final diagnoses:  Vaginal itching    Nursing notes including past medical history and social history reviewed and considered in documentation Labs/vital reviewed and considered   I personally performed the services described in this documentation, which was scribed in my presence. The recorded information has been reviewed and is accurate.  Sharyon Cable, MD 03/24/14 [phone]

## 2014-03-24 NOTE — Discharge Instructions (Signed)
Pruritus  °Pruritus is an itch. There are many different problems that can cause an itch. Dry skin is one of the most common causes of itching. Most cases of itching do not require medical attention.  °HOME CARE INSTRUCTIONS  °Make sure your skin is moistened on a regular basis. A moisturizer that contains petroleum jelly is best for keeping moisture in your skin. If you develop a rash, you may try the following for relief:  °· Use corticosteroid cream. °· Apply cool compresses to the affected areas. °· Bathe with Epsom salts or baking soda in the bathwater. °· Soak in colloidal oatmeal baths. These are available at your pharmacy. °· Apply baking soda paste to the rash. Stir water into baking soda until it reaches a paste-like consistency. °· Use an anti-itch lotion. °· Take over-the-counter diphenhydramine medicine by mouth as the instructions direct. °· Avoid scratching. Scratching may cause the rash to become infected. If itching is very bad, your caregiver may suggest prescription lotions or creams to lessen your symptoms. °· Avoid hot showers, which can make itching worse. A cold shower may help with itching as long as you use a moisturizer after the shower. °SEEK MEDICAL CARE IF: °The itching does not go away after several days. °Document Released: 01/14/2011 Document Revised: 09/18/2013 Document Reviewed: 01/14/2011 °ExitCare® Patient Information ©2015 [employer]. This information is not intended to replace advice given to you by your health care provider. Make sure you discuss any questions you have with your health care provider. ° °

## 2014-03-25 LAB — PREGNANCY, URINE: PREG TEST UR: NEGATIVE

## 2014-03-27 ENCOUNTER — Other Ambulatory Visit: Payer: Self-pay | Admitting: Adult Health

## 2014-03-29 ENCOUNTER — Ambulatory Visit: Payer: Self-pay | Admitting: Obstetrics and Gynecology

## 2014-12-04 ENCOUNTER — Encounter (HOSPITAL_COMMUNITY): Payer: Self-pay | Admitting: Emergency Medicine

## 2014-12-04 ENCOUNTER — Emergency Department (HOSPITAL_COMMUNITY)
Admission: EM | Admit: 2014-12-04 | Discharge: 2014-12-04 | Disposition: A | Discharge status: Home / Self Care | Payer: Medicaid Other | Attending: Emergency Medicine | Admitting: Emergency Medicine

## 2014-12-04 DIAGNOSIS — Z8619 Personal history of other infectious and parasitic diseases: Secondary | ICD-10-CM | POA: Insufficient documentation

## 2014-12-04 DIAGNOSIS — N390 Urinary tract infection, site not specified: Secondary | ICD-10-CM | POA: Insufficient documentation

## 2014-12-04 DIAGNOSIS — Z87891 Personal history of nicotine dependence: Secondary | ICD-10-CM | POA: Insufficient documentation

## 2014-12-04 DIAGNOSIS — Z8739 Personal history of other diseases of the musculoskeletal system and connective tissue: Secondary | ICD-10-CM | POA: Insufficient documentation

## 2014-12-04 DIAGNOSIS — Z8742 Personal history of other diseases of the female genital tract: Secondary | ICD-10-CM | POA: Insufficient documentation

## 2014-12-04 DIAGNOSIS — Z8659 Personal history of other mental and behavioral disorders: Secondary | ICD-10-CM | POA: Insufficient documentation

## 2014-12-04 DIAGNOSIS — Z872 Personal history of diseases of the skin and subcutaneous tissue: Secondary | ICD-10-CM | POA: Insufficient documentation

## 2014-12-04 LAB — URINALYSIS, ROUTINE W REFLEX MICROSCOPIC
BILIRUBIN URINE: NEGATIVE
GLUCOSE, UA: NEGATIVE mg/dL
KETONES UR: NEGATIVE mg/dL
Nitrite: NEGATIVE
PH: 6 (ref 5.0–8.0)
Protein, ur: 30 mg/dL — AB
Urobilinogen, UA: 0.2 mg/dL (ref 0.0–1.0)

## 2014-12-04 LAB — URINE MICROSCOPIC-ADD ON

## 2014-12-04 MED ORDER — PHENAZOPYRIDINE HCL 200 MG PO TABS: 200.0000 mg | ORAL_TABLET | Freq: Three times a day (TID) | ORAL | Status: DC

## 2014-12-04 MED ORDER — CEPHALEXIN 500 MG PO CAPS: 500.0000 mg | ORAL_CAPSULE | Freq: Four times a day (QID) | ORAL | Status: DC

## 2014-12-04 MED ORDER — CEPHALEXIN 500 MG PO CAPS
500.0000 mg | ORAL_CAPSULE | Freq: Once | ORAL | Status: AC
  Administered 2014-12-04: 500 mg via ORAL
  Filled 2014-12-04: qty 1

## 2014-12-04 MED ORDER — PHENAZOPYRIDINE HCL 100 MG PO TABS
200.0000 mg | ORAL_TABLET | Freq: Once | ORAL | Status: AC
  Administered 2014-12-04: 200 mg via ORAL
  Filled 2014-12-04: qty 2

## 2014-12-04 NOTE — Discharge Instructions (Signed)

## 2014-12-04 NOTE — ED Notes (Signed)
Patient with no complaints at this time. Respirations even and unlabored. Skin warm/dry. Discharge instructions reviewed with patient at this time. Patient given opportunity to voice concerns/ask questions. Patient discharged at this time and left Emergency Department with steady gait.   

## 2014-12-04 NOTE — ED Notes (Signed)
Vaginal odor and urine frequency.

## 2014-12-06 NOTE — ED Provider Notes (Signed)
CSN: [id number]     Arrival date & time 12/04/14  1840 History   First MD Initiated Contact with Patient 12/04/14 1858     Chief Complaint  Patient presents with  . Urinary Tract Infection     (Consider location/radiation/quality/duration/timing/severity/associated sxs/prior Treatment) The history is provided by the patient.   Linda Warner is a 24 y.o. female presenting with a several day history of increased urinary frequency of small amounts of dark, odorous urine. She endorses urgency and painful urination limited to the end of her urine stream along with persistent suprapubic pressure.  She denies fevers, chills, nausea, vomiting and flank pain. She denies vaginal discharge.  She has found no alleviators for her symptoms, but has increased fluid intake.    Past Medical History  Diagnosis Date  . Acne   . Scoliosis   . Pregnant   . Scoliosis   . BV (bacterial vaginosis)   . 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 Miscarriage    Past Surgical History  Procedure Laterality Date  . No past surgeries     Family History  Problem Relation Age of Onset  . Depression Father   . Depression Maternal Grandmother   . Diabetes Maternal Grandmother   . Hyperlipidemia Maternal Grandmother   . Hypertension Maternal Grandmother   . Cancer Maternal Grandmother     breast  . Anesthesia problems Neg Hx    History  Substance Use Topics  . Smoking status: Former Smoker    Types: Cigarettes  . Smokeless tobacco: Never Used  . Alcohol Use: No   OB History    Gravida Para Term Preterm AB TAB SAB Ectopic Multiple Living   2 1 1  1  1   1      Review of Systems  Constitutional: Negative for fever.  HENT: Negative for congestion and sore throat.   Eyes: Negative.   Respiratory: Negative for chest tightness and shortness of breath.   Cardiovascular: Negative for chest pain.  Gastrointestinal: Negative for nausea and abdominal pain.  Genitourinary: Positive for dysuria, urgency and  frequency. Negative for hematuria, flank pain and vaginal discharge.  Musculoskeletal: Negative for joint swelling, arthralgias and neck pain.  Skin: Negative.  Negative for rash and wound.  Neurological: Negative for dizziness, weakness, light-headedness, numbness and headaches.  Psychiatric/Behavioral: Negative.       Allergies  Review of patient's allergies indicates no known allergies.  Home Medications   Prior to Admission medications   Medication Sig Start Date End Date Taking? Authorizing Provider  levonorgestrel (MIRENA) 20 MCG/24HR IUD 1 each by Intrauterine route once.   Yes Historical Provider, MD  cephALEXin (KEFLEX) 500 MG capsule Take 1 capsule (500 mg total) by mouth 4 (four) times daily. 12/04/14   Evalee Jefferson, PA-C  phenazopyridine (PYRIDIUM) 200 MG tablet Take 1 tablet (200 mg total) by mouth 3 (three) times daily. 12/04/14   Evalee Jefferson, PA-C   BP 145/86 mmHg  Pulse 112  Temp(Src) 98.9 F (37.2 C) (Oral)  Resp 20  Ht 5\' 2"  (1.575 m)  Wt 190 lb (86.183 kg)  BMI 34.74 kg/m2  SpO2 100%  LMP 11/14/2014 Physical Exam  Constitutional: She appears well-developed and well-nourished.  HENT:  Head: Normocephalic and atraumatic.  Eyes: Conjunctivae are normal.  Neck: Normal range of motion.  Cardiovascular: Normal rate, regular rhythm, normal heart sounds and intact distal pulses.   Pulmonary/Chest: Effort normal and breath sounds normal. She has no wheezes.  Abdominal: Soft. Bowel sounds are normal.  There is tenderness in the suprapubic area. There is no rebound.  Mild suprapubic discomfort, no guarding.  Musculoskeletal: Normal range of motion.  Neurological: She is alert.  Skin: Skin is warm and dry.  Psychiatric: She has a normal mood and affect.  Nursing note and vitals reviewed.   ED Course  Procedures (including critical care time) Labs Review Labs Reviewed  URINALYSIS, ROUTINE W REFLEX MICROSCOPIC (NOT AT [facility]) - Abnormal; Notable for the following:     APPearance HAZY (*)    Specific Gravity, Urine >1.030 (*)    Hgb urine dipstick MODERATE (*)    Protein, ur 30 (*)    Leukocytes, UA MODERATE (*)    All other components within normal limits  URINE MICROSCOPIC-ADD ON - Abnormal; Notable for the following:    Squamous Epithelial / LPF MANY (*)    Bacteria, UA MANY (*)    All other components within normal limits  URINE CULTURE  POC URINE PREG, ED    Imaging Review No results found.   EKG Interpretation None      MDM   Final diagnoses:  UTI (lower urinary tract infection)    Patients labs and/or radiological studies were reviewed and considered during the medical decision making and disposition process.  Results were also discussed with patient. Urine cx ordered. Pt was placed on keflex, pyridium, encouraged to continue with increased fluid intake. Recheck for any fever, vomiting, worsen pain, flank pain.      Evalee Jefferson, PA-C 12/06/14 [place], MD 12/07/14 [phone]

## 2014-12-07 LAB — URINE CULTURE

## 2014-12-09 ENCOUNTER — Telehealth (HOSPITAL_COMMUNITY): Payer: Self-pay

## 2014-12-09 NOTE — Telephone Encounter (Signed)
Post ED Visit - Positive Culture Follow-up  Culture report reviewed by antimicrobial stewardship pharmacist: []  Wes Dulaney, Pharm.D., BCPS []  Heide Guile, Pharm.D., BCPS [x]  Alycia Rossetti, Pharm.D., BCPS []  [place], [place].D., BCPS, AAHIVP []  Legrand Como, Pharm.D., BCPS, AAHIVP []  Isac Sarna, Pharm.D., BCPS  Positive Urine culture, >/= 100,000 colonies -> E Coli Treated with Cephalexin, organism sensitive to the same and no further patient follow-up is required at this time.  Dortha Kern 12/09/2014, 6:25 AM

## 2015-05-07 ENCOUNTER — Encounter (HOSPITAL_COMMUNITY): Payer: Self-pay | Admitting: *Deleted

## 2015-05-07 ENCOUNTER — Emergency Department (HOSPITAL_COMMUNITY)
Admission: EM | Admit: 2015-05-07 | Discharge: 2015-05-07 | Disposition: A | Discharge status: Home / Self Care | Payer: Medicaid Other | Attending: Emergency Medicine | Admitting: Emergency Medicine

## 2015-05-07 DIAGNOSIS — Z792 Long term (current) use of antibiotics: Secondary | ICD-10-CM | POA: Insufficient documentation

## 2015-05-07 DIAGNOSIS — Z79899 Other long term (current) drug therapy: Secondary | ICD-10-CM | POA: Insufficient documentation

## 2015-05-07 DIAGNOSIS — Z8659 Personal history of other mental and behavioral disorders: Secondary | ICD-10-CM | POA: Insufficient documentation

## 2015-05-07 DIAGNOSIS — Z872 Personal history of diseases of the skin and subcutaneous tissue: Secondary | ICD-10-CM | POA: Insufficient documentation

## 2015-05-07 DIAGNOSIS — J039 Acute tonsillitis, unspecified: Secondary | ICD-10-CM

## 2015-05-07 DIAGNOSIS — Z8742 Personal history of other diseases of the female genital tract: Secondary | ICD-10-CM | POA: Insufficient documentation

## 2015-05-07 DIAGNOSIS — Z8739 Personal history of other diseases of the musculoskeletal system and connective tissue: Secondary | ICD-10-CM | POA: Insufficient documentation

## 2015-05-07 DIAGNOSIS — Z87891 Personal history of nicotine dependence: Secondary | ICD-10-CM | POA: Insufficient documentation

## 2015-05-07 LAB — RAPID STREP SCREEN (MED CTR MEBANE ONLY): STREPTOCOCCUS, GROUP A SCREEN (DIRECT): NEGATIVE

## 2015-05-07 MED ORDER — MAGIC MOUTHWASH W/LIDOCAINE: 5.0000 mL | Freq: Three times a day (TID) | ORAL | Status: DC | PRN

## 2015-05-07 MED ORDER — IBUPROFEN 800 MG PO TABS: 800.0000 mg | ORAL_TABLET | Freq: Three times a day (TID) | ORAL | Status: DC

## 2015-05-07 NOTE — ED Provider Notes (Signed)
CSN: [id number]     Arrival date & time 05/07/15  2101 History   First MD Initiated Contact with Patient 05/07/15 2110     Chief Complaint  Patient presents with  . Sore Throat     (Consider location/radiation/quality/duration/timing/severity/associated sxs/prior Treatment) HPI   Linda Warner is a 24 y.o. female who presents to the Emergency Department complaining of sore throat for one day.  She states that she noticed pain with swallowing and today has noticed white patches to the back of her throat and increasing pain.  She reports pain with swallowing.  She denies fever, neck pain, rash, cough, ear pain or congestion.  She has not tried any medications for sx relief.     Past Medical History  Diagnosis Date  . Acne   . Scoliosis   . Pregnant   . Scoliosis   . BV (bacterial vaginosis)   . 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 Miscarriage    Past Surgical History  Procedure Laterality Date  . No past surgeries     Family History  Problem Relation Age of Onset  . Depression Father   . Depression Maternal Grandmother   . Diabetes Maternal Grandmother   . Hyperlipidemia Maternal Grandmother   . Hypertension Maternal Grandmother   . Cancer Maternal Grandmother     breast  . Anesthesia problems Neg Hx    Social History  Substance Use Topics  . Smoking status: Former Smoker    Types: Cigarettes  . Smokeless tobacco: Never Used  . Alcohol Use: No   OB History    Gravida Para Term Preterm AB TAB SAB Ectopic Multiple Living   2 1 1  1  1   1      Review of Systems  Constitutional: Negative for fever, chills, activity change and appetite change.  HENT: Positive for sore throat. Negative for congestion, ear pain, facial swelling, trouble swallowing and voice change.   Eyes: Negative for pain and visual disturbance.  Respiratory: Negative for cough and shortness of breath.   Gastrointestinal: Negative for nausea, vomiting and abdominal pain.  Musculoskeletal:  Negative for arthralgias, neck pain and neck stiffness.  Skin: Negative for color change and rash.  Neurological: Negative for dizziness, facial asymmetry, speech difficulty, numbness and headaches.  Hematological: Negative for adenopathy.  All other systems reviewed and are negative.     Allergies  Review of patient's allergies indicates no known allergies.  Home Medications   Prior to Admission medications   Medication Sig Start Date End Date Taking? Authorizing Provider  cephALEXin (KEFLEX) 500 MG capsule Take 1 capsule (500 mg total) by mouth 4 (four) times daily. 12/04/14   Evalee Jefferson, PA-C  levonorgestrel (MIRENA) 20 MCG/24HR IUD 1 each by Intrauterine route once.    Historical Provider, MD  phenazopyridine (PYRIDIUM) 200 MG tablet Take 1 tablet (200 mg total) by mouth 3 (three) times daily. 12/04/14   Evalee Jefferson, PA-C   BP 129/87 mmHg  Pulse 117  Temp(Src) 98.5 F (36.9 C) (Oral)  Resp 21  Ht 5\' 2"  (1.575 m)  Wt 93.895 kg  BMI 37.85 kg/m2  SpO2 100% Physical Exam  Constitutional: She is oriented to person, place, and time. She appears well-developed and well-nourished. No distress.  HENT:  Head: Normocephalic and atraumatic.  Right Ear: Tympanic membrane and ear canal normal.  Left Ear: Tympanic membrane and ear canal normal.  Mouth/Throat: Uvula is midline and mucous membranes are normal. No trismus in the jaw. No uvula swelling.  Oropharyngeal exudate, posterior oropharyngeal edema and posterior oropharyngeal erythema present. No tonsillar abscesses.  Edema, erythema with exudates to bilateral tonsils.  Uvula is midline, non-edematous.    Neck: Normal range of motion. Neck supple.  Cardiovascular: Normal rate, regular rhythm and normal heart sounds.   Pulmonary/Chest: Effort normal and breath sounds normal.  Abdominal: There is no splenomegaly. There is no tenderness.  Musculoskeletal: Normal range of motion.  Lymphadenopathy:    She has no cervical adenopathy.   Neurological: She is alert and oriented to person, place, and time. She exhibits normal muscle tone. Coordination normal.  Skin: Skin is warm and dry.  Nursing note and vitals reviewed.   ED Course  Procedures (including critical care time) Labs Review Labs Reviewed  RAPID STREP SCREEN (NOT AT [facility])  CULTURE, GROUP A STREP    Imaging Review No results found. I have personally reviewed and evaluated these images and lab results as part of my medical decision-making.    MDM   Final diagnoses:  Tonsillitis with exudate    Pt is well appearing, airway patent.  No concerning sx's for PTA.  Mucous membranes are moist.  Sx's likely related to tonsillitis.  She agrees to symptomatic tx and close PMD f/u if needed.      Bufford Lope 05/08/15 [place], MD 05/08/15 [phone]

## 2015-05-07 NOTE — Discharge Instructions (Signed)
Tonsillitis Tonsillitis is an infection of the throat. This infection causes the tonsils to become red, tender, and puffy (swollen). Tonsils are groups of tissue at the back of your throat. If bacteria caused your infection, antibiotic medicine will be given to you. Sometimes symptoms of tonsillitis can be relieved with the use of steroid medicine. If your tonsillitis is severe and happens often, you may need to get your tonsils removed (tonsillectomy). HOME CARE   Rest and sleep often.  Drink enough fluids to keep your pee (urine) clear or pale yellow.  While your throat is sore, eat soft or liquid foods like:  Soup.  Ice cream.  Instant breakfast drinks.  Eat frozen ice pops.  Gargle with a warm or cold liquid to help soothe the throat. Gargle with a water and salt mix. Mix 1/4 teaspoon of salt and 1/4 teaspoon of baking soda in 1 cup of water.  Only take medicines as told by your doctor.  If you are given medicines (antibiotics), take them as told. Finish them even if you start to feel better. GET HELP IF:  You have large, tender lumps in your neck.  You have a rash.  You cough up green, yellow-brown, or bloody fluid.  You cannot swallow liquids or food for 24 hours.  You notice that only one of your tonsils is swollen. GET HELP RIGHT AWAY IF:   You throw up (vomit).  You have a very bad headache.  You have a stiff neck.  You have chest pain.  You have trouble breathing or swallowing.  You have bad throat pain, drooling, or your voice changes.  You have bad pain not helped by medicine.  You cannot fully open your mouth.  You have redness, puffiness, or bad pain in the neck.  You have a fever. MAKE SURE YOU:   Understand these instructions.  Will watch your condition.  Will get help right away if you are not doing well or get worse.   This information is not intended to replace advice given to you by your health care provider. Make sure you discuss any  questions you have with your health care provider.   Document Released: 10/21/2007 Document Revised: 05/09/2013 Document Reviewed: 10/21/2012 Elsevier Interactive Patient Education [employer].

## 2015-05-07 NOTE — ED Notes (Signed)
Pt reporting sore throat starting last night.

## 2015-05-10 LAB — CULTURE, GROUP A STREP: Strep A Culture: NEGATIVE

## 2015-07-08 ENCOUNTER — Emergency Department (HOSPITAL_COMMUNITY)
Admission: EM | Admit: 2015-07-08 | Discharge: 2015-07-08 | Disposition: A | Discharge status: Home / Self Care | Payer: Self-pay | Attending: Emergency Medicine | Admitting: Emergency Medicine

## 2015-07-08 ENCOUNTER — Encounter (HOSPITAL_COMMUNITY): Payer: Self-pay | Admitting: Emergency Medicine

## 2015-07-08 DIAGNOSIS — Z87891 Personal history of nicotine dependence: Secondary | ICD-10-CM | POA: Insufficient documentation

## 2015-07-08 DIAGNOSIS — Z8659 Personal history of other mental and behavioral disorders: Secondary | ICD-10-CM | POA: Insufficient documentation

## 2015-07-08 DIAGNOSIS — Z8742 Personal history of other diseases of the female genital tract: Secondary | ICD-10-CM | POA: Insufficient documentation

## 2015-07-08 DIAGNOSIS — Z8739 Personal history of other diseases of the musculoskeletal system and connective tissue: Secondary | ICD-10-CM | POA: Insufficient documentation

## 2015-07-08 DIAGNOSIS — Z872 Personal history of diseases of the skin and subcutaneous tissue: Secondary | ICD-10-CM | POA: Insufficient documentation

## 2015-07-08 DIAGNOSIS — R0981 Nasal congestion: Secondary | ICD-10-CM | POA: Insufficient documentation

## 2015-07-08 DIAGNOSIS — H65192 Other acute nonsuppurative otitis media, left ear: Secondary | ICD-10-CM | POA: Insufficient documentation

## 2015-07-08 MED ORDER — AMOXICILLIN 250 MG PO CAPS
500.0000 mg | ORAL_CAPSULE | Freq: Once | ORAL | Status: AC
  Administered 2015-07-08: 500 mg via ORAL
  Filled 2015-07-08: qty 2

## 2015-07-08 MED ORDER — AMOXICILLIN 500 MG PO CAPS: 500.0000 mg | ORAL_CAPSULE | Freq: Three times a day (TID) | ORAL | Status: DC

## 2015-07-08 NOTE — ED Notes (Signed)
Pt c/o left ear pain x3 days.  

## 2015-07-08 NOTE — Discharge Instructions (Signed)
Otitis Media, Adult °Otitis media is redness, soreness, and puffiness (swelling) in the space just behind your eardrum (middle ear). It may be caused by allergies or infection. It often happens along with a cold. °HOME CARE °· Take your medicine as told. Finish it even if you start to feel better. °· Only take over-the-counter or prescription medicines for pain, discomfort, or fever as told by your doctor. °· Follow up with your doctor as told. °GET HELP IF: °· You have otitis media only in one ear, or bleeding from your nose, or both. °· You notice a lump on your neck. °· You are not getting better in 3-5 days. °· You feel worse instead of better. °GET HELP RIGHT AWAY IF:  °· You have pain that is not helped with medicine. °· You have puffiness, redness, or pain around your ear. °· You get a stiff neck. °· You cannot move part of your face (paralysis). °· You notice that the bone behind your ear hurts when you touch it. °MAKE SURE YOU:  °· Understand these instructions. °· Will watch your condition. °· Will get help right away if you are not doing well or get worse. °  °This information is not intended to replace advice given to you by your health care provider. Make sure you discuss any questions you have with your health care provider. °  °Document Released: 10/21/2007 Document Revised: 05/25/2014 Document Reviewed: 11/29/2012 °Elsevier Interactive Patient Education ©2016 Elsevier Inc. ° ° °

## 2015-07-08 NOTE — ED Notes (Signed)
Pt states understanding of care given and follow up instructions.  Ambulated from ED  

## 2015-07-12 NOTE — ED Provider Notes (Signed)
CSN: [id number]     Arrival date & time 07/08/15  2051 History   First MD Initiated Contact with Patient 07/08/15 2203     Chief Complaint  Patient presents with  . Otalgia     (Consider location/radiation/quality/duration/timing/severity/associated sxs/prior Treatment) HPI   Linda Warner is a 25 y.o. female who presents to the Emergency Department complaining of left ear pain for 3 days.  She describes a sharp throbbing pain to her ear. Associated with some nasal congestion.  She has tried OTC ear drops without significant relief.  She denies cough, fever, chills, neck pain, and sore throat.     Past Medical History  Diagnosis Date  . Acne   . Scoliosis   . Pregnant   . Scoliosis   . BV (bacterial vaginosis)   . 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 Miscarriage    Past Surgical History  Procedure Laterality Date  . No past surgeries     Family History  Problem Relation Age of Onset  . Depression Father   . Depression Maternal Grandmother   . Diabetes Maternal Grandmother   . Hyperlipidemia Maternal Grandmother   . Hypertension Maternal Grandmother   . Cancer Maternal Grandmother     breast  . Anesthesia problems Neg Hx    Social History  Substance Use Topics  . Smoking status: Former Smoker    Types: Cigarettes  . Smokeless tobacco: Never Used  . Alcohol Use: No   OB History    Gravida Para Term Preterm AB TAB SAB Ectopic Multiple Living   2 1 1  1  1   1      Review of Systems  Constitutional: Negative for fever, chills, activity change and appetite change.  HENT: Positive for congestion and ear pain. Negative for facial swelling, rhinorrhea, sore throat and trouble swallowing.   Eyes: Negative for visual disturbance.  Respiratory: Negative for cough, shortness of breath, wheezing and stridor.   Gastrointestinal: Negative for nausea and vomiting.  Musculoskeletal: Negative for neck pain and neck stiffness.  Skin: Negative.   Neurological: Negative for  dizziness, weakness, numbness and headaches.  Hematological: Negative for adenopathy.  Psychiatric/Behavioral: Negative for confusion.  All other systems reviewed and are negative.     Allergies  Review of patient's allergies indicates no known allergies.  Home Medications   Prior to Admission medications   Medication Sig Start Date End Date Taking? Authorizing Provider  Homeopathic Products (EARACHE RELIEF OT) Place 1-2 drops in ear(s) daily as needed (for ear ache relief).   Yes Historical Provider, MD  ibuprofen (ADVIL,MOTRIN) 200 MG tablet Take 800 mg by mouth daily as needed for mild pain or moderate pain.   Yes Historical Provider, MD  levonorgestrel (MIRENA) 20 MCG/24HR IUD 1 each by Intrauterine route once.   Yes Historical Provider, MD  amoxicillin (AMOXIL) 500 MG capsule Take 1 capsule (500 mg total) by mouth 3 (three) times daily. For 7 days 07/08/15   Nickalas Mccarrick, PA-C   BP 126/77 mmHg  Pulse 94  Temp(Src) 99.1 F (37.3 C) (Oral)  Resp 18  Ht 5\' 3"  (1.6 m)  Wt 93.895 kg  BMI 36.68 kg/m2  SpO2 100% Physical Exam  Constitutional: She is oriented to person, place, and time. She appears well-developed and well-nourished. No distress.  HENT:  Head: Normocephalic and atraumatic.  Mouth/Throat: Uvula is midline, oropharynx is clear and moist and mucous membranes are normal. No uvula swelling. No oropharyngeal exudate.  Erythema of the left TM.  Mild middle ear effusion present.  No drainage or edema of the ear canal, TM appears intact.    Neck: Normal range of motion. Neck supple.  Cardiovascular: Normal rate, regular rhythm, normal heart sounds and intact distal pulses.   No murmur heard. Pulmonary/Chest: Effort normal and breath sounds normal. No stridor. No respiratory distress.  Musculoskeletal: Normal range of motion.  Lymphadenopathy:    She has no cervical adenopathy.  Neurological: She is alert and oriented to person, place, and time. Coordination normal.   Skin: Skin is warm and dry. No rash noted.  Nursing note and vitals reviewed.   ED Course  Procedures (including critical care time) Labs Review Labs Reviewed - No data to display  Imaging Review No results found. I have personally reviewed and evaluated these images and lab results as part of my medical decision-making.   EKG Interpretation None      MDM   Final diagnoses:  Acute nonsuppurative otitis media of left ear    Pt well appearing.  Vitals stable.  Left OM.  Pt agrees to tx plan and close PMD f/u if needed.    Kem Parkinson, PA-C 07/12/15 0102  Tanna Furry, MD 07/21/15 [phone]

## 2015-12-19 ENCOUNTER — Encounter (HOSPITAL_COMMUNITY): Payer: Self-pay | Admitting: Emergency Medicine

## 2015-12-19 ENCOUNTER — Emergency Department (HOSPITAL_COMMUNITY)
Admission: EM | Admit: 2015-12-19 | Discharge: 2015-12-19 | Disposition: A | Discharge status: Home / Self Care | Payer: Medicaid Other | Attending: Emergency Medicine | Admitting: Emergency Medicine

## 2015-12-19 DIAGNOSIS — Z791 Long term (current) use of non-steroidal anti-inflammatories (NSAID): Secondary | ICD-10-CM | POA: Insufficient documentation

## 2015-12-19 DIAGNOSIS — Z87891 Personal history of nicotine dependence: Secondary | ICD-10-CM | POA: Diagnosis not present

## 2015-12-19 DIAGNOSIS — A5901 Trichomonal vulvovaginitis: Secondary | ICD-10-CM | POA: Insufficient documentation

## 2015-12-19 DIAGNOSIS — N898 Other specified noninflammatory disorders of vagina: Secondary | ICD-10-CM | POA: Diagnosis present

## 2015-12-19 DIAGNOSIS — Z79899 Other long term (current) drug therapy: Secondary | ICD-10-CM | POA: Insufficient documentation

## 2015-12-19 LAB — WET PREP, GENITAL
Clue Cells Wet Prep HPF POC: NONE SEEN
Sperm: NONE SEEN
YEAST WET PREP: NONE SEEN

## 2015-12-19 MED ORDER — METRONIDAZOLE 500 MG PO TABS: 500.0000 mg | ORAL_TABLET | Freq: Two times a day (BID) | ORAL | 0 refills | Status: DC

## 2015-12-19 NOTE — ED Provider Notes (Signed)
[facility] DEPT Provider Note   CSN: [id number] Arrival date & time: 12/19/15  [id number]  First Provider Contact:  First MD Initiated Contact with Patient 12/19/15 1738        History   Chief Complaint Chief Complaint  Patient presents with  . Vaginal Itching    HPI Linda Warner is a 25 y.o. female who presents to the ED with vaginal itching and discharge that started a few days ago. She is sexually active with one female partner that she has been with for the past 4 years. She has no hx of STI's. She uses an IUD for birth control. She denies any other problems today.   The history is provided by the patient. No language interpreter was used.  Vaginal Itching  This is a new problem. The current episode started 2 days ago. The problem occurs constantly. The problem has not changed since onset.She has tried nothing for the symptoms.    Past Medical History:  Diagnosis Date  . Acne   . BV (bacterial vaginosis)   . Miscarriage   . 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 Pregnant   . Scoliosis   . Scoliosis     Patient Active Problem List   Diagnosis Date Noted  . Contraception management 02/27/2013  . 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 SCOLIOSIS 07/25/2008  . MOOD SWINGS 10/14/2006    Past Surgical History:  Procedure Laterality Date  . NO PAST SURGERIES      OB History    Gravida Para Term Preterm AB Living   2 1 1   1 1    SAB TAB Ectopic Multiple Live Births   1       1       Home Medications    Prior to Admission medications   Medication Sig Start Date End Date Taking? Authorizing Provider  ibuprofen (ADVIL,MOTRIN) 200 MG tablet Take 800 mg by mouth daily as needed for mild pain or moderate pain.    Historical Provider, MD  levonorgestrel (MIRENA) 20 MCG/24HR IUD 1 each by Intrauterine route once.    Historical Provider, MD  metroNIDAZOLE (FLAGYL) 500 MG tablet Take 1 tablet (500 mg total) by mouth 2 (two) times daily. 12/19/15   Hope Bunnie Pion, NP     Family History Family History  Problem Relation Age of Onset  . Depression Father   . Depression Maternal Grandmother   . Diabetes Maternal Grandmother   . Hyperlipidemia Maternal Grandmother   . Hypertension Maternal Grandmother   . Cancer Maternal Grandmother     breast  . Anesthesia problems Neg Hx     Social History Social History  Substance Use Topics  . Smoking status: Former Smoker    Types: Cigarettes  . Smokeless tobacco: Never Used  . Alcohol use No     Allergies   Review of patient's allergies indicates no known allergies.   Review of Systems Review of Systems Negative except as stated in HPI  Physical Exam Updated Vital Signs BP 131/80 (BP Location: Left Arm)   Pulse 110   Temp 98.4 F (36.9 C) (Oral)   Resp 16   Ht 5\' 3"  (1.6 m)   Wt 78 kg   LMP 12/01/2015 (Approximate)   SpO2 100%   BMI 30.47 kg/m   Physical Exam  Constitutional: She is oriented to person, place, and time. She appears well-developed and well-nourished. No distress.  HENT:  Head: Normocephalic.  Eyes: EOM are normal.  Neck: Neck supple.  Pulmonary/Chest: Effort normal.  Abdominal: Soft. There is no tenderness.  Genitourinary:  Genitourinary Comments: External genitalia without lesions, white d/c vaginal vault. No CMT, cervix inflamed, no adnexal tenderness, uterus without palpable enlargement   Musculoskeletal: Normal range of motion.  Neurological: She is alert and oriented to person, place, and time. No cranial nerve deficit.  Skin: Skin is warm and dry.  Psychiatric: She has a normal mood and affect. Her behavior is normal.  Nursing note and vitals reviewed.    ED Treatments / Results  Labs (all labs ordered are listed, but only abnormal results are displayed) Labs Reviewed  WET PREP, GENITAL - Abnormal; Notable for the following:       Result Value   Trich, Wet Prep PRESENT (*)    WBC, Wet Prep HPF POC MANY (*)    All other components within normal limits   GC/CHLAMYDIA PROBE AMP ([place]) NOT AT [facility]     Radiology No results found.  Procedures Procedures (including critical care time)  Medications Ordered in ED Medications - No data to display   Initial Impression / Assessment and Plan / ED Course  I have reviewed the triage vital signs and the nursing notes.  Pertinent labs & imaging results that were available during my care of the patient were reviewed by me and considered in my medical decision making (see chart for details).  Clinical Course   Discussed with the patient clinical and lab findings and all questioned fully answered. She f/u with the health department for further STI testing and her partner as well.    Final Clinical Impressions(s) / ED Diagnoses  25 y.o. female with vaginal itching stable for d/c without abdominal pain and does not appear toxic. Will treat for trichomonas.   Final diagnoses:  Trichomonas vaginitis    New Prescriptions New Prescriptions   METRONIDAZOLE (FLAGYL) 500 MG TABLET    Take 1 tablet (500 mg total) by mouth 2 (two) times daily.     [address] [place], NP 12/19/15 1851    Milton Ferguson, MD 12/19/15 2233

## 2015-12-19 NOTE — Discharge Instructions (Signed)
We are treating you for the trichomonas infection. Your partner will need to go to the health department and get treated as well. Men often do not have symptoms but will re infect you if they are not treated. You can also go to the health department for further testing for HIV, syphilis and Hepatitis.

## 2015-12-19 NOTE — ED Triage Notes (Signed)
Pt c/o of vaginal itching, change in discharge, and burning with urination. Pt does report having unprotected intercourse. Pt denies any abd/back pain.

## 2015-12-20 LAB — GC/CHLAMYDIA PROBE AMP (~~LOC~~) NOT AT ARMC
Chlamydia: NEGATIVE
NEISSERIA GONORRHEA: NEGATIVE

## 2016-02-03 ENCOUNTER — Emergency Department (HOSPITAL_COMMUNITY)
Admission: EM | Admit: 2016-02-03 | Discharge: 2016-02-03 | Disposition: A | Discharge status: Home / Self Care | Payer: Medicaid Other | Attending: Emergency Medicine | Admitting: Emergency Medicine

## 2016-02-03 ENCOUNTER — Emergency Department (HOSPITAL_COMMUNITY): Payer: Medicaid Other

## 2016-02-03 ENCOUNTER — Encounter (HOSPITAL_COMMUNITY): Payer: Self-pay | Admitting: Emergency Medicine

## 2016-02-03 DIAGNOSIS — Z79899 Other long term (current) drug therapy: Secondary | ICD-10-CM | POA: Diagnosis not present

## 2016-02-03 DIAGNOSIS — J029 Acute pharyngitis, unspecified: Secondary | ICD-10-CM | POA: Diagnosis present

## 2016-02-03 DIAGNOSIS — Z791 Long term (current) use of non-steroidal anti-inflammatories (NSAID): Secondary | ICD-10-CM | POA: Insufficient documentation

## 2016-02-03 DIAGNOSIS — J069 Acute upper respiratory infection, unspecified: Secondary | ICD-10-CM | POA: Diagnosis not present

## 2016-02-03 DIAGNOSIS — Z87891 Personal history of nicotine dependence: Secondary | ICD-10-CM | POA: Insufficient documentation

## 2016-02-03 DIAGNOSIS — B9789 Other viral agents as the cause of diseases classified elsewhere: Secondary | ICD-10-CM

## 2016-02-03 LAB — POC URINE PREG, ED: Preg Test, Ur: NEGATIVE

## 2016-02-03 MED ORDER — MAGIC MOUTHWASH W/LIDOCAINE: 5.0000 mL | Freq: Three times a day (TID) | ORAL | 0 refills | Status: DC | PRN

## 2016-02-03 MED ORDER — ONDANSETRON 4 MG PO TBDP
4.0000 mg | ORAL_TABLET | Freq: Once | ORAL | Status: AC
  Administered 2016-02-03: 4 mg via ORAL
  Filled 2016-02-03: qty 1

## 2016-02-03 MED ORDER — GUAIFENESIN-CODEINE 100-10 MG/5ML PO SYRP: 10.0000 mL | ORAL_SOLUTION | Freq: Three times a day (TID) | ORAL | 0 refills | Status: DC | PRN

## 2016-02-03 NOTE — Discharge Instructions (Signed)
Alternate tylenol and ibuprofen every 4-6 hrs for fever.  Drink plenty of fluids.  Follow-up with your primary doctor or return here for any worsening symptoms

## 2016-02-03 NOTE — ED Provider Notes (Signed)
[facility] DEPT Provider Note   CSN: [id number] Arrival date & time: 02/03/16  1553  By signing my name below, I, Soijett Blue, attest that this documentation has been prepared under the direction and in the presence of Kem Parkinson, PA-C Electronically Signed: Pena, ED Scribe. 02/03/16. 4:27 PM.   History   Chief Complaint Chief Complaint  Patient presents with  . Sore Throat    HPI  Linda Warner is a 25 y.o. female who presents to the Emergency Department complaining of sore throat onset 2 days. She notes that she recently started a new job and some of her coworkers are sick. She states that she is having associated symptoms of cough x today and post-tussive vomiting. She states that she has tried cough drops and OTC mucinex with no relief for her symptoms. She denies fever, chills, nasal congestion, rhinorrhea, nausea, and any other symptoms. Denies allergies to medications. Denies PMHx of asthma.    The history is provided by the patient. No language interpreter was used.  Sore Throat  This is a new problem. The current episode started 2 days ago. The problem occurs constantly. The problem has not changed since onset.The symptoms are aggravated by swallowing. Nothing relieves the symptoms. She has tried nothing for the symptoms. The treatment provided no relief.    Past Medical History:  Diagnosis Date  . Acne   . BV (bacterial vaginosis)   . Miscarriage   . 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 Pregnant   . Scoliosis   . Scoliosis     Patient Active Problem List   Diagnosis Date Noted  . Contraception management 02/27/2013  . 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 SCOLIOSIS 07/25/2008  . MOOD SWINGS 10/14/2006    Past Surgical History:  Procedure Laterality Date  . NO PAST SURGERIES      OB History    Gravida Para Term Preterm AB Living   2 1 1   1 1    SAB TAB Ectopic Multiple Live Births   1       1       Home Medications    Prior to  Admission medications   Medication Sig Start Date End Date Taking? Authorizing Provider  guaiFENesin-codeine (ROBITUSSIN AC) 100-10 MG/5ML syrup Take 10 mLs by mouth 3 (three) times daily as needed. 02/03/16   Tammy Triplett, PA-C  ibuprofen (ADVIL,MOTRIN) 200 MG tablet Take 800 mg by mouth daily as needed for mild pain or moderate pain.    Historical Provider, MD  levonorgestrel (MIRENA) 20 MCG/24HR IUD 1 each by Intrauterine route once.    Historical Provider, MD  magic mouthwash w/lidocaine SOLN Take 5 mLs by mouth 3 (three) times daily as needed for mouth pain. Swish and spit, do not swallow 02/03/16   Tammy Triplett, PA-C  metroNIDAZOLE (FLAGYL) 500 MG tablet Take 1 tablet (500 mg total) by mouth 2 (two) times daily. 12/19/15   Hope Bunnie Pion, NP    Family History Family History  Problem Relation Age of Onset  . Depression Father   . Depression Maternal Grandmother   . Diabetes Maternal Grandmother   . Hyperlipidemia Maternal Grandmother   . Hypertension Maternal Grandmother   . Cancer Maternal Grandmother     breast  . Anesthesia problems Neg Hx     Social History Social History  Substance Use Topics  . Smoking status: Former Smoker    Types: Cigarettes  . Smokeless tobacco: Never Used  . Alcohol use No  Allergies   Review of patient's allergies indicates no known allergies.   Review of Systems Review of Systems  Constitutional: Negative for chills and fever.  HENT: Positive for sore throat. Negative for congestion and rhinorrhea.   Respiratory: Positive for cough.   Gastrointestinal: Positive for vomiting (post-tussive). Negative for nausea.     Physical Exam Updated Vital Signs BP 125/82 (BP Location: Right Arm)   Pulse 108   Temp 98.3 F (36.8 C) (Oral)   Resp 18   Ht 5\' 3"  (1.6 m)   Wt 217 lb (98.4 kg)   SpO2 98%   BMI 38.44 kg/m   Physical Exam  Constitutional: She is oriented to person, place, and time. She appears well-developed and well-nourished.  No distress.  HENT:  Head: Normocephalic and atraumatic.  Mouth/Throat: Uvula is midline and mucous membranes are normal. No uvula swelling. Oropharyngeal exudate and posterior oropharyngeal erythema present. No posterior oropharyngeal edema.  Eyes: EOM are normal.  Neck: Neck supple.  Cardiovascular: Normal rate, regular rhythm and normal heart sounds.  Exam reveals no gallop and no friction rub.   No murmur heard. Pulmonary/Chest: Effort normal and breath sounds normal. No respiratory distress. She has no wheezes. She has no rales.  Abdominal: Soft. She exhibits no distension. There is no tenderness.  Musculoskeletal: Normal range of motion.  Lymphadenopathy:    She has cervical adenopathy.  Neurological: She is alert and oriented to person, place, and time.  Skin: Skin is warm and dry.  Psychiatric: She has a normal mood and affect. Her behavior is normal.  Nursing note and vitals reviewed.    ED Treatments / Results  DIAGNOSTIC STUDIES: Oxygen Saturation is 98% on RA, nl by my interpretation.    COORDINATION OF CARE: 4:24 PM Discussed treatment plan with pt at bedside which includes UA, CXR, and pt agreed to plan.    Labs (all labs ordered are listed, but only abnormal results are displayed) Labs Reviewed  POC URINE PREG, ED    Radiology No results found.  Procedures Procedures (including critical care time)  Medications Ordered in ED Medications  ondansetron (ZOFRAN-ODT) disintegrating tablet 4 mg (4 mg Oral Given 02/03/16 1613)     Initial Impression / Assessment and Plan / ED Course  I have reviewed the triage vital signs and the nursing notes.  Pertinent labs & imaging results that were available during my care of the patient were reviewed by me and considered in my medical decision making (see chart for details).  Clinical Course      Final Clinical Impressions(s) / ED Diagnoses   Final diagnoses:  Viral URI with cough    New  Prescriptions Discharge Medication List as of 02/03/2016  4:31 PM    START taking these medications   Details  guaiFENesin-codeine (ROBITUSSIN AC) 100-10 MG/5ML syrup Take 10 mLs by mouth 3 (three) times daily as needed., Starting Mon 02/03/2016, Print    magic mouthwash w/lidocaine SOLN Take 5 mLs by mouth 3 (three) times daily as needed for mouth pain. Swish and spit, do not swallow, Starting Mon 02/03/2016, Print        I personally performed the services described in this documentation, which was scribed in my presence. The recorded information has been reviewed and is accurate.      Nat Christen, MD 02/07/16 [phone]

## 2016-02-03 NOTE — ED Triage Notes (Signed)
Pt c/o cough with emesis, sore throat and headache x 2 days.

## 2016-10-22 ENCOUNTER — Emergency Department (HOSPITAL_COMMUNITY)
Admission: EM | Admit: 2016-10-22 | Discharge: 2016-10-22 | Disposition: A | Discharge status: Home Health | Payer: Medicaid Other | Attending: Emergency Medicine | Admitting: Emergency Medicine

## 2016-10-22 ENCOUNTER — Encounter (HOSPITAL_COMMUNITY): Payer: Self-pay | Admitting: Emergency Medicine

## 2016-10-22 DIAGNOSIS — Z87891 Personal history of nicotine dependence: Secondary | ICD-10-CM | POA: Diagnosis not present

## 2016-10-22 DIAGNOSIS — B9789 Other viral agents as the cause of diseases classified elsewhere: Secondary | ICD-10-CM

## 2016-10-22 DIAGNOSIS — J069 Acute upper respiratory infection, unspecified: Secondary | ICD-10-CM | POA: Diagnosis not present

## 2016-10-22 DIAGNOSIS — R05 Cough: Secondary | ICD-10-CM | POA: Diagnosis present

## 2016-10-22 MED ORDER — BENZONATATE 200 MG PO CAPS: 200.0000 mg | ORAL_CAPSULE | Freq: Three times a day (TID) | ORAL | 0 refills | Status: DC | PRN

## 2016-10-22 MED ORDER — ALBUTEROL SULFATE HFA 108 (90 BASE) MCG/ACT IN AERS
2.0000 | INHALATION_SPRAY | Freq: Once | RESPIRATORY_TRACT | Status: AC
  Administered 2016-10-22: 2 via RESPIRATORY_TRACT
  Filled 2016-10-22: qty 6.7

## 2016-10-22 NOTE — ED Triage Notes (Signed)
Patient complaining of cough and headache this morning. Also complaining of sore throat x 3 days.

## 2016-10-22 NOTE — ED Provider Notes (Signed)
[facility] DEPT Provider Note   CSN: [id number] Arrival date & time: 10/22/16  1027     History   Chief Complaint Chief Complaint  Patient presents with  . Cough    HPI Linda Warner is a 26 y.o. female.  HPI   Linda Warner is a 26 y.o. female who presents to the Emergency Department complaining of Sore throat, nasal congestion, and cough. She states she developed nasal congestion and sore throat 2 days ago, cough developed this morning. Cough has been occasionally productive. She also complains of frontal headache that has been gradual in onset and described as throbbing sensation across her for head and into her temples. Headache has been associated with excessive cough. She denies fever, body aches, rash, chest pain or tightness, and shortness of breath.  Past Medical History:  Diagnosis Date  . Acne   . BV (bacterial vaginosis)   . Miscarriage   . 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 Pregnant   . Scoliosis   . Scoliosis     Patient Active Problem List   Diagnosis Date Noted  . Contraception management 02/27/2013  . 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 SCOLIOSIS 07/25/2008  . MOOD SWINGS 10/14/2006    Past Surgical History:  Procedure Laterality Date  . NO PAST SURGERIES      OB History    Gravida Para Term Preterm AB Living   2 1 1   1 1    SAB TAB Ectopic Multiple Live Births   1       1       Home Medications    Prior to Admission medications   Medication Sig Start Date End Date Taking? Authorizing Provider  guaiFENesin-codeine (ROBITUSSIN AC) 100-10 MG/5ML syrup Take 10 mLs by mouth 3 (three) times daily as needed. 02/03/16   Ovetta Bazzano, PA-C  ibuprofen (ADVIL,MOTRIN) 200 MG tablet Take 800 mg by mouth daily as needed for mild pain or moderate pain.    [provider]  levonorgestrel (MIRENA) 20 MCG/24HR IUD 1 each by Intrauterine route once.    [provider]  magic mouthwash w/lidocaine SOLN Take 5 mLs by mouth 3  (three) times daily as needed for mouth pain. Swish and spit, do not swallow 02/03/16   Miabella Shannahan, PA-C  metroNIDAZOLE (FLAGYL) 500 MG tablet Take 1 tablet (500 mg total) by mouth 2 (two) times daily. 12/19/15   Ashley Murrain, NP    Family History Family History  Problem Relation Age of Onset  . Depression Father   . Depression Maternal Grandmother   . Diabetes Maternal Grandmother   . Hyperlipidemia Maternal Grandmother   . Hypertension Maternal Grandmother   . Cancer Maternal Grandmother        breast  . Anesthesia problems Neg Hx     Social History Social History  Substance Use Topics  . Smoking status: Former Smoker    Types: Cigarettes  . Smokeless tobacco: Never Used  . Alcohol use Yes     Comment: occasionally     Allergies   Patient has no known allergies.   Review of Systems Review of Systems  Constitutional: Negative for appetite change, chills and fever.  HENT: Positive for congestion, rhinorrhea and sore throat. Negative for ear pain and trouble swallowing.   Respiratory: Positive for cough. Negative for chest tightness, shortness of breath and wheezing.   Cardiovascular: Negative for chest pain.  Gastrointestinal: Negative for abdominal pain, nausea and vomiting.  Genitourinary: Negative for dysuria.  Musculoskeletal: Negative for arthralgias, myalgias, neck pain and neck stiffness.  Skin: Negative for rash.  Neurological: Positive for headaches. Negative for dizziness, weakness and numbness.  Hematological: Negative for adenopathy.  All other systems reviewed and are negative.    Physical Exam Updated Vital Signs BP 122/85 (BP Location: Right Arm)   Pulse 100   Temp 98 F (36.7 C) (Oral)   Resp 20   Ht 5\' 3"  (1.6 m)   Wt 98.9 kg (218 lb)   SpO2 100%   BMI 38.62 kg/m   Physical Exam  Constitutional: She is oriented to person, place, and time. She appears well-developed and well-nourished. No distress.  HENT:  Head: Normocephalic and  atraumatic.  Right Ear: Tympanic membrane and ear canal normal.  Left Ear: Tympanic membrane and ear canal normal.  Nose: Rhinorrhea present. No mucosal edema. Right sinus exhibits no maxillary sinus tenderness and no frontal sinus tenderness. Left sinus exhibits no maxillary sinus tenderness and no frontal sinus tenderness.  Mouth/Throat: Uvula is midline and mucous membranes are normal. No trismus in the jaw. No uvula swelling. No oropharyngeal exudate, posterior oropharyngeal edema, posterior oropharyngeal erythema or tonsillar abscesses.  Eyes: Conjunctivae are normal.  Neck: Normal range of motion and phonation normal. Neck supple. No Kernig's sign noted.  Cardiovascular: Normal rate, regular rhythm, normal heart sounds and intact distal pulses.   No murmur heard. Pulmonary/Chest: Effort normal and breath sounds normal. No respiratory distress. She has no wheezes. She has no rales.  Abdominal: Soft. She exhibits no distension. There is no tenderness. There is no rebound and no guarding.  Musculoskeletal: She exhibits no edema.  Lymphadenopathy:    She has no cervical adenopathy.  Neurological: She is alert and oriented to person, place, and time. No sensory deficit. She exhibits normal muscle tone. Coordination normal.  Skin: Skin is warm and dry. Capillary refill takes less than 2 seconds.  Nursing note and vitals reviewed.    ED Treatments / Results  Labs (all labs ordered are listed, but only abnormal results are displayed) Labs Reviewed - No data to display  EKG  EKG Interpretation None       Radiology No results found.  Procedures Procedures (including critical care time)  Medications Ordered in ED Medications - No data to display   Initial Impression / Assessment and Plan / ED Course  I have reviewed the triage vital signs and the nursing notes.  Pertinent labs & imaging results that were available during my care of the patient were reviewed by me and  considered in my medical decision making (see chart for details).     Patient well appearing. Vital signs are stable. Note hypoxia or tachypnea. Symptoms are likely viral. Patient agrees to symptomatic treatment and PCP follow-up if needed.  Final Clinical Impressions(s) / ED Diagnoses   Final diagnoses:  Viral URI with cough    New Prescriptions New Prescriptions   No medications on file     Bufford Lope 10/22/16 1221    Noemi Chapel, MD 10/22/16 [phone]

## 2016-10-22 NOTE — Discharge Instructions (Signed)
Tylenol or ibuprofen if needed, 1-2 puffs of the albuterol inhaler every 4-6 hours as needed. Drink plenty of fluids. Follow-up with your doctor or return to the ER for any worsening symptoms

## 2017-01-24 ENCOUNTER — Encounter (HOSPITAL_COMMUNITY): Payer: Self-pay | Admitting: Emergency Medicine

## 2017-01-24 ENCOUNTER — Emergency Department (HOSPITAL_COMMUNITY)
Admission: EM | Admit: 2017-01-24 | Discharge: 2017-01-24 | Disposition: A | Discharge status: Home / Self Care | Payer: Self-pay | Attending: Emergency Medicine | Admitting: Emergency Medicine

## 2017-01-24 DIAGNOSIS — F17210 Nicotine dependence, cigarettes, uncomplicated: Secondary | ICD-10-CM | POA: Insufficient documentation

## 2017-01-24 DIAGNOSIS — R221 Localized swelling, mass and lump, neck: Secondary | ICD-10-CM | POA: Insufficient documentation

## 2017-01-24 DIAGNOSIS — Z79899 Other long term (current) drug therapy: Secondary | ICD-10-CM | POA: Insufficient documentation

## 2017-01-24 NOTE — ED Provider Notes (Signed)
[facility] DEPT Provider Note   CSN: [id number] Arrival date & time: 01/24/17  1157     History   Chief Complaint Chief Complaint  Patient presents with  . Oral Swelling    HPI Linda Warner is a 26 y.o. female.  HPI  The pt is a 26 y/o female She presents with a feeling of swelling of the throat on the left side. The swelling is been persistent, it does not interfere with swallowing breathing or speaking and she has no changes in her voice however she is concerned about throat cancer since her great uncle had that. She does smoke intermittently. She has no pain, no fever, no pus on her tonsils and denies any shortness of breath coughing abdominal pain or any other symptoms.  Past Medical History:  Diagnosis Date  . Acne   . BV (bacterial vaginosis)   . Miscarriage   . 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 Pregnant   . Scoliosis   . Scoliosis     Patient Active Problem List   Diagnosis Date Noted  . Contraception management 02/27/2013  . 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 SCOLIOSIS 07/25/2008  . MOOD SWINGS 10/14/2006    Past Surgical History:  Procedure Laterality Date  . NO PAST SURGERIES      OB History    Gravida Para Term Preterm AB Living   2 1 1   1 1    SAB TAB Ectopic Multiple Live Births   1       1       Home Medications    Prior to Admission medications   Medication Sig Start Date End Date Taking? Authorizing Provider  benzonatate (TESSALON) 200 MG capsule Take 1 capsule (200 mg total) by mouth 3 (three) times daily as needed for cough. Swallow whole do not chew 10/22/16   Triplett, Tammy, PA-C  guaiFENesin-codeine (ROBITUSSIN AC) 100-10 MG/5ML syrup Take 10 mLs by mouth 3 (three) times daily as needed. 02/03/16   Triplett, Tammy, PA-C  ibuprofen (ADVIL,MOTRIN) 200 MG tablet Take 800 mg by mouth daily as needed for mild pain or moderate pain.    [provider]  levonorgestrel (MIRENA) 20 MCG/24HR IUD 1 each by Intrauterine route  once.    [provider]  magic mouthwash w/lidocaine SOLN Take 5 mLs by mouth 3 (three) times daily as needed for mouth pain. Swish and spit, do not swallow 02/03/16   Triplett, Tammy, PA-C  metroNIDAZOLE (FLAGYL) 500 MG tablet Take 1 tablet (500 mg total) by mouth 2 (two) times daily. 12/19/15   Ashley Murrain, NP    Family History Family History  Problem Relation Age of Onset  . Depression Father   . Depression Maternal Grandmother   . Diabetes Maternal Grandmother   . Hyperlipidemia Maternal Grandmother   . Hypertension Maternal Grandmother   . Cancer Maternal Grandmother        breast  . Anesthesia problems Neg Hx     Social History Social History  Substance Use Topics  . Smoking status: Current Some Day Smoker    Types: Cigarettes  . Smokeless tobacco: Never Used  . Alcohol use Yes     Comment: occasionally     Allergies   Patient has no known allergies.   Review of Systems Review of Systems  Constitutional: Negative for fever.  HENT: Negative for congestion, facial swelling, trouble swallowing and voice change.   Respiratory: Negative for cough and shortness of breath.   Cardiovascular: Negative  for chest pain.  Hematological: Negative for adenopathy.     Physical Exam Updated Vital Signs BP 138/90 (BP Location: Left Arm)   Pulse (!) 103   Temp 98.9 F (37.2 C) (Oral)   Resp 16   Ht 5\' 3"  (1.6 m)   Wt 95.3 kg (210 lb)   LMP 12/24/2016   SpO2 100%   BMI 37.20 kg/m   Physical Exam  Constitutional: She appears well-developed and well-nourished. No distress.  HENT:  Head: Normocephalic and atraumatic.  Right Ear: External ear normal.  Left Ear: External ear normal.  Nose: Nose normal.  Mouth/Throat: Oropharynx is clear and moist. No oropharyngeal exudate.  Eyes: Pupils are equal, round, and reactive to light. Conjunctivae and EOM are normal. Right eye exhibits no discharge. Left eye exhibits no discharge. No scleral icterus.  Neck: Normal  range of motion. Neck supple.  Cardiovascular: Normal rate and regular rhythm.   Pulmonary/Chest: Effort normal and breath sounds normal.  Lymphadenopathy:    She has cervical adenopathy ( isolated sub centimeter LN on the L side - anterior cervical chain - non tender).  Skin: Skin is warm and dry. No rash noted. No erythema.     ED Treatments / Results  Labs (all labs ordered are listed, but only abnormal results are displayed) Labs Reviewed - No data to display   Radiology No results found.  Procedures Procedures (including critical care time)  Medications Ordered in ED Medications - No data to display   Initial Impression / Assessment and Plan / ED Course  I have reviewed the triage vital signs and the nursing notes.  Pertinent labs & imaging results that were available during my care of the patient were reviewed by me and considered in my medical decision making (see chart for details).     Hough the patient does not have any sore throat she does have a feeling of fullness, there is no abnormal findings on inspection of her throat, her tongue appears normal, phonation is normal, tonsillar pillars are normal, uvula is midline and there is no visible swelling. She does have an isolated nontender lymph node on the left which I have made her aware of. I have also given her the phone number for ENT to follow-up in a routine fashion within the next 2 weeks if not getting any better but to return to the ER if worsens. I have also recommended that the patient stop smoking immediately as this would put her at increased risk for head and neck cancer.  Final Clinical Impressions(s) / ED Diagnoses   Final diagnoses:  Throat swelling    New Prescriptions New Prescriptions   No medications on file     Noemi Chapel, MD 01/24/17 1316

## 2017-01-24 NOTE — ED Notes (Signed)
Pt is able to speak in full sentences, denies SOB or any other respiratory symptoms. Able to maintain airway and appears in no distress.

## 2017-01-24 NOTE — ED Triage Notes (Addendum)
Patient c/o difficult x1 week. Patient denies any pain in throat. Per patient "feels like left side of throat is swollen."  Denies any coughing, fevers, or ear pain. Denies any difficulty breathing or swelling of tongue. Patient able to handle oral secretions. Patient anxious and states "I would like to be checked for throat cancer because that runs in my family."

## 2017-01-24 NOTE — Discharge Instructions (Signed)
Motrin 3 times daily ER for worsening symptoms.

## 2017-05-06 ENCOUNTER — Encounter (HOSPITAL_COMMUNITY): Payer: Self-pay

## 2017-05-06 ENCOUNTER — Emergency Department (HOSPITAL_COMMUNITY)
Admission: EM | Admit: 2017-05-06 | Discharge: 2017-05-07 | Disposition: A | Discharge status: Home / Self Care | Payer: Self-pay | Attending: Emergency Medicine | Admitting: Emergency Medicine

## 2017-05-06 ENCOUNTER — Other Ambulatory Visit: Payer: Self-pay

## 2017-05-06 DIAGNOSIS — Z975 Presence of (intrauterine) contraceptive device: Secondary | ICD-10-CM | POA: Insufficient documentation

## 2017-05-06 DIAGNOSIS — F17210 Nicotine dependence, cigarettes, uncomplicated: Secondary | ICD-10-CM | POA: Insufficient documentation

## 2017-05-06 DIAGNOSIS — R21 Rash and other nonspecific skin eruption: Secondary | ICD-10-CM | POA: Insufficient documentation

## 2017-05-06 MED ORDER — HYDROXYZINE HCL 25 MG PO TABS
25.0000 mg | ORAL_TABLET | Freq: Once | ORAL | Status: AC
  Administered 2017-05-06: 25 mg via ORAL
  Filled 2017-05-06: qty 1

## 2017-05-06 MED ORDER — PREDNISONE 20 MG PO TABS
40.0000 mg | ORAL_TABLET | Freq: Once | ORAL | Status: AC
Start: 2017-05-06 — End: 2017-05-06
  Administered 2017-05-06: 40 mg via ORAL
  Filled 2017-05-06: qty 2

## 2017-05-06 MED ORDER — ONDANSETRON HCL 4 MG PO TABS
4.0000 mg | ORAL_TABLET | Freq: Once | ORAL | Status: AC
Start: 2017-05-06 — End: 2017-05-06
  Administered 2017-05-06: 4 mg via ORAL
  Filled 2017-05-06: qty 1

## 2017-05-06 NOTE — ED Triage Notes (Signed)
Pt reports rash that started 2 days ago to arms that resolved, then today noticed rash on bilateral LE and lower back. Small slightly raised red bumps that itch. Pt reports several co-workers have the same rash. 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

## 2017-05-06 NOTE — ED Provider Notes (Signed)
[facility] EMERGENCY DEPARTMENT Provider Note   CSN: [id number] Arrival date & time: 05/06/17  2219     History   Chief Complaint Chief Complaint  Patient presents with  . Rash    HPI Linda Warner is a 26 y.o. female.  Patient is a 26 year old female who presents to the emergency department with a rash on her arm, back, and right lower extremity.  The patient states that about 4 days ago while taking a bath she noticed a rash on her right shoulder and upper arm.  This went away on its own.  But because a great deal of itching.  2 days ago the patient noted a rash on her right lower back.  And yesterday and today the patient noted rash on her right lower leg.  No other rash noted.  No fever or chills reported.  No other problems reported.  Patient denies any difficulty with breathing or swallowing.  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      Past Medical History:  Diagnosis Date  . Acne   . BV (bacterial vaginosis)   . Miscarriage   . 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 Pregnant   . Scoliosis   . Scoliosis     Patient Active Problem List   Diagnosis Date Noted  . Contraception management 02/27/2013  . 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 SCOLIOSIS 07/25/2008  . MOOD SWINGS 10/14/2006    Past Surgical History:  Procedure Laterality Date  . NO PAST SURGERIES      OB History    Gravida Para Term Preterm AB Living   2 1 1   1 1    SAB TAB Ectopic Multiple Live Births   1       1       Home Medications    Prior to Admission medications   Medication Sig Start Date End Date Taking? Authorizing Provider  ibuprofen (ADVIL,MOTRIN) 200 MG tablet Take 800 mg by mouth daily as needed for mild pain or moderate pain.   Yes [provider]  levonorgestrel (MIRENA) 20 MCG/24HR IUD 1 each by Intrauterine route once.   Yes [provider]    Family History Family History  Problem Relation Age of Onset    . Depression Father   . Depression Maternal Grandmother   . Diabetes Maternal Grandmother   . Hyperlipidemia Maternal Grandmother   . Hypertension Maternal Grandmother   . Cancer Maternal Grandmother        breast  . Anesthesia problems Neg Hx     Social History Social History   Tobacco Use  . Smoking status: Current Some Day Smoker    Types: Cigarettes  . Smokeless tobacco: Never Used  Substance Use Topics  . Alcohol use: Yes    Comment: occasionally  . Drug use: No     Allergies   Patient has no known allergies.   Review of Systems Review of Systems  Constitutional: Negative for activity change.       All ROS Neg except as noted in HPI  HENT: Negative for nosebleeds.   Eyes: Negative for photophobia and discharge.  Respiratory: Negative for cough, shortness of breath and wheezing.   Cardiovascular: Negative for chest pain and palpitations.  Gastrointestinal: Negative for abdominal pain and blood in stool.  Genitourinary: Negative for dysuria, frequency and hematuria.  Musculoskeletal: Negative for arthralgias, back pain and neck pain.  Skin: Positive for rash.  Neurological:  Negative for dizziness, seizures and speech difficulty.  Psychiatric/Behavioral: Negative for confusion and hallucinations.     Physical Exam Updated Vital Signs BP (!) 144/88   Pulse (!) 103   Temp 98.6 F (37 C) (Oral)   Resp 18   Ht 5\' 3"  (1.6 m)   Wt 99.3 kg (219 lb)   SpO2 100%   BMI 38.79 kg/m   Physical Exam  Constitutional: She is oriented to person, place, and time. She appears well-developed and well-nourished.  Non-toxic appearance.  HENT:  Head: Normocephalic.  Right Ear: Tympanic membrane and external ear normal.  Left Ear: Tympanic membrane and external ear normal.  No facial swelling noted.  No swelling of the tongue.  The airway is patent.  Speech is clear and understandable.  Eyes: EOM and lids are normal. Pupils are equal, round, and reactive to light.  Neck:  Normal range of motion. Neck supple. Carotid bruit is not present.  Cardiovascular: Normal rate, regular rhythm, normal heart sounds, intact distal pulses and normal pulses.  Pulmonary/Chest: Breath sounds normal. No respiratory distress.  There is symmetrical rise and fall of the chest.  The patient speaks in complete sentences without any problem whatsoever.  Abdominal: Soft. Bowel sounds are normal. There is no tenderness. There is no guarding.  Musculoskeletal: Normal range of motion.  Lymphadenopathy:       Head (right side): No submandibular adenopathy present.       Head (left side): No submandibular adenopathy present.    She has no cervical adenopathy.  Neurological: She is alert and oriented to person, place, and time. She has normal strength. No cranial nerve deficit or sensory deficit.  Skin: Skin is warm and dry.  Red slightly raised blotches noted of the right lower back.  Similar splotches noted on the right lower leg.  No red streaks appreciated.  No drainage.  No blisters.  And no evidence of hives.  Psychiatric: She has a normal mood and affect. Her speech is normal.  Nursing note and vitals reviewed.    ED Treatments / Results  Labs (all labs ordered are listed, but only abnormal results are displayed) Labs Reviewed - No data to display  EKG  EKG Interpretation None       Radiology No results found.  Procedures Procedures (including critical care time)  Medications Ordered in ED Medications - No data to display   Initial Impression / Assessment and Plan / ED Course  I have reviewed the triage vital signs and the nursing notes.  Pertinent labs & imaging results that were available during my care of the patient were reviewed by me and considered in my medical decision making (see chart for details).       Final Clinical Impressions(s) / ED Diagnoses MDm Vital signs reviewed.  Patient is in no distress, but itching seems to be increasing during the  course of my interview and examination.  No hives noted during the emergency department visit.  I suspect that the patient have exposure to mites/bedbugs.  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  And to consider plastic covers on the cough chairs that time they sit in.  I also asked the patient to vacuum and/or shampoo to the carpeted areas of her home.  To wash her clothing daily and to change her Linen daily.  Patient will be treated with Vistaril and short course of steroid as well as triamcinolone cream.  Patient is  to return to the emergency department if any changes, problems, or concerns.   Final diagnoses:  Rash and nonspecific skin eruption    ED Discharge Orders        Ordered    hydrOXYzine (VISTARIL) 25 MG capsule  Every 6 hours PRN     05/07/17 0025    dexamethasone (DECADRON) 4 MG tablet  2 times daily with meals     05/07/17 0025    triamcinolone cream (KENALOG) 0.1 %  2 times daily     05/07/17 0029       Lily Kocher, PA-C 05/07/17 8250    Julianne Rice, MD 05/10/17 1004

## 2017-05-07 MED ORDER — DEXAMETHASONE 4 MG PO TABS: 4.0000 mg | ORAL_TABLET | Freq: Two times a day (BID) | ORAL | 0 refills | Status: DC

## 2017-05-07 MED ORDER — HYDROXYZINE PAMOATE 25 MG PO CAPS: 25.0000 mg | ORAL_CAPSULE | Freq: Four times a day (QID) | ORAL | 1 refills | Status: DC | PRN

## 2017-05-07 MED ORDER — TRIAMCINOLONE ACETONIDE 0.1 % EX CREA: 1.0000 "application " | TOPICAL_CREAM | Freq: Two times a day (BID) | CUTANEOUS | 1 refills | Status: DC

## 2017-05-07 NOTE — Discharge Instructions (Addendum)
Please have the carpeted area of your workplace vacuum, and or shampooed until this issue is resolved.  You may want to consider vacuuming your cough chair, and also using a plastic chair cover.  Please vacuum the carpeted area of your home.  Please change her close for sitting on couch as her beds and wash them daily.  You may need to spray for mites, and/or bedbugs.  Please use Decadron 2 times daily with food.  Please applied triamcinolone 2 times daily.  Please use Vistaril every 6 hours for itching.  Vistaril may cause drowsiness.  Please do not drive, drink, operate machinery, or participate in activities requiring concentration when taking this medication.  Please return to the emergency department or see your primary physician if not improving.

## 2017-06-19 ENCOUNTER — Other Ambulatory Visit: Payer: Self-pay

## 2017-06-19 ENCOUNTER — Encounter (HOSPITAL_COMMUNITY): Payer: Self-pay | Admitting: *Deleted

## 2017-06-19 ENCOUNTER — Emergency Department (HOSPITAL_COMMUNITY)
Admission: EM | Admit: 2017-06-19 | Discharge: 2017-06-19 | Disposition: A | Discharge status: Home / Self Care | Payer: Self-pay | Attending: Emergency Medicine | Admitting: Emergency Medicine

## 2017-06-19 ENCOUNTER — Emergency Department (HOSPITAL_COMMUNITY): Payer: Self-pay

## 2017-06-19 DIAGNOSIS — J111 Influenza due to unidentified influenza virus with other respiratory manifestations: Secondary | ICD-10-CM | POA: Insufficient documentation

## 2017-06-19 DIAGNOSIS — F17210 Nicotine dependence, cigarettes, uncomplicated: Secondary | ICD-10-CM | POA: Insufficient documentation

## 2017-06-19 DIAGNOSIS — Z79899 Other long term (current) drug therapy: Secondary | ICD-10-CM | POA: Insufficient documentation

## 2017-06-19 LAB — INFLUENZA PANEL BY PCR (TYPE A & B)
Influenza A By PCR: POSITIVE — AB
Influenza B By PCR: NEGATIVE

## 2017-06-19 LAB — RAPID STREP SCREEN (MED CTR MEBANE ONLY): Streptococcus, Group A Screen (Direct): NEGATIVE

## 2017-06-19 MED ORDER — ACETAMINOPHEN 325 MG PO TABS
ORAL_TABLET | ORAL | Status: AC
  Filled 2017-06-19: qty 2

## 2017-06-19 MED ORDER — IBUPROFEN 800 MG PO TABS
800.0000 mg | ORAL_TABLET | Freq: Once | ORAL | Status: AC
  Administered 2017-06-19: 800 mg via ORAL
  Filled 2017-06-19: qty 1

## 2017-06-19 MED ORDER — OSELTAMIVIR PHOSPHATE 75 MG PO CAPS: 75.0000 mg | ORAL_CAPSULE | Freq: Two times a day (BID) | ORAL | 0 refills | Status: DC

## 2017-06-19 MED ORDER — ACETAMINOPHEN 325 MG PO TABS
650.0000 mg | ORAL_TABLET | Freq: Once | ORAL | Status: AC | PRN
  Administered 2017-06-19: 650 mg via ORAL

## 2017-06-19 MED ORDER — BENZONATATE 100 MG PO CAPS: 200.0000 mg | ORAL_CAPSULE | Freq: Three times a day (TID) | ORAL | 0 refills | Status: DC | PRN

## 2017-06-19 MED ORDER — IBUPROFEN 800 MG PO TABS: 800.0000 mg | ORAL_TABLET | Freq: Three times a day (TID) | ORAL | 0 refills | Status: DC

## 2017-06-19 NOTE — ED Triage Notes (Signed)
Sore throat with vomiting onset today

## 2017-06-19 NOTE — ED Provider Notes (Signed)
[facility] EMERGENCY DEPARTMENT Provider Note   CSN: [id number] Arrival date & time: 06/19/17  2108     History   Chief Complaint Chief Complaint  Patient presents with  . Cough    vomiting due to intense coughing per report    HPI Linda Warner is a 27 y.o. female.  HPI   Linda Warner is a 27 y.o. female who presents to the Emergency Department complaining of generalized body aches, cough, congestion, sore throat, and posttussive emesis.  Symptoms began earlier today.  She reports chills and "felt warm to touch" today.  She has been taking over-the-counter cough and cold medications without relief.  She states her cough is been nonproductive and excessive to the point that she vomits.  She has tolerated some fluids today and small amounts of food.  She states she did not take a flu shot this season.  She denies headaches, abdominal pain, diarrhea, dysuria, chest pain and shortness of breath.   Past Medical History:  Diagnosis Date  . Acne   . BV (bacterial vaginosis)   . Miscarriage   . 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 Pregnant   . Scoliosis   . Scoliosis     Patient Active Problem List   Diagnosis Date Noted  . Contraception management 02/27/2013  . 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 SCOLIOSIS 07/25/2008  . MOOD SWINGS 10/14/2006    Past Surgical History:  Procedure Laterality Date  . NO PAST SURGERIES      OB History    Gravida Para Term Preterm AB Living   2 1 1   1 1    SAB TAB Ectopic Multiple Live Births   1       1       Home Medications    Prior to Admission medications   Medication Sig Start Date End Date Taking? Authorizing Provider  dexamethasone (DECADRON) 4 MG tablet Take 1 tablet (4 mg total) by mouth 2 (two) times daily with a meal. 05/07/17   Lily Kocher, PA-C  hydrOXYzine (VISTARIL) 25 MG capsule Take 1 capsule (25 mg total) by mouth every 6 (six) hours as needed for itching. 05/07/17   Lily Kocher, PA-C  ibuprofen  (ADVIL,MOTRIN) 200 MG tablet Take 800 mg by mouth daily as needed for mild pain or moderate pain.    [provider]  levonorgestrel (MIRENA) 20 MCG/24HR IUD 1 each by Intrauterine route once.    [provider]  triamcinolone cream (KENALOG) 0.1 % Apply 1 application topically 2 (two) times daily. 05/07/17   Lily Kocher, PA-C    Family History Family History  Problem Relation Age of Onset  . Depression Father   . Depression Maternal Grandmother   . Diabetes Maternal Grandmother   . Hyperlipidemia Maternal Grandmother   . Hypertension Maternal Grandmother   . Cancer Maternal Grandmother        breast  . Anesthesia problems Neg Hx     Social History Social History   Tobacco Use  . Smoking status: Current Some Day Smoker    Types: Cigarettes  . Smokeless tobacco: Never Used  Substance Use Topics  . Alcohol use: Yes    Comment: occasionally  . Drug use: No     Allergies   Patient has no known allergies.   Review of Systems Review of Systems  Constitutional: Positive for appetite change, chills and fever.  HENT: Positive for congestion and sore throat. Negative for trouble swallowing.   Respiratory: Positive  for cough. Negative for chest tightness, shortness of breath and wheezing.   Cardiovascular: Negative for chest pain.  Gastrointestinal: Positive for vomiting. Negative for abdominal pain and nausea.  Genitourinary: Negative for dysuria and flank pain.  Musculoskeletal: Positive for myalgias. Negative for neck pain and neck stiffness.  Neurological: Negative for dizziness, weakness and headaches.     Physical Exam Updated Vital Signs BP 113/63   Pulse (!) 114   Temp 100.2 F (37.9 C) (Oral)   Resp 20   Ht 5\' 3"  (1.6 m)   Wt 99.3 kg (219 lb)   SpO2 98%   BMI 38.79 kg/m   Physical Exam  Constitutional: She is oriented to person, place, and time. She appears well-developed and well-nourished. No distress.  HENT:  Head: Atraumatic.    Mouth/Throat: Oropharynx is clear and moist.  Neck: Normal range of motion.  Cardiovascular: Normal rate and regular rhythm.  Pulmonary/Chest: Effort normal and breath sounds normal. No respiratory distress. She has no wheezes. She has no rales.  Abdominal: Soft. She exhibits no distension and no mass. There is no tenderness. There is no guarding.  Musculoskeletal: Normal range of motion.  Lymphadenopathy:    She has no cervical adenopathy.  Neurological: She is alert and oriented to person, place, and time. No sensory deficit.  Skin: Skin is warm. No rash noted.  Psychiatric: She has a normal mood and affect.  Nursing note and vitals reviewed.    ED Treatments / Results  Labs (all labs ordered are listed, but only abnormal results are displayed) Labs Reviewed  INFLUENZA PANEL BY PCR (TYPE A & B) - Abnormal; Notable for the following components:      Result Value   Influenza A By PCR POSITIVE (*)    All other components within normal limits  RAPID STREP SCREEN (NOT AT [facility])  CULTURE, GROUP A STREP [facility])    EKG  EKG Interpretation None       Radiology Dg Chest 2 View  Result Date: 06/19/2017 CLINICAL DATA:  Cough congestion and sore throat with vomiting since today. EXAM: CHEST  2 VIEW COMPARISON:  02/03/2016 FINDINGS: Shallow inspiration. Normal heart size and pulmonary vascularity. No focal airspace disease or consolidation in the lungs. No blunting of costophrenic angles. No pneumothorax. Mediastinal contours appear intact. Thoracolumbar scoliosis convex towards the right. IMPRESSION: No active cardiopulmonary disease. Electronically Signed   By: Lucienne Capers M.D.   On: 06/19/2017 22:22    Procedures Procedures (including critical care time)  Medications Ordered in ED Medications  acetaminophen (TYLENOL) tablet 650 mg (650 mg Oral Given 06/19/17 2149)  ibuprofen (ADVIL,MOTRIN) tablet 800 mg (800 mg Oral Given 06/19/17 2153)     Initial Impression / Assessment and  Plan / ED Course  I have reviewed the triage vital signs and the nursing notes.  Pertinent labs & imaging results that were available during my care of the patient were reviewed by me and considered in my medical decision making (see chart for details).     On recheck patient states she is feeling better, she has tolerated oral fluids without difficulty.  Discussed the chest x-ray and lab results with the patient.  Doubt PE.  She appears safe for discharge home agrees to treatment plan and she requests prescription for Tamiflu.  Also agrees to close outpatient follow-up and return precautions were given.  Final Clinical Impressions(s) / ED Diagnoses   Final diagnoses:  Influenza    ED Discharge Orders    None  Kem Parkinson, PA-C 06/19/17 2338    Davonna Belling, MD 06/20/17 [phone]

## 2017-06-19 NOTE — ED Notes (Signed)
Pt in xray

## 2017-06-19 NOTE — Discharge Instructions (Signed)
It is important that you drink plenty of fluids.  Tylenol every 4 hours for fever and/or body aches, do not take additional over-the-counter cough and cold medicines that contain acetaminophen.  Follow-up with your primary doctor or return to the ER for any worsening symptoms.

## 2017-06-22 LAB — CULTURE, GROUP A STREP (THRC)

## 2017-06-25 ENCOUNTER — Encounter (HOSPITAL_COMMUNITY): Payer: Self-pay | Admitting: Emergency Medicine

## 2017-06-25 ENCOUNTER — Emergency Department (HOSPITAL_COMMUNITY)
Admission: EM | Admit: 2017-06-25 | Discharge: 2017-06-25 | Disposition: A | Discharge status: Home / Self Care | Payer: Medicaid Other | Attending: Emergency Medicine | Admitting: Emergency Medicine

## 2017-06-25 ENCOUNTER — Other Ambulatory Visit: Payer: Self-pay

## 2017-06-25 DIAGNOSIS — F17210 Nicotine dependence, cigarettes, uncomplicated: Secondary | ICD-10-CM | POA: Insufficient documentation

## 2017-06-25 DIAGNOSIS — J069 Acute upper respiratory infection, unspecified: Secondary | ICD-10-CM | POA: Insufficient documentation

## 2017-06-25 DIAGNOSIS — Z79899 Other long term (current) drug therapy: Secondary | ICD-10-CM | POA: Insufficient documentation

## 2017-06-25 DIAGNOSIS — Z0279 Encounter for issue of other medical certificate: Secondary | ICD-10-CM | POA: Insufficient documentation

## 2017-06-25 NOTE — ED Triage Notes (Signed)
Pt states she was seen here for the flu and was taken out of work til the 14th. Pt state she wants to go back to work and need a clearance letter stating she can go back to work.  Pt states s/s of a cough

## 2017-06-25 NOTE — ED Provider Notes (Signed)
[facility] EMERGENCY DEPARTMENT Provider Note   CSN: [id number] Arrival date & time: 06/25/17  1738     History   Chief Complaint Chief Complaint  Patient presents with  . Follow-up    HPI Linda Warner is a 27 y.o. female.  The history is provided by the patient. No language interpreter was used.  Cough  This is a recurrent problem. The problem occurs constantly. The problem has been resolved. There has been no fever.  Pt was treated for flu.  Pt reports symptoms have resolved.  Pt needs a note to return to work   Past Medical History:  Diagnosis Date  . Acne   . BV (bacterial vaginosis)   . Miscarriage   . 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 Pregnant   . Scoliosis   . Scoliosis     Patient Active Problem List   Diagnosis Date Noted  . Contraception management 02/27/2013  . 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 SCOLIOSIS 07/25/2008  . MOOD SWINGS 10/14/2006    Past Surgical History:  Procedure Laterality Date  . NO PAST SURGERIES      OB History    Gravida Para Term Preterm AB Living   2 1 1   1 1    SAB TAB Ectopic Multiple Live Births   1       1       Home Medications    Prior to Admission medications   Medication Sig Start Date End Date Taking? Authorizing Provider  benzonatate (TESSALON) 100 MG capsule Take 2 capsules (200 mg total) by mouth 3 (three) times daily as needed for cough. Swallow whole, do not chew 06/19/17   Triplett, Tammy, PA-C  dexamethasone (DECADRON) 4 MG tablet Take 1 tablet (4 mg total) by mouth 2 (two) times daily with a meal. 05/07/17   Lily Kocher, PA-C  hydrOXYzine (VISTARIL) 25 MG capsule Take 1 capsule (25 mg total) by mouth every 6 (six) hours as needed for itching. 05/07/17   Lily Kocher, PA-C  ibuprofen (ADVIL,MOTRIN) 800 MG tablet Take 1 tablet (800 mg total) by mouth 3 (three) times daily. 06/19/17   Triplett, Tammy, PA-C  levonorgestrel (MIRENA) 20 MCG/24HR IUD 1 each by Intrauterine route once.    [provider]  oseltamivir (TAMIFLU) 75 MG capsule Take 1 capsule (75 mg total) by mouth every 12 (twelve) hours. 06/19/17   Triplett, Tammy, PA-C  triamcinolone cream (KENALOG) 0.1 % Apply 1 application topically 2 (two) times daily. 05/07/17   Lily Kocher, PA-C    Family History Family History  Problem Relation Age of Onset  . Depression Father   . Depression Maternal Grandmother   . Diabetes Maternal Grandmother   . Hyperlipidemia Maternal Grandmother   . Hypertension Maternal Grandmother   . Cancer Maternal Grandmother        breast  . Anesthesia problems Neg Hx     Social History Social History   Tobacco Use  . Smoking status: Current Some Day Smoker    Types: Cigarettes  . Smokeless tobacco: Never Used  Substance Use Topics  . Alcohol use: Yes    Comment: occasionally  . Drug use: No     Allergies   Patient has no known allergies.   Review of Systems Review of Systems  Respiratory: Positive for cough.   All other systems reviewed and are negative.    Physical Exam Updated Vital Signs BP (!) 127/100   Pulse 78   Temp 98.3 F (36.8  C) (Oral)   Resp 18   Wt 99.3 kg (219 lb)   SpO2 98%   BMI 38.79 kg/m   Physical Exam  Constitutional: She is oriented to person, place, and time. She appears well-developed and well-nourished.  HENT:  Head: Normocephalic.  Eyes: EOM are normal.  Neck: Normal range of motion.  Pulmonary/Chest: Effort normal.  Abdominal: She exhibits no distension.  Musculoskeletal: Normal range of motion.  Neurological: She is alert and oriented to person, place, and time.  Psychiatric: She has a normal mood and affect.  Nursing note and vitals reviewed.    ED Treatments / Results  Labs (all labs ordered are listed, but only abnormal results are displayed) Labs Reviewed - No data to display  EKG  EKG Interpretation None       Radiology No results found.  Procedures Procedures (including critical care  time)  Medications Ordered in ED Medications - No data to display   Initial Impression / Assessment and Plan / ED Course  I have reviewed the triage vital signs and the nursing notes.  Pertinent labs & imaging results that were available during my care of the patient were reviewed by me and considered in my medical decision making (see chart for details).     MDM  Pt given note that she can return to work tomorrow.   Final Clinical Impressions(s) / ED Diagnoses   Final diagnoses:  Viral upper respiratory tract infection    ED Discharge Orders    None    An After Visit Summary was printed and given to the patient.    Sidney Ace 06/25/17 2112    Fredia Sorrow, MD 06/26/17 [phone]

## 2017-06-25 NOTE — Discharge Instructions (Signed)
Return if any problems.

## 2018-03-22 ENCOUNTER — Encounter (HOSPITAL_COMMUNITY): Payer: Self-pay

## 2018-03-22 ENCOUNTER — Other Ambulatory Visit: Payer: Self-pay

## 2018-03-22 ENCOUNTER — Emergency Department (HOSPITAL_COMMUNITY)
Admission: EM | Admit: 2018-03-22 | Discharge: 2018-03-22 | Disposition: A | Discharge status: Home / Self Care | Payer: Medicaid Other | Attending: Emergency Medicine | Admitting: Emergency Medicine

## 2018-03-22 DIAGNOSIS — Z79899 Other long term (current) drug therapy: Secondary | ICD-10-CM | POA: Insufficient documentation

## 2018-03-22 DIAGNOSIS — F17210 Nicotine dependence, cigarettes, uncomplicated: Secondary | ICD-10-CM | POA: Insufficient documentation

## 2018-03-22 DIAGNOSIS — R1030 Lower abdominal pain, unspecified: Secondary | ICD-10-CM | POA: Insufficient documentation

## 2018-03-22 LAB — COMPREHENSIVE METABOLIC PANEL
ALBUMIN: 4.3 g/dL (ref 3.5–5.0)
ALK PHOS: 65 U/L (ref 38–126)
ALT: 15 U/L (ref 0–44)
ANION GAP: 7 (ref 5–15)
AST: 12 U/L — ABNORMAL LOW (ref 15–41)
BILIRUBIN TOTAL: 0.5 mg/dL (ref 0.3–1.2)
BUN: 12 mg/dL (ref 6–20)
CALCIUM: 9.3 mg/dL (ref 8.9–10.3)
CO2: 27 mmol/L (ref 22–32)
Chloride: 105 mmol/L (ref 98–111)
Creatinine, Ser: 0.75 mg/dL (ref 0.44–1.00)
GFR calc Af Amer: >60 mL/min (ref >60)
GFR calc non Af Amer: >60 mL/min (ref >60)
GLUCOSE: 80 mg/dL (ref 70–99)
Potassium: 3.8 mmol/L (ref 3.5–5.1)
Sodium: 139 mmol/L (ref 135–145)
TOTAL PROTEIN: 8.1 g/dL (ref 6.5–8.1)

## 2018-03-22 LAB — WET PREP, GENITAL
Clue Cells Wet Prep HPF POC: NONE SEEN
Sperm: NONE SEEN
Trich, Wet Prep: NONE SEEN
YEAST WET PREP: NONE SEEN

## 2018-03-22 LAB — CBC
HCT: 38.4 % (ref 36.0–46.0)
Hemoglobin: 12.6 g/dL (ref 12.0–15.0)
MCH: 27.4 pg (ref 26.0–34.0)
MCHC: 32.8 g/dL (ref 30.0–36.0)
MCV: 83.5 fL (ref 80.0–100.0)
PLATELETS: 361 10*3/uL (ref 150–400)
RBC: 4.6 MIL/uL (ref 3.87–5.11)
RDW: 13.6 % (ref 11.5–15.5)
WBC: 11.5 10*3/uL — ABNORMAL HIGH (ref 4.0–10.5)
nRBC: 0 % (ref 0.0–0.2)

## 2018-03-22 LAB — URINALYSIS, ROUTINE W REFLEX MICROSCOPIC
Bilirubin Urine: NEGATIVE
Glucose, UA: NEGATIVE mg/dL
HGB URINE DIPSTICK: NEGATIVE
KETONES UR: NEGATIVE mg/dL
Leukocytes, UA: NEGATIVE
Nitrite: NEGATIVE
Protein, ur: NEGATIVE mg/dL
Specific Gravity, Urine: 1.024 (ref 1.005–1.030)
pH: 5 (ref 5.0–8.0)

## 2018-03-22 LAB — LIPASE, BLOOD: Lipase: 21 U/L (ref 11–51)

## 2018-03-22 LAB — PREGNANCY, URINE: PREG TEST UR: NEGATIVE

## 2018-03-22 MED ORDER — KETOROLAC TROMETHAMINE 30 MG/ML IJ SOLN
30.0000 mg | Freq: Once | INTRAMUSCULAR | Status: AC
  Administered 2018-03-22: 30 mg via INTRAMUSCULAR
  Filled 2018-03-22 (×2): qty 1

## 2018-03-22 NOTE — ED Triage Notes (Signed)
Pt presents to ED with complaints of lower abdominal pain since yesterday. Pt states her abdomen also hurts when she pees. Pt denies nausea and vomiting.

## 2018-03-22 NOTE — ED Provider Notes (Signed)
[facility] EMERGENCY DEPARTMENT Provider Note   CSN: [id number] Arrival date & time: 03/22/18  1210     History   Chief Complaint Chief Complaint  Patient presents with  . Abdominal Pain    HPI Linda Warner is a 27 y.o. female.  27 y.o female with a PMH of trichomonas and chlamydia Zentz to the ED with a chief complaint of abdominal pain, pain with urination since yesterday.  Patient describes her pain is located along the lower abdomen region and describes it as sharp.  She reports this morning she tried to urinate but reports she felt a sharp pain going up from her urethra all the way to her stomach.  She currently has an IUD Mirena that was supposed to be removed last month but patient missed the appointment.  Reports no new sexual partners.  Has taken ibuprofen x3 for relieving symptoms but states no relief.  Denies any fever, nausea, vomiting, diarrhea or back pain.     Past Medical History:  Diagnosis Date  . Acne   . BV (bacterial vaginosis)   . Miscarriage   . 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 Pregnant   . Scoliosis   . Scoliosis     Patient Active Problem List   Diagnosis Date Noted  . Contraception management 02/27/2013  . 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 SCOLIOSIS 07/25/2008  . MOOD SWINGS 10/14/2006    Past Surgical History:  Procedure Laterality Date  . NO PAST SURGERIES       OB History    Gravida  2   Para  1   Term  1   Preterm      AB  1   Living  1     SAB  1   TAB      Ectopic      Multiple      Live Births  1            Home Medications    Prior to Admission medications   Medication Sig Start Date End Date Taking? Authorizing Provider  benzonatate (TESSALON) 100 MG capsule Take 2 capsules (200 mg total) by mouth 3 (three) times daily as needed for cough. Swallow whole, do not chew 06/19/17   Triplett, Tammy, PA-C  dexamethasone (DECADRON) 4 MG tablet Take 1 tablet (4 mg total) by mouth 2 (two) times daily  with a meal. 05/07/17   Lily Kocher, PA-C  hydrOXYzine (VISTARIL) 25 MG capsule Take 1 capsule (25 mg total) by mouth every 6 (six) hours as needed for itching. 05/07/17   Lily Kocher, PA-C  ibuprofen (ADVIL,MOTRIN) 800 MG tablet Take 1 tablet (800 mg total) by mouth 3 (three) times daily. 06/19/17   Triplett, Tammy, PA-C  levonorgestrel (MIRENA) 20 MCG/24HR IUD 1 each by Intrauterine route once.    [provider]  oseltamivir (TAMIFLU) 75 MG capsule Take 1 capsule (75 mg total) by mouth every 12 (twelve) hours. 06/19/17   Triplett, Tammy, PA-C  triamcinolone cream (KENALOG) 0.1 % Apply 1 application topically 2 (two) times daily. 05/07/17   Lily Kocher, PA-C    Family History Family History  Problem Relation Age of Onset  . Depression Father   . Depression Maternal Grandmother   . Diabetes Maternal Grandmother   . Hyperlipidemia Maternal Grandmother   . Hypertension Maternal Grandmother   . Cancer Maternal Grandmother        breast  . Anesthesia problems Neg Hx     Social History  Social History   Tobacco Use  . Smoking status: Current Some Day Smoker    Types: Cigarettes  . Smokeless tobacco: Never Used  Substance Use Topics  . Alcohol use: Yes    Comment: occasionally  . Drug use: No     Allergies   Patient has no known allergies.   Review of Systems Review of Systems  Constitutional: Negative for fever.  HENT: Negative for sore throat.   Respiratory: Negative for shortness of breath.   Cardiovascular: Negative for chest pain.  Gastrointestinal: Positive for abdominal pain. Negative for diarrhea, nausea and vomiting.  Genitourinary: Positive for difficulty urinating and dysuria. Negative for flank pain.  Musculoskeletal: Negative for back pain.  Skin: Negative for pallor and wound.  Neurological: Negative for light-headedness and headaches.     Physical Exam Updated Vital Signs BP 136/84 (BP Location: Right Arm)   Pulse (!) 110   Temp 97.6 F  (36.4 C) (Tympanic)   Resp 18   Ht 5\' 3"  (1.6 m)   Wt 98.9 kg   SpO2 100%   BMI 38.62 kg/m   Physical Exam  Constitutional: She is oriented to person, place, and time. She appears well-developed and well-nourished. No distress.  HENT:  Head: Normocephalic and atraumatic.  Mouth/Throat: Oropharynx is clear and moist. No oropharyngeal exudate.  Eyes: Pupils are equal, round, and reactive to light.  Neck: Normal range of motion.  Cardiovascular: Regular rhythm and normal heart sounds.  Pulmonary/Chest: Effort normal and breath sounds normal. No respiratory distress.  Abdominal: Soft. Bowel sounds are normal. She exhibits no distension. There is tenderness in the right lower quadrant and suprapubic area. There is tenderness at McBurney's point. There is no rigidity, no rebound, no guarding and no CVA tenderness.  Genitourinary: Pelvic exam was performed with patient supine. Cervix exhibits no motion tenderness and no discharge. Right adnexum displays no tenderness. Left adnexum displays no tenderness. No erythema or bleeding in the vagina. No foreign body in the vagina. No vaginal discharge found.  Genitourinary Comments: No adnexal tenderness, discharge, bleeding or CMT noted on exam. Chaperone by Daphyne RN.   Musculoskeletal: She exhibits no tenderness or deformity.       Right lower leg: She exhibits no edema.       Left lower leg: She exhibits no edema.  Neurological: She is alert and oriented to person, place, and time.  Skin: Skin is warm and dry.  Psychiatric: She has a normal mood and affect.  Nursing note and vitals reviewed.    ED Treatments / Results  Labs (all labs ordered are listed, but only abnormal results are displayed) Labs Reviewed  COMPREHENSIVE METABOLIC PANEL - Abnormal; Notable for the following components:      Result Value   AST 12 (*)    All other components within normal limits  CBC - Abnormal; Notable for the following components:   WBC 11.5 (*)     All other components within normal limits  URINALYSIS, ROUTINE W REFLEX MICROSCOPIC - Abnormal; Notable for the following components:   APPearance HAZY (*)    All other components within normal limits  URINE CULTURE  WET PREP, GENITAL  LIPASE, BLOOD  PREGNANCY, URINE  GC/CHLAMYDIA PROBE AMP ([place]) NOT AT [facility]    EKG None  Radiology No results found.  Procedures Procedures (including critical care time)  Medications Ordered in ED Medications  ketorolac (TORADOL) 30 MG/ML injection 30 mg (has no administration in time range)     Initial  Impression / Assessment and Plan / ED Course  I have reviewed the triage vital signs and the nursing notes.  Pertinent labs & imaging results that were available during my care of the patient were reviewed by me and considered in my medical decision making (see chart for details).    Presents with domino pain which began this morning.  CBC shows slight increase in her white blood cell count at 11.5, hemoglobin stable 12.6.  CMP showed no electrolyte abnormality no weakness on exam, denies any emesis.  AST and ALT are within normal limits, she does report some pain when urination her urine showed no nitrates, leukocytes, white blood cell count but had hazy appearance we will send urine off for culture.  Pelvic exam performed obtain GC and chlamydia probe along with wet prep wet prep showed no white blood cell counts, no trichomonas, yeast, clue cells.  At this time I have advised patient that we will have her follow-up with her primary care physician as needed.  She may continue to take ibuprofen or Tylenol for the pain but patient is afebrile.  Slight tachycardia during her table in the ED.  She denies any fevers at home.  According to patient's previous visits she did show some tachycardia during her other visits but provided her with oral fluids along with pain medication and heart rate improved.  She reports she was supposed to get her IUD  taken out last month but was unable to schedule the appointment due to work conflict.  I have advised patient that she needs to schedule this appointment to get her IUD removed, her work-up today was benign she is to follow-up with primary care as needed.  Patient understands and agrees with plan.  Return precautions provided.  Final Clinical Impressions(s) / ED Diagnoses   Final diagnoses:  Lower abdominal pain    ED Discharge Orders    None       Janeece Fitting, PA-C 03/22/18 1446    Elnora Morrison, MD 03/25/18 2247

## 2018-03-22 NOTE — Discharge Instructions (Signed)
All laboratory results today were normal.  Please schedule an appointment to have your IUD removed within the next couple weeks.  You may also follow-up with your primary care physician as needed.  Ibuprofen or Tylenol for the pain.  If you experience any fever, vomiting, worsening symptoms please return the ED for reevaluation.

## 2018-03-23 LAB — GC/CHLAMYDIA PROBE AMP (~~LOC~~) NOT AT ARMC
CHLAMYDIA, DNA PROBE: NEGATIVE
NEISSERIA GONORRHEA: NEGATIVE

## 2018-03-24 LAB — URINE CULTURE: Culture: 70000 — AB

## 2018-03-25 ENCOUNTER — Telehealth: Payer: Self-pay | Admitting: *Deleted

## 2018-03-25 NOTE — Telephone Encounter (Signed)
Post ED Visit - Positive Culture Follow-up  Culture report reviewed by antimicrobial stewardship pharmacist:  []  Elenor Quinones, Pharm.D. []  Heide Guile, Pharm.D., BCPS AQ-ID []  Parks Neptune, Pharm.D., BCPS []  Alycia Rossetti, Pharm.D., BCPS []  Poydras, Pharm.D., BCPS, AAHIVP []  Legrand Como, Pharm.D., BCPS, AAHIVP []  Salome Arnt, PharmD, BCPS []  Johnnette Gourd, PharmD, BCPS []  Hughes Better, PharmD, BCPS []  Leeroy Cha, PharmD  Positive urine culture, reviewed by Rodell Perna, PA  UA neg nitrate, leukocytes and Hgb and no further patient follow-up is required at this time.  Harlon Flor [facility] 03/25/2018, 9:10 AM

## 2018-04-06 ENCOUNTER — Ambulatory Visit: Payer: Self-pay | Admitting: Obstetrics and Gynecology

## 2018-04-11 ENCOUNTER — Ambulatory Visit (INDEPENDENT_AMBULATORY_CARE_PROVIDER_SITE_OTHER): Payer: Medicaid Other | Admitting: Obstetrics and Gynecology

## 2018-04-11 ENCOUNTER — Encounter: Payer: Self-pay | Admitting: Obstetrics and Gynecology

## 2018-04-11 ENCOUNTER — Other Ambulatory Visit (HOSPITAL_COMMUNITY)
Admission: RE | Admit: 2018-04-11 | Discharge: 2018-04-11 | Disposition: A | Discharge status: Home / Self Care | Payer: Self-pay | Source: Ambulatory Visit | Attending: Obstetrics and Gynecology | Admitting: Obstetrics and Gynecology

## 2018-04-11 VITALS — BP 126/87 | HR 82 | Ht 63.0 in | Wt 216.8 lb

## 2018-04-11 DIAGNOSIS — Z309 Encounter for contraceptive management, unspecified: Secondary | ICD-10-CM

## 2018-04-11 DIAGNOSIS — Z01419 Encounter for gynecological examination (general) (routine) without abnormal findings: Secondary | ICD-10-CM

## 2018-04-11 NOTE — Progress Notes (Signed)
Patient ID: Linda Warner, female   DOB: 1990-11-26, 26 y.o.   MRN: [id number]  Assessment:  1) Annual Gyn Exam 2) Obesity Body mass index is 38.4 kg/m.  3 retained tampon Plan:  1. pap smear done, next pap due 3 yrs 2. return annually or prn 3    Annual mammogram advised after age 47 Subjective:  Linda Warner is a 27 y.o. female G2P1011 who presents for annual exam. No LMP recorded. (Menstrual status: IUD). The patient has complaints today of a vaginal odor. Denies abnormal discharge. She has had a Mirena IUD since 2014. She says it is still controlling her periods and her last period began 2 days ago. Her last pap was in 2011 and was normal. She [facility] last year and went to the ED on 03/22/2018 for abdominal pain. Her grandmother had breast cancer, but her mother and siblings did not.  The following portions of the patient's history were reviewed and updated as appropriate: allergies, current medications, past family history, past medical history, past social history, past surgical history and problem list. Past Medical History:  Diagnosis Date  . Acne   . BV (bacterial vaginosis)   . Miscarriage   . 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 Pregnant   . Scoliosis   . Scoliosis     Past Surgical History:  Procedure Laterality Date  . NO PAST SURGERIES      Current Outpatient Medications:  .  levonorgestrel (MIRENA) 20 MCG/24HR IUD, 1 each by Intrauterine route once., Disp: , Rfl:   Review of Systems Constitutional: negative Gastrointestinal: went to ED for abdominal pain on 03/22/2018 Genitourinary: negative  Objective:  BP 126/87 (BP Location: Left Arm, Patient Position: Sitting, Cuff Size: Normal)   Pulse 82   Ht 5\' 3"  (1.6 m)   Wt 216 lb 12.8 oz (98.3 kg)   BMI 38.40 kg/m    BMI: Body mass index is 38.4 kg/m.  General Appearance: Alert, appropriate appearance for age. No acute distress HEENT: Grossly normal Neck / Thyroid:   Cardiovascular: RRR; normal S1, S2, no murmur Lungs: CTA bilaterally Back: No CVAT Breast Exam: Normal. No lymphadenopathy or discharge. 4 cm healed scar on medial upper aspect of right breast Gastrointestinal: Soft, non-tender, no masses or organomegaly Pelvic Exam:  Vagina: Long. Pt accidentally left a tampon in, but says it could not have been there for more than 2 days Cervix: normal Rectovaginal: not indicated Lymphatic Exam: Non-palpable nodes in neck, clavicular, axillary, or inguinal regions  Skin: no rash or abnormalities Neurologic: Normal gait and speech, no tremor  Psychiatric: Alert and oriented, appropriate affect.  Urinalysis:Not done  By signing my name below, I, De Burrs, attest that this documentation has been prepared under the direction and in the presence of Jonnie Kind, MD. Electronically Signed: De Burrs, Medical Scribe. 04/11/18. 11:52 AM.  I personally performed the services described in this documentation, which was SCRIBED in my presence. The recorded information has been reviewed and considered accurate. It has been edited as necessary during review. Jonnie Kind, MD

## 2018-04-12 LAB — CYTOLOGY - PAP
CHLAMYDIA, DNA PROBE: NEGATIVE
Diagnosis: NEGATIVE
NEISSERIA GONORRHEA: NEGATIVE

## 2018-04-13 ENCOUNTER — Encounter: Payer: Self-pay | Admitting: Obstetrics and Gynecology

## 2018-04-13 ENCOUNTER — Ambulatory Visit (INDEPENDENT_AMBULATORY_CARE_PROVIDER_SITE_OTHER): Payer: Medicaid Other | Admitting: Obstetrics and Gynecology

## 2018-04-13 VITALS — BP 124/87 | HR 97 | Ht 63.0 in | Wt 216.4 lb

## 2018-04-13 DIAGNOSIS — N760 Acute vaginitis: Secondary | ICD-10-CM

## 2018-04-13 DIAGNOSIS — Z30432 Encounter for removal of intrauterine contraceptive device: Secondary | ICD-10-CM

## 2018-04-13 DIAGNOSIS — B9689 Other specified bacterial agents as the cause of diseases classified elsewhere: Secondary | ICD-10-CM | POA: Insufficient documentation

## 2018-04-13 MED ORDER — METRONIDAZOLE 500 MG PO TABS: 500.0000 mg | ORAL_TABLET | Freq: Two times a day (BID) | ORAL | 0 refills | Status: DC

## 2018-04-13 NOTE — Progress Notes (Signed)
Patient ID: Linda Warner, female   DOB: [birth year], 27 y.o.   MRN: [id number]     GYNECOLOGY OFFICE PROCEDURE NOTE  [name] is a 27 y.o. G2P1011 here for Mirena IUD removal. No GYN concerns.  Last pap smear was on 04/11/18 and was normal.  She notices odor from vagina, from retained tampon 04/09/18. Tampon was removed at office visit 04/11/18. She starts working 2 hours into her shift and will notice that she has a smell.    IUD Removal  Patient identified, informed consent performed, consent signed.  Patient was in the dorsal lithotomy position, normal external genitalia was noted.  A speculum was placed in the patient's vagina, normal discharge was noted, no lesions. The cervix was visualized, no lesions, no abnormal discharge.  The strings of the IUD were grasped and pulled using ring forceps. The IUD was removed in its entirety. Patient tolerated the procedure well.   Odor is present c/w BV. Pt is constipated, and has marked vaginal length, so visualizing cervix is difficult. Snowman speculum inverted was successful. P: 1. Rx Metronidazole, BID 7 days 2. Patient will use condoms for contraception and she was told to avoid teratogens, take PNV and folic acid.  Routine preventative health maintenance measures emphasized.     By signing my name below, I, Samul Dada, attest that this documentation has been prepared under the direction and in the presence of Jonnie Kind, MD. Electronically Signed: [place]. 04/13/18. 11:21 AM.  I personally performed the services described in this documentation, which was SCRIBED in my presence. The recorded information has been reviewed and considered accurate. It has been edited as necessary during review. Jonnie Kind, MD

## 2018-06-05 ENCOUNTER — Encounter (HOSPITAL_COMMUNITY): Payer: Self-pay | Admitting: Emergency Medicine

## 2018-06-05 ENCOUNTER — Other Ambulatory Visit: Payer: Self-pay

## 2018-06-05 ENCOUNTER — Emergency Department (HOSPITAL_COMMUNITY)
Admission: EM | Admit: 2018-06-05 | Discharge: 2018-06-05 | Disposition: A | Discharge status: Home / Self Care | Payer: Self-pay | Attending: Emergency Medicine | Admitting: Emergency Medicine

## 2018-06-05 DIAGNOSIS — E860 Dehydration: Secondary | ICD-10-CM | POA: Insufficient documentation

## 2018-06-05 DIAGNOSIS — R112 Nausea with vomiting, unspecified: Secondary | ICD-10-CM | POA: Insufficient documentation

## 2018-06-05 DIAGNOSIS — R197 Diarrhea, unspecified: Secondary | ICD-10-CM | POA: Insufficient documentation

## 2018-06-05 LAB — CBC
HCT: 39.2 % (ref 36.0–46.0)
HEMOGLOBIN: 13 g/dL (ref 12.0–15.0)
MCH: 27.6 pg (ref 26.0–34.0)
MCHC: 33.2 g/dL (ref 30.0–36.0)
MCV: 83.2 fL (ref 80.0–100.0)
Platelets: 394 10*3/uL (ref 150–400)
RBC: 4.71 MIL/uL (ref 3.87–5.11)
RDW: 13.2 % (ref 11.5–15.5)
WBC: 6.1 10*3/uL (ref 4.0–10.5)
nRBC: 0 % (ref 0.0–0.2)

## 2018-06-05 LAB — COMPREHENSIVE METABOLIC PANEL
ALBUMIN: 4.3 g/dL (ref 3.5–5.0)
ALK PHOS: 59 U/L (ref 38–126)
ALT: 27 U/L (ref 0–44)
ANION GAP: 9 (ref 5–15)
AST: 20 U/L (ref 15–41)
BILIRUBIN TOTAL: 0.7 mg/dL (ref 0.3–1.2)
BUN: 11 mg/dL (ref 6–20)
CO2: 25 mmol/L (ref 22–32)
Calcium: 9.4 mg/dL (ref 8.9–10.3)
Chloride: 109 mmol/L (ref 98–111)
Creatinine, Ser: 0.74 mg/dL (ref 0.44–1.00)
GFR calc Af Amer: >60 mL/min (ref >60)
GFR calc non Af Amer: >60 mL/min (ref >60)
GLUCOSE: 95 mg/dL (ref 70–99)
POTASSIUM: 4.3 mmol/L (ref 3.5–5.1)
SODIUM: 143 mmol/L (ref 135–145)
TOTAL PROTEIN: 7.9 g/dL (ref 6.5–8.1)

## 2018-06-05 LAB — URINALYSIS, ROUTINE W REFLEX MICROSCOPIC
BILIRUBIN URINE: NEGATIVE
Glucose, UA: NEGATIVE mg/dL
KETONES UR: NEGATIVE mg/dL
Leukocytes, UA: NEGATIVE
Nitrite: NEGATIVE
PROTEIN: 30 mg/dL — AB
Specific Gravity, Urine: 1.028 (ref 1.005–1.030)
pH: 7 (ref 5.0–8.0)

## 2018-06-05 LAB — PREGNANCY, URINE: PREG TEST UR: NEGATIVE

## 2018-06-05 LAB — I-STAT BETA HCG BLOOD, ED (MC, WL, AP ONLY)

## 2018-06-05 LAB — LIPASE, BLOOD: Lipase: 17 U/L (ref 11–51)

## 2018-06-05 MED ORDER — SODIUM CHLORIDE 0.9 % IV SOLN
INTRAVENOUS | Status: DC
  Administered 2018-06-05: 17:00:00 via INTRAVENOUS

## 2018-06-05 MED ORDER — ONDANSETRON HCL 4 MG/2ML IJ SOLN
4.0000 mg | Freq: Once | INTRAMUSCULAR | Status: AC
  Administered 2018-06-05: 4 mg via INTRAVENOUS
  Filled 2018-06-05: qty 2

## 2018-06-05 MED ORDER — ONDANSETRON 8 MG PO TBDP: 8.0000 mg | ORAL_TABLET | Freq: Three times a day (TID) | ORAL | 0 refills | Status: DC | PRN

## 2018-06-05 MED ORDER — SODIUM CHLORIDE 0.9 % IV BOLUS
1000.0000 mL | Freq: Once | INTRAVENOUS | Status: AC
  Administered 2018-06-05: 1000 mL via INTRAVENOUS

## 2018-06-05 MED ORDER — SODIUM CHLORIDE 0.9% FLUSH
3.0000 mL | Freq: Once | INTRAVENOUS | Status: AC
  Administered 2018-06-05: 3 mL via INTRAVENOUS

## 2018-06-05 NOTE — ED Notes (Signed)
Pt was informed that we need a urine sample. 

## 2018-06-05 NOTE — Discharge Instructions (Addendum)
Drink plenty of fluids, take the medications as needed for nausea, return as needed for worsening symptoms

## 2018-06-05 NOTE — ED Provider Notes (Signed)
[facility] EMERGENCY DEPARTMENT Provider Note   CSN: [id number] Arrival date & time: 06/05/18  1546     History   Chief Complaint Chief Complaint  Patient presents with  . Emesis    HPI Linda Warner is a 28 y.o. female.  HPI Patient presented to the emergency room for evaluation of nausea vomiting.  Patient states she did drink alcoholic beverages last night but no more than usual.  She had a couple of fireball whiskey drinks.  Today the patient is not sure if she has a hangover.  She has had multiple episodes of nausea and vomiting today.  She is also had some diarrhea.  He has not been able to keep down any food or liquids at all.  Whenever she tries to eat or drink she vomits.  Patient feels very dehydrated.  She is not having any abdominal pain but does have some pain up in her chest and throat when she vomits. Past Medical History:  Diagnosis Date  . Acne   . BV (bacterial vaginosis)   . Miscarriage   . 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 Pregnant   . Scoliosis   . Scoliosis     Patient Active Problem List   Diagnosis Date Noted  . BV (bacterial vaginosis) 04/13/2018  . Encounter for IUD removal 02/27/2013  . 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 SCOLIOSIS 07/25/2008  . MOOD SWINGS 10/14/2006    Past Surgical History:  Procedure Laterality Date  . NO PAST SURGERIES       OB History    Gravida  2   Para  1   Term  1   Preterm      AB  1   Living  1     SAB  1   TAB      Ectopic      Multiple      Live Births  1            Home Medications    Prior to Admission medications   Medication Sig Start Date End Date Taking? Authorizing Provider  ibuprofen (ADVIL,MOTRIN) 200 MG tablet Take 200 mg by mouth every 6 (six) hours as needed for fever or moderate pain.   Yes [provider]  ondansetron (ZOFRAN ODT) 8 MG disintegrating tablet Take 1 tablet (8 mg total) by mouth every 8 (eight) hours as needed for nausea or vomiting.  06/05/18   Dorie Rank, MD    Family History Family History  Problem Relation Age of Onset  . Depression Father   . Depression Maternal Grandmother   . Diabetes Maternal Grandmother   . Hyperlipidemia Maternal Grandmother   . Hypertension Maternal Grandmother   . Cancer Maternal Grandmother        breast  . Anesthesia problems Neg Hx     Social History Social History   Tobacco Use  . Smoking status: Current Some Day Smoker    Years: 2.00    Types: Cigarettes  . Smokeless tobacco: Never Used  Substance Use Topics  . Alcohol use: Yes    Comment: random  . Drug use: No     Allergies   Patient has no known allergies.   Review of Systems Review of Systems  All other systems reviewed and are negative.    Physical Exam Updated Vital Signs BP 138/89 (BP Location: Left Arm)   Pulse 95   Temp 97.9 F (36.6 C) (Oral)   Resp (!) 22   Ht  1.6 m (5\' 3" )   Wt 98 kg   LMP 05/31/2018   SpO2 100%   BMI 38.26 kg/m   Physical Exam Vitals signs and nursing note reviewed.  Constitutional:      General: She is not in acute distress.    Appearance: She is well-developed. She is ill-appearing.  HENT:     Head: Normocephalic and atraumatic.     Right Ear: External ear normal.     Left Ear: External ear normal.     Mouth/Throat:     Mouth: Mucous membranes are dry.  Eyes:     General: No scleral icterus.       Right eye: No discharge.        Left eye: No discharge.     Conjunctiva/sclera: Conjunctivae normal.  Neck:     Musculoskeletal: Neck supple.     Trachea: No tracheal deviation.  Cardiovascular:     Rate and Rhythm: Normal rate and regular rhythm.  Pulmonary:     Effort: Pulmonary effort is normal. No respiratory distress.     Breath sounds: Normal breath sounds. No stridor. No wheezing or rales.  Abdominal:     General: Bowel sounds are normal. There is no distension.     Palpations: Abdomen is soft.     Tenderness: There is no abdominal tenderness. There is  no guarding or rebound.  Musculoskeletal:        General: No tenderness.  Skin:    General: Skin is warm and dry.     Findings: No rash.  Neurological:     Mental Status: She is alert.     Cranial Nerves: No cranial nerve deficit (no facial droop, extraocular movements intact, no slurred speech).     Sensory: No sensory deficit.     Motor: No abnormal muscle tone or seizure activity.     Coordination: Coordination normal.      ED Treatments / Results  Labs (all labs ordered are listed, but only abnormal results are displayed) Labs Reviewed  URINALYSIS, ROUTINE W REFLEX MICROSCOPIC - Abnormal; Notable for the following components:      Result Value   APPearance HAZY (*)    Hgb urine dipstick MODERATE (*)    Protein, ur 30 (*)    Bacteria, UA RARE (*)    All other components within normal limits  LIPASE, BLOOD  COMPREHENSIVE METABOLIC PANEL  CBC  PREGNANCY, URINE  I-STAT BETA HCG BLOOD, ED ([facility], [facility], [facility] ONLY)  I-STAT BETA HCG BLOOD, ED ([facility], [facility], [facility] ONLY)     Procedures Procedures (including critical care time)  Medications Ordered in ED Medications  sodium chloride 0.9 % bolus 1,000 mL (1,000 mLs Intravenous New Bag/Given 06/05/18 1641)    And  0.9 %  sodium chloride infusion ( Intravenous New Bag/Given 06/05/18 1718)  sodium chloride flush (NS) 0.9 % injection 3 mL (3 mLs Intravenous Given 06/05/18 1642)  ondansetron (ZOFRAN) injection 4 mg (4 mg Intravenous Given 06/05/18 1642)     Initial Impression / Assessment and Plan / ED Course  I have reviewed the triage vital signs and the nursing notes.  Pertinent labs & imaging results that were available during my care of the patient were reviewed by me and considered in my medical decision making (see chart for details).  Clinical Course as of Jun 05 1802  Nancy Fetter Jun 05, 2018  1659 CBC is normal.  Pregnancy test is negative   [JK]  1802 Labs reviewed.  No significant abnormalities.  Urinalysis is consistent with  contamination.  I doubt UTI   [JK]    Clinical Course User Index [JK] Dorie Rank, MD    Patient symptoms are suggestive of either a viral gastroenteritis versus reaction to her alcohol consumption last evening.  Doubt appendicitis, cholecystitis, obstruction or other urgent etiology.  Patient was treated with IV fluids.  Her symptoms have improved.  She is no longer vomiting.  At 6 PM the patient notified us that she was feeling better and ready for discharge.  Final Clinical Impressions(s) / ED Diagnoses   Final diagnoses:  Nausea vomiting and diarrhea  Dehydration    ED Discharge Orders         Ordered    ondansetron (ZOFRAN ODT) 8 MG disintegrating tablet  Every 8 hours PRN     06/05/18 1803           Dorie Rank, MD 06/05/18 1805

## 2018-06-05 NOTE — ED Triage Notes (Signed)
Pt c/o n/v/d since last night. Was drinking alcohol last night but no more than  Usual. Pt vomiting in triage.

## 2018-06-05 NOTE — ED Notes (Signed)
Pt request ice chips   Encouraged to await a bit then will provide

## 2018-06-22 ENCOUNTER — Other Ambulatory Visit: Payer: Self-pay

## 2018-06-22 ENCOUNTER — Emergency Department (HOSPITAL_COMMUNITY)
Admission: EM | Admit: 2018-06-22 | Discharge: 2018-06-22 | Disposition: A | Discharge status: Home / Self Care | Payer: Self-pay | Attending: Emergency Medicine | Admitting: Emergency Medicine

## 2018-06-22 ENCOUNTER — Encounter (HOSPITAL_COMMUNITY): Payer: Self-pay | Admitting: *Deleted

## 2018-06-22 DIAGNOSIS — F17210 Nicotine dependence, cigarettes, uncomplicated: Secondary | ICD-10-CM | POA: Insufficient documentation

## 2018-06-22 DIAGNOSIS — K0889 Other specified disorders of teeth and supporting structures: Secondary | ICD-10-CM | POA: Insufficient documentation

## 2018-06-22 DIAGNOSIS — Z79899 Other long term (current) drug therapy: Secondary | ICD-10-CM | POA: Insufficient documentation

## 2018-06-22 MED ORDER — KETOROLAC TROMETHAMINE 60 MG/2ML IM SOLN
60.0000 mg | Freq: Once | INTRAMUSCULAR | Status: AC
  Administered 2018-06-22: 60 mg via INTRAMUSCULAR
  Filled 2018-06-22: qty 2

## 2018-06-22 MED ORDER — NAPROXEN 500 MG PO TABS: 500.0000 mg | ORAL_TABLET | Freq: Two times a day (BID) | ORAL | 0 refills | Status: DC | PRN

## 2018-06-22 MED ORDER — PENICILLIN V POTASSIUM 500 MG PO TABS: 500.0000 mg | ORAL_TABLET | Freq: Four times a day (QID) | ORAL | 0 refills | Status: AC

## 2018-06-22 MED ORDER — PENICILLIN V POTASSIUM 250 MG PO TABS
500.0000 mg | ORAL_TABLET | Freq: Once | ORAL | Status: AC
  Administered 2018-06-22: 500 mg via ORAL
  Filled 2018-06-22: qty 2

## 2018-06-22 NOTE — ED Provider Notes (Signed)
[facility] EMERGENCY DEPARTMENT Provider Note   CSN: [id number] Arrival date & time: 06/22/18  0147     History   Chief Complaint Chief Complaint  Patient presents with  . Dental Pain    HPI Linda Warner is a 28 y.o. female.  Patient presents with 3 days of left lower jaw pain and swelling which she attributes to her wisdom tooth coming in.  Comes in tonight with increasing pain.  Has been taking ibuprofen at home, last dose 10 minutes ago.  Denies any fevers, chills, nausea or vomiting.  No difficulty breathing or difficulty swallowing.  No chest pain or shortness of breath.  She does have a dentist has not seen them for a long time. She has also noticed a tender submandibular swelling on the L side.   The history is provided by the patient.  Dental Pain  Associated symptoms: no fever, no headaches and no neck pain     Past Medical History:  Diagnosis Date  . Acne   . BV (bacterial vaginosis)   . Miscarriage   . 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 Pregnant   . Scoliosis   . Scoliosis     Patient Active Problem List   Diagnosis Date Noted  . BV (bacterial vaginosis) 04/13/2018  . Encounter for IUD removal 02/27/2013  . 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 SCOLIOSIS 07/25/2008  . MOOD SWINGS 10/14/2006    Past Surgical History:  Procedure Laterality Date  . NO PAST SURGERIES       OB History    Gravida  2   Para  1   Term  1   Preterm      AB  1   Living  1     SAB  1   TAB      Ectopic      Multiple      Live Births  1            Home Medications    Prior to Admission medications   Medication Sig Start Date End Date Taking? Authorizing Provider  ibuprofen (ADVIL,MOTRIN) 200 MG tablet Take 200 mg by mouth every 6 (six) hours as needed for fever or moderate pain.    [provider]  ondansetron (ZOFRAN ODT) 8 MG disintegrating tablet Take 1 tablet (8 mg total) by mouth every 8 (eight) hours as needed for nausea or  vomiting. 06/05/18   Dorie Rank, MD    Family History Family History  Problem Relation Age of Onset  . Depression Father   . Depression Maternal Grandmother   . Diabetes Maternal Grandmother   . Hyperlipidemia Maternal Grandmother   . Hypertension Maternal Grandmother   . Cancer Maternal Grandmother        breast  . Anesthesia problems Neg Hx     Social History Social History   Tobacco Use  . Smoking status: Current Some Day Smoker    Years: 2.00    Types: Cigarettes  . Smokeless tobacco: Never Used  Substance Use Topics  . Alcohol use: Yes    Comment: random  . Drug use: No     Allergies   Patient has no known allergies.   Review of Systems Review of Systems  Constitutional: Negative for activity change, appetite change and fever.  HENT: Positive for dental problem.   Respiratory: Negative for chest tightness.   Cardiovascular: Negative for chest pain.  Gastrointestinal: Negative for abdominal pain, nausea and vomiting.  Genitourinary: Negative for  dysuria, hematuria, vaginal bleeding and vaginal discharge.  Musculoskeletal: Negative for arthralgias, myalgias and neck pain.  Skin: Negative for rash.  Neurological: Negative for dizziness, tremors, syncope and headaches.  Hematological: Negative for adenopathy.    all other systems are negative except as noted in the HPI and PMH.    Physical Exam Updated Vital Signs BP (!) 118/91 (BP Location: Right Arm)   Pulse 80   Temp 97.6 F (36.4 C) (Oral)   Resp 20   Ht 5\' 3"  (1.6 m)   Wt 98 kg   LMP 05/31/2018   SpO2 100%   BMI 38.26 kg/m   Physical Exam Vitals signs and nursing note reviewed.  Constitutional:      General: She is not in acute distress.    Appearance: She is well-developed.  HENT:     Head: Normocephalic and atraumatic.     Mouth/Throat:     Mouth: Mucous membranes are moist.     Pharynx: No oropharyngeal exudate.     Comments: Left third molars erupting through the gingiva.  It is  tender to palpation without surrounding fluctuance or induration. Floor mouth is soft.  Tender left submandibular lymph node on the left side Controlling secretions Eyes:     Conjunctiva/sclera: Conjunctivae normal.     Pupils: Pupils are equal, round, and reactive to light.  Neck:     Musculoskeletal: Normal range of motion and neck supple.     Comments: No meningismus. Tender L submandibular lymph node Cardiovascular:     Rate and Rhythm: Normal rate and regular rhythm.     Heart sounds: Normal heart sounds. No murmur.  Pulmonary:     Effort: Pulmonary effort is normal. No respiratory distress.     Breath sounds: Normal breath sounds.  Abdominal:     Palpations: Abdomen is soft.     Tenderness: There is no abdominal tenderness. There is no guarding or rebound.  Musculoskeletal: Normal range of motion.        General: No tenderness.  Lymphadenopathy:     Cervical: Cervical adenopathy present.  Skin:    General: Skin is warm.  Neurological:     Mental Status: She is alert and oriented to person, place, and time.     Cranial Nerves: No cranial nerve deficit.     Motor: No abnormal muscle tone.     Coordination: Coordination normal.     Comments: No ataxia on finger to nose bilaterally. No pronator drift. 5/5 strength throughout. CN 2-12 intact.Equal grip strength. Sensation intact.   Psychiatric:        Behavior: Behavior normal.      ED Treatments / Results  Labs (all labs ordered are listed, but only abnormal results are displayed) Labs Reviewed - No data to display  EKG None  Radiology No results found.  Procedures Procedures (including critical care time)  Medications Ordered in ED Medications  ketorolac (TORADOL) injection 60 mg (has no administration in time range)  penicillin v potassium (VEETID) tablet 500 mg (has no administration in time range)     Initial Impression / Assessment and Plan / ED Course  I have reviewed the triage vital signs and the  nursing notes.  Pertinent labs & imaging results that were available during my care of the patient were reviewed by me and considered in my medical decision making (see chart for details).    Dental pain with erupting third molar.  No evidence of abscess or Ludwig angina.  Controlling secretions.  Patient drove herself to the ED.  We will treat with nonnarcotic pain medication and antibiotics.  Discussed follow-up with dentist as soon as possible for further evaluation and possible extraction. Return precautions discussed.    Final Clinical Impressions(s) / ED Diagnoses   Final diagnoses:  Pain, dental    ED Discharge Orders    None       Jahni Paul, Annie Main, MD 06/22/18 [phone]

## 2018-06-22 NOTE — Discharge Instructions (Signed)
Take the antibiotics as prescribed.  Follow-up with a dentist from the attached list.  Return to the ED with difficulty breathing, difficulty swallowing or any other concerns.

## 2018-06-22 NOTE — ED Triage Notes (Signed)
Pt c/ left lower dental pain for the last couple of days; pt states it may be her wisdom teeth

## 2018-06-23 ENCOUNTER — Emergency Department (HOSPITAL_COMMUNITY): Payer: Self-pay

## 2018-06-23 ENCOUNTER — Other Ambulatory Visit: Payer: Self-pay

## 2018-06-23 ENCOUNTER — Emergency Department (HOSPITAL_COMMUNITY)
Admission: EM | Admit: 2018-06-23 | Discharge: 2018-06-23 | Disposition: A | Discharge status: Home / Self Care | Payer: Self-pay | Attending: Emergency Medicine | Admitting: Emergency Medicine

## 2018-06-23 ENCOUNTER — Encounter (HOSPITAL_COMMUNITY): Payer: Self-pay | Admitting: Emergency Medicine

## 2018-06-23 DIAGNOSIS — K0889 Other specified disorders of teeth and supporting structures: Secondary | ICD-10-CM | POA: Insufficient documentation

## 2018-06-23 DIAGNOSIS — F17210 Nicotine dependence, cigarettes, uncomplicated: Secondary | ICD-10-CM | POA: Insufficient documentation

## 2018-06-23 LAB — POC URINE PREG, ED: Preg Test, Ur: NEGATIVE

## 2018-06-23 MED ORDER — HYDROCODONE-ACETAMINOPHEN 5-325 MG PO TABS
1.0000 | ORAL_TABLET | Freq: Once | ORAL | Status: AC
  Administered 2018-06-23: 1 via ORAL
  Filled 2018-06-23: qty 1

## 2018-06-23 MED ORDER — HYDROCODONE-ACETAMINOPHEN 5-325 MG PO TABS: ORAL_TABLET | ORAL | 0 refills | Status: DC

## 2018-06-23 MED ORDER — IOHEXOL 300 MG/ML  SOLN
75.0000 mL | Freq: Once | INTRAMUSCULAR | Status: AC | PRN
  Administered 2018-06-23: 75 mL via INTRAVENOUS

## 2018-06-23 MED ORDER — CLINDAMYCIN PHOSPHATE 600 MG/50ML IV SOLN
600.0000 mg | Freq: Once | INTRAVENOUS | Status: AC
  Administered 2018-06-23: 600 mg via INTRAVENOUS
  Filled 2018-06-23: qty 50

## 2018-06-23 NOTE — ED Notes (Signed)
Patient states she has been taking Naprosyn and Ibuprofen 800 mg every 3 hours. Patient states she has been taking more than recommended amount because she is in extreme pain from wisdom tooth extraction. Patient states she has been taking Penicillin as recommended.

## 2018-06-23 NOTE — ED Triage Notes (Signed)
Pt returns with wisdom tooth pain. Was given medication 2 days ago to told to come back if no better.

## 2018-06-23 NOTE — Discharge Instructions (Addendum)
Continue taking your antibiotic as directed until it is finished.  Stop the naproxen and only take ibuprofen 800 mg 3 times a day with food.  Call 1 of the dentist on the list provided to arrange follow-up.

## 2018-06-23 NOTE — ED Provider Notes (Signed)
[facility] EMERGENCY DEPARTMENT Provider Note   CSN: [id number] Arrival date & time: 06/23/18  1522     History   Chief Complaint Chief Complaint  Patient presents with  . Dental Pain    HPI Linda Warner is a 28 y.o. female.  HPI   Linda Warner is a 28 y.o. female who presents to the Emergency Department complaining of persistent left lower dental pain.  She was seen here 2 days ago for the same.  She returns today with complaints of increasing pain and swelling of her face and neck and today she states she is having difficulty opening her mouth.  She has been taking penicillin 4 times a day and took ibuprofen and naproxen 3 hours apart x1 yesterday without relief of pain.  She describes swelling of her face and just below her jaw.  She states she was advised to follow-up with a dentist, but when she tried to make an appointment she was told that she could not be seen for 2 weeks.  She denies fever, chills, difficulty swallowing or breathing,  abdominal pain, and vomiting.        Past Medical History:  Diagnosis Date  . Acne   . BV (bacterial vaginosis)   . Miscarriage   . 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 Pregnant   . Scoliosis   . Scoliosis     Patient Active Problem List   Diagnosis Date Noted  . BV (bacterial vaginosis) 04/13/2018  . Encounter for IUD removal 02/27/2013  . 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 SCOLIOSIS 07/25/2008  . MOOD SWINGS 10/14/2006    Past Surgical History:  Procedure Laterality Date  . NO PAST SURGERIES       OB History    Gravida  2   Para  1   Term  1   Preterm      AB  1   Living  1     SAB  1   TAB      Ectopic      Multiple      Live Births  1            Home Medications    Prior to Admission medications   Medication Sig Start Date End Date Taking? Authorizing Provider  ibuprofen (ADVIL,MOTRIN) 200 MG tablet Take 200 mg by mouth every 6 (six) hours as needed for fever or moderate pain.     [provider]  naproxen (NAPROSYN) 500 MG tablet Take 1 tablet (500 mg total) by mouth 2 (two) times daily as needed. 06/22/18   Rancour, Annie Main, MD  ondansetron (ZOFRAN ODT) 8 MG disintegrating tablet Take 1 tablet (8 mg total) by mouth every 8 (eight) hours as needed for nausea or vomiting. 06/05/18   Dorie Rank, MD  penicillin v potassium (VEETID) 500 MG tablet Take 1 tablet (500 mg total) by mouth 4 (four) times daily for 7 days. 06/22/18 06/29/18  Ezequiel Essex, MD    Family History Family History  Problem Relation Age of Onset  . Depression Father   . Depression Maternal Grandmother   . Diabetes Maternal Grandmother   . Hyperlipidemia Maternal Grandmother   . Hypertension Maternal Grandmother   . Cancer Maternal Grandmother        breast  . Anesthesia problems Neg Hx     Social History Social History   Tobacco Use  . Smoking status: Current Some Day Smoker    Years: 2.00    Types:  Cigarettes  . Smokeless tobacco: Never Used  Substance Use Topics  . Alcohol use: Yes    Comment: random  . Drug use: No     Allergies   Patient has no known allergies.   Review of Systems Review of Systems  Constitutional: Negative for appetite change, chills and fever.  HENT: Positive for dental problem and facial swelling. Negative for congestion, sore throat and trouble swallowing.   Eyes: Negative for pain and visual disturbance.  Respiratory: Negative for chest tightness and shortness of breath.   Cardiovascular: Negative for chest pain.  Gastrointestinal: Negative for abdominal pain, diarrhea and vomiting.  Musculoskeletal: Negative for neck pain and neck stiffness.  Skin: Negative for rash.  Neurological: Negative for dizziness, syncope, facial asymmetry, weakness, numbness and headaches.  Hematological: Positive for adenopathy.     Physical Exam Updated Vital Signs BP (!) 134/97 (BP Location: Right Arm)   Pulse 76   Temp 98.1 F (36.7 C) (Oral)   Resp 18    Ht 5\' 3"  (1.6 m)   Wt 98 kg   LMP 05/31/2018   SpO2 100%   BMI 38.26 kg/m   Physical Exam Vitals signs and nursing note reviewed.  Constitutional:      General: She is not in acute distress.    Appearance: She is well-developed.  HENT:     Head: Normocephalic and atraumatic.     Jaw: No trismus.     Right Ear: Tympanic membrane and ear canal normal.     Left Ear: Tympanic membrane and ear canal normal.     Mouth/Throat:     Dentition: Dental tenderness present. No dental caries or dental abscesses.     Pharynx: Uvula midline. No pharyngeal swelling, oropharyngeal exudate or uvula swelling.      Comments: Patient has tenderness to palpation along a partially protruding left lower third molar.  Mild to moderate trismus is noted.  There is mild erythema of the surrounding gingiva.  No definite abscess.  No sublingual abnormalities. Neck:     Musculoskeletal: Normal range of motion and neck supple.  Cardiovascular:     Rate and Rhythm: Normal rate and regular rhythm.     Heart sounds: Normal heart sounds. No murmur.  Pulmonary:     Effort: Pulmonary effort is normal.     Breath sounds: Normal breath sounds.  Abdominal:     General: There is no distension.     Palpations: Abdomen is soft.     Tenderness: There is no abdominal tenderness.  Musculoskeletal: Normal range of motion.  Lymphadenopathy:     Cervical: Cervical adenopathy present.     Left cervical: Superficial cervical adenopathy present.     Comments: Palpable, enlarged left anterior cervical lymph node  Skin:    General: Skin is warm and dry.     Findings: No erythema or rash.  Neurological:     General: No focal deficit present.     Mental Status: She is alert.     Motor: No weakness or abnormal muscle tone.     Coordination: Coordination normal.      ED Treatments / Results  Labs (all labs ordered are listed, but only abnormal results are displayed) Labs Reviewed  POC URINE PREG, ED     EKG None  Radiology Ct Maxillofacial W Contrast  Result Date: 06/23/2018 CLINICAL DATA:  28 y/o  F; left-sided pain wisdom tooth pain. EXAM: CT MAXILLOFACIAL WITH CONTRAST TECHNIQUE: Multidetector CT imaging of the maxillofacial structures was performed  with intravenous contrast. Multiplanar CT image reconstructions were also generated. CONTRAST:  23mL OMNIPAQUE IOHEXOL 300 MG/ML  SOLN COMPARISON:  None. FINDINGS: Osseous: No fracture or mandibular dislocation. No destructive process. Orbits: Negative. No traumatic or inflammatory finding. Sinuses: Clear. Soft tissues: Mild inflammation of soft tissues overlying the left body of mandible. No abscess. Left submandibular lymphadenopathy measuring 23 x 20 mm (series 2, image 74). Limited intracranial: No significant or unexpected finding. IMPRESSION: 1. Mild inflammation of soft tissues overlying the left body of mandible. No abscess. 2. Left submandibular reactive lymphadenopathy. Electronically Signed   By: Kristine Garbe M.D.   On: 06/23/2018 19:19    Procedures Procedures (including critical care time)  Medications Ordered in ED Medications  clindamycin (CLEOCIN) IVPB 600 mg (has no administration in time range)     Initial Impression / Assessment and Plan / ED Course  I have reviewed the triage vital signs and the nursing notes.  Pertinent labs & imaging results that were available during my care of the patient were reviewed by me and considered in my medical decision making (see chart for details).     Patient with mild left-sided facial edema and prominent left cervical lymph node.   trismus is noted.  Patient states she took ibuprofen and naproxen yesterday closely together, but only once.  No abdominal pain or stomach upset.  She is handling secretions well and drink fluids here without difficulty.  CT negative for osteomyelitis and Ludewig's angina.  Patient currently on penicillin.  I have instructed her not to take  naproxen and ibuprofen together, she prefers to continue with ibuprofen only and I will write a short course of Vicodin.  She agrees to close follow-up with her dentist and referral information given.  Final Clinical Impressions(s) / ED Diagnoses   Final diagnoses:  Pain, dental    ED Discharge Orders    None       Bufford Lope 06/23/18 2046    Hayden Rasmussen, MD 06/23/18 [phone]

## 2019-02-03 ENCOUNTER — Other Ambulatory Visit: Payer: Self-pay | Admitting: Women's Health

## 2019-02-03 ENCOUNTER — Other Ambulatory Visit: Payer: Self-pay

## 2019-02-03 ENCOUNTER — Encounter (HOSPITAL_COMMUNITY): Payer: Self-pay

## 2019-02-03 ENCOUNTER — Other Ambulatory Visit: Payer: Self-pay | Admitting: Emergency Medicine

## 2019-02-03 ENCOUNTER — Emergency Department (HOSPITAL_COMMUNITY): Payer: Medicaid Other

## 2019-02-03 ENCOUNTER — Emergency Department (HOSPITAL_COMMUNITY)
Admission: EM | Admit: 2019-02-03 | Discharge: 2019-02-03 | Disposition: A | Discharge status: Home / Self Care | Payer: Medicaid Other | Attending: Emergency Medicine | Admitting: Emergency Medicine

## 2019-02-03 DIAGNOSIS — Z3490 Encounter for supervision of normal pregnancy, unspecified, unspecified trimester: Secondary | ICD-10-CM

## 2019-02-03 DIAGNOSIS — R1032 Left lower quadrant pain: Secondary | ICD-10-CM

## 2019-02-03 DIAGNOSIS — O23591 Infection of other part of genital tract in pregnancy, first trimester: Secondary | ICD-10-CM | POA: Insufficient documentation

## 2019-02-03 DIAGNOSIS — Z3A00 Weeks of gestation of pregnancy not specified: Secondary | ICD-10-CM | POA: Insufficient documentation

## 2019-02-03 DIAGNOSIS — O26891 Other specified pregnancy related conditions, first trimester: Secondary | ICD-10-CM | POA: Diagnosis present

## 2019-02-03 DIAGNOSIS — F17210 Nicotine dependence, cigarettes, uncomplicated: Secondary | ICD-10-CM | POA: Diagnosis not present

## 2019-02-03 DIAGNOSIS — B9689 Other specified bacterial agents as the cause of diseases classified elsewhere: Secondary | ICD-10-CM

## 2019-02-03 LAB — CBC WITH DIFFERENTIAL/PLATELET
Abs Immature Granulocytes: 0.02 10*3/uL (ref 0.00–0.07)
Basophils Absolute: 0 10*3/uL (ref 0.0–0.1)
Basophils Relative: 1 %
Eosinophils Absolute: 0.2 10*3/uL (ref 0.0–0.5)
Eosinophils Relative: 3 %
HCT: 38.2 % (ref 36.0–46.0)
Hemoglobin: 12.8 g/dL (ref 12.0–15.0)
Immature Granulocytes: 0 %
Lymphocytes Relative: 34 %
Lymphs Abs: 2.2 10*3/uL (ref 0.7–4.0)
MCH: 28.1 pg (ref 26.0–34.0)
MCHC: 33.5 g/dL (ref 30.0–36.0)
MCV: 83.8 fL (ref 80.0–100.0)
Monocytes Absolute: 0.6 10*3/uL (ref 0.1–1.0)
Monocytes Relative: 8 %
Neutro Abs: 3.6 10*3/uL (ref 1.7–7.7)
Neutrophils Relative %: 54 %
Platelets: 334 10*3/uL (ref 150–400)
RBC: 4.56 MIL/uL (ref 3.87–5.11)
RDW: 13.6 % (ref 11.5–15.5)
WBC: 6.6 10*3/uL (ref 4.0–10.5)
nRBC: 0 % (ref 0.0–0.2)

## 2019-02-03 LAB — URINALYSIS, ROUTINE W REFLEX MICROSCOPIC
Bilirubin Urine: NEGATIVE
Glucose, UA: NEGATIVE mg/dL
Hgb urine dipstick: NEGATIVE
Ketones, ur: NEGATIVE mg/dL
Leukocytes,Ua: NEGATIVE
Nitrite: NEGATIVE
Protein, ur: NEGATIVE mg/dL
Specific Gravity, Urine: 1.019 (ref 1.005–1.030)
pH: 7 (ref 5.0–8.0)

## 2019-02-03 LAB — WET PREP, GENITAL
Sperm: NONE SEEN
Trich, Wet Prep: NONE SEEN
Yeast Wet Prep HPF POC: NONE SEEN

## 2019-02-03 LAB — HCG, QUANTITATIVE, PREGNANCY: hCG, Beta Chain, Quant, S: 24782 m[IU]/mL — ABNORMAL HIGH (ref <5)

## 2019-02-03 LAB — I-STAT BETA HCG BLOOD, ED (MC, WL, AP ONLY): I-stat hCG, quantitative: >2000 m[IU]/mL — ABNORMAL HIGH (ref <5)

## 2019-02-03 MED ORDER — METRONIDAZOLE 500 MG PO TABS: 500.0000 mg | ORAL_TABLET | Freq: Two times a day (BID) | ORAL | 0 refills | Status: DC

## 2019-02-03 MED ORDER — METRONIDAZOLE 0.75 % VA GEL: 1.0000 | Freq: Two times a day (BID) | VAGINAL | 0 refills | Status: DC

## 2019-02-03 NOTE — ED Provider Notes (Signed)
Blood pressure 134/80, pulse (!) 3, temperature 98.8 F (37.1 C), temperature source Oral, resp. rate 16, height 5\' 3"  (1.6 m), weight 103.9 kg, last menstrual period 12/24/2018, SpO2 100 %.  Assuming care from Dr. Tomi Bamberger.  In short, Linda Warner is a 28 y.o. female with a chief complaint of No chief complaint on file. Marland Kitchen  Refer to the original H&P for additional details.  The current plan of care is to follow up on TVUS. Positive pregnancy test.  08:33 AM  Ultrasound shows intrauterine pregnancy.  Patient to follow with OB/GYN.  No vaginal bleeding.  Rh+.  Evidence of bacterial vaginosis on wet prep.  I have prescribed topical Flagyl and will have the patient continue with OB. Discussed ED return precautions.    Margette Fast, MD 02/03/19 [phone]

## 2019-02-03 NOTE — ED Triage Notes (Signed)
Lower abd pain/cramping x 1 week, states is scheduled to see dr Glo Herring soon, but pain worsened this morning.  Pt denies n/v/d.  Pt states she may be pregnant-took a home preg test positive.

## 2019-02-03 NOTE — Discharge Instructions (Signed)
He was seen in the emergency department today with abdominal discomfort.  Your ultrasound shows a pregnancy in the proper location.  Please follow-up with the OB/GYN for further evaluation management of your pregnancy.  You do have evidence of a bacterial vaginosis.  I have prescribed some topical antibiotic to use.  Please return to the emergency department with any new or suddenly worsening symptoms.

## 2019-02-03 NOTE — ED Provider Notes (Signed)
[facility] EMERGENCY DEPARTMENT Provider Note   CSN: [id number] Arrival date & time: 02/03/19  0535   Time seen 6:10 AM  History   Chief Complaint Pelvic pain  HPI Linda Warner is a 28 y.o. female.     HPI patient is G2, P1 AB 1, miscarriage at 8 weeks.  She states her miscarriage was the year before she got pregnant with her son who is now 75.  Patient states she had an IUD for 5 years which she thought caused a odor which has not resolved since it was removed in October.  She states her last normal period was August 8.  She started having abdominal cramping and breast soreness and she thought her period was go to start last week however it has it.  She states she did take a home pregnancy test this week that was positive.  She states tonight about 2 AM her pain got worse and it woke her up from sleep and she has been unable to go back to sleep.  She states her pain is in the suprapubic area and in the left lower quadrant.  She also complains of some very low back pain.  She denies any vaginal bleeding or spotting or dysuria but has had frequency for the past 1-1/2 weeks.  She denies nausea or vomiting.  She states nothing she does makes the pain hurt more but holding pressure on her abdomen makes it feel better.  PCP Patient, No Pcp Per [facility]  Past Medical History:  Diagnosis Date  . Acne   . BV (bacterial vaginosis)   . Miscarriage   . 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 Pregnant   . Scoliosis   . Scoliosis     Patient Active Problem List   Diagnosis Date Noted  . BV (bacterial vaginosis) 04/13/2018  . Encounter for IUD removal 02/27/2013  . 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 SCOLIOSIS 07/25/2008  . MOOD SWINGS 10/14/2006    Past Surgical History:  Procedure Laterality Date  . NO PAST SURGERIES       OB History    Gravida  2   Para  1   Term  1   Preterm      AB  1   Living  1     SAB  1   TAB      Ectopic      Multiple      Live Births  1            Home Medications    Prior to Admission medications   Medication Sig Start Date End Date Taking? Authorizing Provider  ibuprofen (ADVIL,MOTRIN) 200 MG tablet Take 200 mg by mouth every 6 (six) hours as needed for fever or moderate pain.   Yes [provider]  HYDROcodone-acetaminophen (NORCO/VICODIN) 5-325 MG tablet Take one tab po q 4 hrs prn pain 06/23/18   Triplett, Tammy, PA-C  naproxen (NAPROSYN) 500 MG tablet Take 1 tablet (500 mg total) by mouth 2 (two) times daily as needed. 06/22/18   Rancour, Annie Main, MD  ondansetron (ZOFRAN ODT) 8 MG disintegrating tablet Take 1 tablet (8 mg total) by mouth every 8 (eight) hours as needed for nausea or vomiting. 06/05/18   Dorie Rank, MD    Family History Family History  Problem Relation Age of Onset  . Depression Father   . Depression Maternal Grandmother   . Diabetes Maternal Grandmother   . Hyperlipidemia Maternal Grandmother   .  Hypertension Maternal Grandmother   . Cancer Maternal Grandmother        breast  . Anesthesia problems Neg Hx     Social History Social History   Tobacco Use  . Smoking status: Current Some Day Smoker    Years: 2.00    Types: Cigarettes  . Smokeless tobacco: Never Used  Substance Use Topics  . Alcohol use: Yes    Comment: random  . Drug use: No     Allergies   Patient has no known allergies.   Review of Systems Review of Systems  All other systems reviewed and are negative.    Physical Exam Updated Vital Signs BP 134/80 (BP Location: Left Arm)   Pulse (!) 3   Temp 98.8 F (37.1 C) (Oral)   Resp 16   Ht 5\' 3"  (1.6 m)   Wt 103.9 kg   LMP 12/24/2018 Comment: positive home preg test  SpO2 100%   BMI 40.57 kg/m   Physical Exam Vitals signs and nursing note reviewed.  Constitutional:      General: She is not in acute distress.    Appearance: Normal appearance. She is well-developed. She is not ill-appearing or toxic-appearing.  HENT:     Head:  Normocephalic and atraumatic.     Right Ear: External ear normal.     Left Ear: External ear normal.     Nose: Nose normal. No mucosal edema or rhinorrhea.     Mouth/Throat:     Mouth: Mucous membranes are moist.     Dentition: No dental abscesses.     Pharynx: Oropharynx is clear. No uvula swelling.  Eyes:     Extraocular Movements: Extraocular movements intact.     Conjunctiva/sclera: Conjunctivae normal.     Pupils: Pupils are equal, round, and reactive to light.  Neck:     Musculoskeletal: Full passive range of motion without pain, normal range of motion and neck supple.  Cardiovascular:     Rate and Rhythm: Normal rate and regular rhythm.     Heart sounds: Normal heart sounds. No murmur. No friction rub. No gallop.   Pulmonary:     Effort: Pulmonary effort is normal. No respiratory distress.     Breath sounds: Normal breath sounds. No wheezing, rhonchi or rales.  Chest:     Chest wall: No tenderness or crepitus.  Abdominal:     General: Bowel sounds are normal. There is no distension.     Palpations: Abdomen is soft.     Tenderness: There is abdominal tenderness. There is no guarding or rebound.       Comments: Area of pain noted however she is only tender in the left lower quadrant  Genitourinary:    Comments: Normal external genitalia.  Small amount of white discharge in the vault.  Cervix appears closed.  On bimanual exam she is tender to palpation of the uterus however due to her with her thick abdominal wall I am unable to assess its size.  However she is most tender over the left adnexa without obvious mass. Musculoskeletal: Normal range of motion.        General: No tenderness.     Comments: Moves all extremities well.   Skin:    General: Skin is warm and dry.     Capillary Refill: Capillary refill takes less than 2 seconds.     Coloration: Skin is not pale.     Findings: No erythema or rash.  Neurological:     General: No focal  deficit present.     Mental Status:  She is alert and oriented to person, place, and time.     Cranial Nerves: No cranial nerve deficit.  Psychiatric:        Mood and Affect: Mood normal. Mood is not anxious.        Speech: Speech normal.        Behavior: Behavior normal.      ED Treatments / Results  Labs (all labs ordered are listed, but only abnormal results are displayed) Results for orders placed or performed during the hospital encounter of 02/03/19  Wet prep, genital   Specimen: PATH Cytology Cervicovaginal Ancillary Only  Result Value Ref Range   Yeast Wet Prep HPF POC NONE SEEN NONE SEEN   Trich, Wet Prep NONE SEEN NONE SEEN   Clue Cells Wet Prep HPF POC PRESENT (A) NONE SEEN   WBC, Wet Prep HPF POC MODERATE (A) NONE SEEN   Sperm NONE SEEN   CBC with Differential/Platelet  Result Value Ref Range   WBC 6.6 4.0 - 10.5 K/uL   RBC 4.56 3.87 - 5.11 MIL/uL   Hemoglobin 12.8 12.0 - 15.0 g/dL   HCT 38.2 36.0 - 46.0 %   MCV 83.8 80.0 - 100.0 fL   MCH 28.1 26.0 - 34.0 pg   MCHC 33.5 30.0 - 36.0 g/dL   RDW 13.6 11.5 - 15.5 %   Platelets 334 150 - 400 K/uL   nRBC 0.0 0.0 - 0.2 %   Neutrophils Relative % 54 %   Neutro Abs 3.6 1.7 - 7.7 K/uL   Lymphocytes Relative 34 %   Lymphs Abs 2.2 0.7 - 4.0 K/uL   Monocytes Relative 8 %   Monocytes Absolute 0.6 0.1 - 1.0 K/uL   Eosinophils Relative 3 %   Eosinophils Absolute 0.2 0.0 - 0.5 K/uL   Basophils Relative 1 %   Basophils Absolute 0.0 0.0 - 0.1 K/uL   Immature Granulocytes 0 %   Abs Immature Granulocytes 0.02 0.00 - 0.07 K/uL  I-Stat beta hCG blood, ED  Result Value Ref Range   I-stat hCG, quantitative >2,000.0 (H) <5 mIU/mL   Comment 3           Laboratory interpretation all normal except positive pregnancy test, clue cells   EKG None  Radiology No results found.  Procedures Procedures (including critical care time)  Medications Ordered in ED Medications - No data to display   Initial Impression / Assessment and Plan / ED Course  I have  reviewed the triage vital signs and the nursing notes.  Pertinent labs & imaging results that were available during my care of the patient were reviewed by me and considered in my medical decision making (see chart for details).       When I review patient's chart she was A positive in 2014.  She does not know what RhoGam is.  After her pelvic exam ultrasound pelvis was ordered to evaluate whether she has intrauterine pregnancy.  Her beta hCG is greater than 2000 so she should have a visualized intrauterine pregnancy if present.  Patient left at change of shift with Dr. Laverta Baltimore to get the results of her ultrasound.  Patient is aware of needing the ultrasound and the reason.  Final Clinical Impressions(s) / ED Diagnoses   Final diagnoses:  Pregnancy  LLQ pain  BV (bacterial vaginosis)    Disposition pending  Rolland Porter, MD, Barbette Or, MD 02/03/19 [phone]

## 2019-02-04 LAB — CERVICOVAGINAL ANCILLARY ONLY
Chlamydia: NEGATIVE
Neisseria Gonorrhea: NEGATIVE

## 2019-02-06 ENCOUNTER — Telehealth: Payer: Self-pay | Admitting: *Deleted

## 2019-02-06 NOTE — Telephone Encounter (Signed)
Patient states she was treated for BV and was told she needed to make a f/u appt.  Informed patient that if she was prescribed medication and is taking it, then she does not need to be seen for that.  Pt states she is scheduled for a pregnancy test visit next week.  Informed she did not need this visit but does need a repeat U/S since the embryo was not visualized in the last u/s.  Pt verbalized understanding and appt made.

## 2019-02-06 NOTE — Telephone Encounter (Signed)
Patient states she was seen in the ER and was diagnosed with BV and was told she needed to f/u.

## 2019-02-13 ENCOUNTER — Other Ambulatory Visit: Payer: Self-pay | Admitting: Obstetrics & Gynecology

## 2019-02-13 DIAGNOSIS — O3680X0 Pregnancy with inconclusive fetal viability, not applicable or unspecified: Secondary | ICD-10-CM

## 2019-02-14 ENCOUNTER — Ambulatory Visit: Payer: Medicaid Other | Admitting: Adult Health

## 2019-02-14 ENCOUNTER — Telehealth: Payer: Self-pay | Admitting: *Deleted

## 2019-02-14 ENCOUNTER — Other Ambulatory Visit: Payer: Self-pay

## 2019-02-14 ENCOUNTER — Ambulatory Visit (INDEPENDENT_AMBULATORY_CARE_PROVIDER_SITE_OTHER): Payer: Medicaid Other

## 2019-02-14 DIAGNOSIS — Z3A01 Less than 8 weeks gestation of pregnancy: Secondary | ICD-10-CM | POA: Diagnosis not present

## 2019-02-14 DIAGNOSIS — O3680X0 Pregnancy with inconclusive fetal viability, not applicable or unspecified: Secondary | ICD-10-CM

## 2019-02-14 MED ORDER — METRONIDAZOLE 0.75 % VA GEL: 1.0000 | Freq: Every day | VAGINAL | 0 refills | Status: DC

## 2019-02-14 NOTE — Progress Notes (Addendum)
[place] 7+5 wks,single IUP w/YS,positive FHT 158 bpm,normal ovaries,CRL 16.12 mm,posterior right subserosal fibroid 1.8 x 1.8 x 1.3 cm

## 2019-02-14 NOTE — Telephone Encounter (Signed)
Pt needs metrogel sent in. She couldn't tolerate the flagyl pills.

## 2019-03-17 ENCOUNTER — Other Ambulatory Visit: Payer: Self-pay | Admitting: Obstetrics & Gynecology

## 2019-03-17 DIAGNOSIS — Z3682 Encounter for antenatal screening for nuchal translucency: Secondary | ICD-10-CM

## 2019-03-20 ENCOUNTER — Ambulatory Visit (INDEPENDENT_AMBULATORY_CARE_PROVIDER_SITE_OTHER): Payer: Medicaid Other | Admitting: Women's Health

## 2019-03-20 ENCOUNTER — Other Ambulatory Visit: Payer: Self-pay

## 2019-03-20 ENCOUNTER — Ambulatory Visit (INDEPENDENT_AMBULATORY_CARE_PROVIDER_SITE_OTHER): Payer: Medicaid Other

## 2019-03-20 ENCOUNTER — Encounter: Payer: Self-pay | Admitting: Women's Health

## 2019-03-20 ENCOUNTER — Ambulatory Visit: Payer: Medicaid Other | Admitting: *Deleted

## 2019-03-20 VITALS — BP 129/75 | HR 90 | Wt 232.0 lb

## 2019-03-20 DIAGNOSIS — Z3A12 12 weeks gestation of pregnancy: Secondary | ICD-10-CM

## 2019-03-20 DIAGNOSIS — Z1379 Encounter for other screening for genetic and chromosomal anomalies: Secondary | ICD-10-CM | POA: Diagnosis not present

## 2019-03-20 DIAGNOSIS — Z832 Family history of diseases of the blood and blood-forming organs and certain disorders involving the immune mechanism: Secondary | ICD-10-CM | POA: Insufficient documentation

## 2019-03-20 DIAGNOSIS — Z113 Encounter for screening for infections with a predominantly sexual mode of transmission: Secondary | ICD-10-CM | POA: Diagnosis not present

## 2019-03-20 DIAGNOSIS — Z363 Encounter for antenatal screening for malformations: Secondary | ICD-10-CM

## 2019-03-20 DIAGNOSIS — Z1389 Encounter for screening for other disorder: Secondary | ICD-10-CM

## 2019-03-20 DIAGNOSIS — Z0283 Encounter for blood-alcohol and blood-drug test: Secondary | ICD-10-CM

## 2019-03-20 DIAGNOSIS — Z36 Encounter for antenatal screening for chromosomal anomalies: Secondary | ICD-10-CM | POA: Diagnosis not present

## 2019-03-20 DIAGNOSIS — D259 Leiomyoma of uterus, unspecified: Secondary | ICD-10-CM | POA: Insufficient documentation

## 2019-03-20 DIAGNOSIS — Z3481 Encounter for supervision of other normal pregnancy, first trimester: Secondary | ICD-10-CM | POA: Diagnosis not present

## 2019-03-20 DIAGNOSIS — Z3682 Encounter for antenatal screening for nuchal translucency: Secondary | ICD-10-CM | POA: Diagnosis not present

## 2019-03-20 DIAGNOSIS — Z130 Encounter for screening for diseases of the blood and blood-forming organs and certain disorders involving the immune mechanism: Secondary | ICD-10-CM | POA: Diagnosis not present

## 2019-03-20 DIAGNOSIS — Z331 Pregnant state, incidental: Secondary | ICD-10-CM

## 2019-03-20 DIAGNOSIS — Z3490 Encounter for supervision of normal pregnancy, unspecified, unspecified trimester: Secondary | ICD-10-CM | POA: Insufficient documentation

## 2019-03-20 DIAGNOSIS — Z1371 Encounter for nonprocreative screening for genetic disease carrier status: Secondary | ICD-10-CM

## 2019-03-20 LAB — POCT URINALYSIS DIPSTICK OB
Blood, UA: NEGATIVE
Glucose, UA: NEGATIVE
Ketones, UA: NEGATIVE
Leukocytes, UA: NEGATIVE
Nitrite, UA: NEGATIVE
POC,PROTEIN,UA: NEGATIVE

## 2019-03-20 MED ORDER — BLOOD PRESSURE MONITOR MISC: 0 refills | Status: DC

## 2019-03-20 NOTE — Progress Notes (Signed)
[place] 12+4 wks,measurements c/w dates,normal ovaries bilat,NB present,NT 1.5 mm,crl 59.98 mm,fhr 157 bpm

## 2019-03-20 NOTE — Patient Instructions (Addendum)
Linda Warner, I greatly value your feedback.  If you receive a survey following your visit with [place] today, we appreciate you taking the time to fill it out.  Thanks, Linda Warner, CNM, [facility]  [place]!!! It is now [place] [facility] ([place], [place] [address]) Entrance located off of [address] parking   Nausea & Vomiting  Have saltine crackers or pretzels by your bed and eat a few bites before you raise your head out of bed in the morning  Eat small frequent meals throughout the day instead of large meals  Drink plenty of fluids throughout the day to stay hydrated, just don't drink a lot of fluids with your meals.  This can make your stomach fill up faster making you feel sick  Do not brush your teeth right after you eat  Products with real ginger are good for nausea, like ginger ale and ginger hard candy Make sure it says made with real ginger!  Sucking on sour candy like lemon heads is also good for nausea  If your prenatal vitamins make you nauseated, take them at night so you will sleep through the nausea  Sea Bands  If you feel like you need medicine for the nausea & vomiting please let us know  If you are unable to keep any fluids or food down please let us know   Constipation  Drink plenty of fluid, preferably water, throughout the day  Eat foods high in fiber such as fruits, vegetables, and grains  Exercise, such as walking, is a good way to keep your bowels regular  Drink warm fluids, especially warm prune juice, or decaf coffee  Eat a 1/2 cup of real oatmeal (not instant), 1/2 cup applesauce, and 1/2-1 cup warm prune juice every day  If needed, you may take Colace (docusate sodium) stool softener once or twice a day to help keep the stool soft.   If you still are having problems with constipation, you may take Miralax once daily as needed to help keep your bowels regular.   Home Blood  Pressure Monitoring for Patients   Your provider has recommended that you check your blood pressure (BP) at least once a week at home. If you do not have a blood pressure cuff at home, one will be provided for you. Contact your provider if you have not received your monitor within 1 week.   Helpful Tips for Accurate Home Blood Pressure Checks   Don't smoke, exercise, or drink caffeine 30 minutes before checking your BP  Use the restroom before checking your BP (a full bladder can raise your pressure)  Relax in a comfortable upright chair  Feet on the ground  Left arm resting comfortably on a flat surface at the level of your heart  Legs uncrossed  Back supported  Sit quietly and don't talk  Place the cuff on your bare arm  Adjust snuggly, so that only two fingertips can fit between your skin and the top of the cuff  Check 2 readings separated by at least one minute  Keep a log of your BP readings  For a visual, please reference this diagram: http://ccnc.care/bpdiagram  Provider Name: [facility]     Phone: [phone]  Zone 1: ALL CLEAR  Continue to monitor your symptoms:   BP reading is less than 140 (top number) or less than 90 (bottom number)   No right upper stomach  pain  No headaches or seeing spots  No feeling nauseated or throwing up  No swelling in face and hands  Zone 2: CAUTION Call your doctor's office for any of the following:   BP reading is greater than 140 (top number) or greater than 90 (bottom number)   Stomach pain under your ribs in the middle or right side  Headaches or seeing spots  Feeling nauseated or throwing up  Swelling in face and hands  Zone 3: EMERGENCY  Seek immediate medical care if you have any of the following:   BP reading is greater than160 (top number) or greater than 110 (bottom number)  Severe headaches not improving with Tylenol  Serious difficulty catching your breath  Any worsening symptoms from Zone  2    First Trimester of Pregnancy The first trimester of pregnancy is from week 1 until the end of week 12 (months 1 through 3). A week after a sperm fertilizes an egg, the egg will implant on the wall of the uterus. This embryo will begin to develop into a baby. Genes from you and your partner are forming the baby. The female genes determine whether the baby is a boy or a girl. At 6-8 weeks, the eyes and face are formed, and the heartbeat can be seen on ultrasound. At the end of 12 weeks, all the baby's organs are formed.  Now that you are pregnant, you will want to do everything you can to have a healthy baby. Two of the most important things are to get good prenatal care and to follow your health care provider's instructions. Prenatal care is all the medical care you receive before the baby's birth. This care will help prevent, find, and treat any problems during the pregnancy and childbirth. BODY CHANGES Your body goes through many changes during pregnancy. The changes vary from woman to woman.   You may gain or lose a couple of pounds at first.  You may feel sick to your stomach (nauseous) and throw up (vomit). If the vomiting is uncontrollable, call your health care provider.  You may tire easily.  You may develop headaches that can be relieved by medicines approved by your health care provider.  You may urinate more often. Painful urination may mean you have a bladder infection.  You may develop heartburn as a result of your pregnancy.  You may develop constipation because certain hormones are causing the muscles that push waste through your intestines to slow down.  You may develop hemorrhoids or swollen, bulging veins (varicose veins).  Your breasts may begin to grow larger and become tender. Your nipples may stick out more, and the tissue that surrounds them (areola) may become darker.  Your gums may bleed and may be sensitive to brushing and flossing.  Dark spots or blotches  (chloasma, mask of pregnancy) may develop on your face. This will likely fade after the baby is born.  Your menstrual periods will stop.  You may have a loss of appetite.  You may develop cravings for certain kinds of food.  You may have changes in your emotions from day to day, such as being excited to be pregnant or being concerned that something may go wrong with the pregnancy and baby.  You may have more vivid and strange dreams.  You may have changes in your hair. These can include thickening of your hair, rapid growth, and changes in texture. Some women also have hair loss during or after pregnancy, or hair that  feels dry or thin. Your hair will most likely return to normal after your baby is born. WHAT TO EXPECT AT YOUR PRENATAL VISITS During a routine prenatal visit:  You will be weighed to make sure you and the baby are growing normally.  Your blood pressure will be taken.  Your abdomen will be measured to track your baby's growth.  The fetal heartbeat will be listened to starting around week 10 or 12 of your pregnancy.  Test results from any previous visits will be discussed. Your health care provider may ask you:  How you are feeling.  If you are feeling the baby move.  If you have had any abnormal symptoms, such as leaking fluid, bleeding, severe headaches, or abdominal cramping.  If you have any questions. Other tests that may be performed during your first trimester include:  Blood tests to find your blood type and to check for the presence of any previous infections. They will also be used to check for low iron levels (anemia) and Rh antibodies. Later in the pregnancy, blood tests for diabetes will be done along with other tests if problems develop.  Urine tests to check for infections, diabetes, or protein in the urine.  An ultrasound to confirm the proper growth and development of the baby.  An amniocentesis to check for possible genetic problems.  Fetal  screens for spina bifida and Down syndrome.  You may need other tests to make sure you and the baby are doing well. HOME CARE INSTRUCTIONS  Medicines  Follow your health care provider's instructions regarding medicine use. Specific medicines may be either safe or unsafe to take during pregnancy.  Take your prenatal vitamins as directed.  If you develop constipation, try taking a stool softener if your health care provider approves. Diet  Eat regular, well-balanced meals. Choose a variety of foods, such as meat or vegetable-based protein, fish, milk and low-fat dairy products, vegetables, fruits, and whole grain breads and cereals. Your health care provider will help you determine the amount of weight gain that is right for you.  Avoid raw meat and uncooked cheese. These carry germs that can cause birth defects in the baby.  Eating four or five small meals rather than three large meals a day may help relieve nausea and vomiting. If you start to feel nauseous, eating a few soda crackers can be helpful. Drinking liquids between meals instead of during meals also seems to help nausea and vomiting.  If you develop constipation, eat more high-fiber foods, such as fresh vegetables or fruit and whole grains. Drink enough fluids to keep your urine clear or pale yellow. Activity and Exercise  Exercise only as directed by your health care provider. Exercising will help you:  Control your weight.  Stay in shape.  Be prepared for labor and delivery.  Experiencing pain or cramping in the lower abdomen or low back is a good sign that you should stop exercising. Check with your health care provider before continuing normal exercises.  Try to avoid standing for long periods of time. Move your legs often if you must stand in one place for a long time.  Avoid heavy lifting.  Wear low-heeled shoes, and practice good posture.  You may continue to have sex unless your health care provider directs you  otherwise. Relief of Pain or Discomfort  Wear a good support bra for breast tenderness.    Take warm sitz baths to soothe any pain or discomfort caused by hemorrhoids. Use hemorrhoid cream  if your health care provider approves.    Rest with your legs elevated if you have leg cramps or low back pain.  If you develop varicose veins in your legs, wear support hose. Elevate your feet for 15 minutes, 3-4 times a day. Limit salt in your diet. Prenatal Care  Schedule your prenatal visits by the twelfth week of pregnancy. They are usually scheduled monthly at first, then more often in the last 2 months before delivery.  Write down your questions. Take them to your prenatal visits.  Keep all your prenatal visits as directed by your health care provider. Safety  Wear your seat belt at all times when driving.  Make a list of emergency phone numbers, including numbers for family, friends, the hospital, and police and fire departments. General Tips  Ask your health care provider for a referral to a local prenatal education class. Begin classes no later than at the beginning of month 6 of your pregnancy.  Ask for help if you have counseling or nutritional needs during pregnancy. Your health care provider can offer advice or refer you to specialists for help with various needs.  Do not use hot tubs, steam rooms, or saunas.  Do not douche or use tampons or scented sanitary pads.  Do not cross your legs for long periods of time.  Avoid cat litter boxes and soil used by cats. These carry germs that can cause birth defects in the baby and possibly loss of the fetus by miscarriage or stillbirth.  Avoid all smoking, herbs, alcohol, and medicines not prescribed by your health care provider. Chemicals in these affect the formation and growth of the baby.  Schedule a dentist appointment. At home, brush your teeth with a soft toothbrush and be gentle when you floss. SEEK MEDICAL CARE IF:   You have  dizziness.  You have mild pelvic cramps, pelvic pressure, or nagging pain in the abdominal area.  You have persistent nausea, vomiting, or diarrhea.  You have a bad smelling vaginal discharge.  You have pain with urination.  You notice increased swelling in your face, hands, legs, or ankles. SEEK IMMEDIATE MEDICAL CARE IF:   You have a fever.  You are leaking fluid from your vagina.  You have spotting or bleeding from your vagina.  You have severe abdominal cramping or pain.  You have rapid weight gain or loss.  You vomit blood or material that looks like coffee grounds.  You are exposed to [place] measles and have never had them.  You are exposed to fifth disease or chickenpox.  You develop a severe headache.  You have shortness of breath.  You have any kind of trauma, such as from a fall or a car accident. Document Released: 04/28/2001 Document Revised: 09/18/2013 Document Reviewed: 03/14/2013 [facility] Patient Information 2015 [place], [place]. This information is not intended to replace advice given to you by your health care provider. Make sure you discuss any questions you have with your health care provider.  Coronavirus (COVID-19) Are you at risk?  Are you at risk for the Coronavirus (COVID-19)?  To be considered HIGH RISK for Coronavirus (COVID-19), you have to meet the following criteria:   Traveled to [place], [place], [place], [place] or [place]; or in the [place] to [place], [place], [place], or [place]; and have fever, cough, and shortness of breath within the last 2 weeks of travel [place] in close contact with a person diagnosed with COVID-19 within the last 2  weeks and have fever, cough, and shortness of breath  IF YOU DO NOT MEET THESE CRITERIA, YOU ARE CONSIDERED LOW RISK FOR COVID-19.  What to do if you are HIGH RISK for COVID-19?   If you are having a medical emergency, call 911.  Seek medical care right away. Before you go to a  doctors office, urgent care or emergency department, call ahead and tell them about your recent travel, contact with someone diagnosed with COVID-19, and your symptoms. You should receive instructions from your physicians office regarding next steps of care.   When you arrive at healthcare provider, tell the healthcare staff immediately you have returned from visiting [place], [place], [place], [place] or [place]; or traveled in the [place] to [place], [place], [place], or [place]; in the last two weeks or you have been in close contact with a person diagnosed with COVID-19 in the last 2 weeks.    Tell the health care staff about your symptoms: fever, cough and shortness of breath.  After you have been seen by a medical provider, you will be either: o Tested for (COVID-19) and discharged home on quarantine except to seek medical care if symptoms worsen, and asked to  - Stay home and avoid contact with others until you get your results (4-5 days)  - Avoid travel on public transportation if possible (such as bus, train, or airplane) or o Sent to the Emergency Department by EMS for evaluation, COVID-19 testing, and possible admission depending on your condition and test results.  What to do if you are LOW RISK for COVID-19?  Reduce your risk of any infection by using the same precautions used for avoiding the common cold or flu:   Wash your hands often with soap and warm water for at least 20 seconds.  If soap and water are not readily available, use an alcohol-based hand sanitizer with at least 60% alcohol.   If coughing or sneezing, cover your mouth and nose by coughing or sneezing into the elbow areas of your shirt or coat, into a tissue or into your sleeve (not your hands).  Avoid shaking hands with others and consider head nods or verbal greetings only.  Avoid touching your eyes, nose, or mouth with unwashed hands.   Avoid close contact with people who are sick.  Avoid  places or events with large numbers of people in one location, like concerts or sporting events.  Carefully consider travel plans you have or are making.  If you are planning any travel outside or inside the [place], visit the [place] webpage for the latest health notices.  If you have some symptoms but not all symptoms, continue to monitor at home and seek medical attention if your symptoms worsen.  If you are having a medical emergency, call 911.   [place] / e-Visit: [email or web address]         [facility]: [phone]  [facility]: [id number]                   [facility]: [id number]     PROTECT YOURSELF & YOUR BABY FROM THE FLU! Because you are pregnant, we at [facility], along with the Centers for Disease Control (CDC), recommend that you receive the flu vaccine to protect yourself and your baby from the flu. The flu is more likely to cause severe illness in pregnant women than in women of  reproductive age who are not pregnant. Changes in the immune system, heart, and lungs during pregnancy make pregnant women (and women up to two weeks postpartum) more prone to severe illness from flu, including illness resulting in hospitalization. Flu also may be harmful for a pregnant womans developing baby. A common flu symptom is fever, which may be associated with neural tube defects and other adverse outcomes for a developing baby. Getting vaccinated can also help protect a baby after birth from flu. (Mom passes antibodies onto the developing baby during her pregnancy.)  A Flu Vaccine is the Best Protection Against Flu Getting a flu vaccine is the first and most important step in protecting against flu. Pregnant women should get a flu shot and not the live attenuated influenza vaccine (LAIV), also known as nasal spray flu vaccine. Flu  vaccines given during pregnancy help protect both the mother and her baby from flu. Vaccination has been shown to reduce the risk of flu-associated acute respiratory infection in pregnant women by up to one-half. A 2018 study showed that getting a flu shot reduced a pregnant womans risk of being hospitalized with flu by an average of 40 percent. Pregnant women who get a flu vaccine are also helping to protect their babies from flu illness for the first several months after their birth, when they are too young to get vaccinated.   A Long Record of Safety for Flu Shots in Pregnant Women Flu shots have been given to millions of pregnant women over many years with a good safety record. There is a lot of evidence that flu vaccines can be given safely during pregnancy; though these data are limited for the first trimester. The CDC recommends that pregnant women get vaccinated during any trimester of their pregnancy. It is very important for pregnant women to get the flu shot.   Other Preventive Actions In addition to getting a flu shot, pregnant women should take the same everyday preventive actions the CDC recommends of everyone, including covering coughs, washing hands often, and avoiding people who are sick.  Symptoms and Treatment If you get sick with flu symptoms call your doctor right away. There are antiviral drugs that can treat flu illness and prevent serious flu complications. The CDC recommends prompt treatment for people who have influenza infection or suspected influenza infection and who are at high risk of serious flu complications, such as people with asthma, diabetes (including gestational diabetes), or heart disease. Early treatment of influenza in hospitalized pregnant women has been shown to reduce the length of the hospital stay.  Symptoms Flu symptoms include fever, cough, sore throat, runny or stuffy nose, body aches, headache, chills and fatigue. Some people may also have vomiting and  diarrhea. People may be infected with the flu and have respiratory symptoms without a fever.  Early Treatment is Important for Pregnant Women Treatment should begin as soon as possible because antiviral drugs work best when started early (within 48 hours after symptoms start). Antiviral drugs can make your flu illness milder and make you feel better faster. They may also prevent serious health problems that can result from flu illness. Oral oseltamivir (Tamiflu) is the preferred treatment for pregnant women because it has the most studies available to suggest that it is safe and beneficial. Antiviral drugs require a prescription from your provider. Having a fever caused by flu infection or other infections early in pregnancy may be linked to birth defects in a baby. In addition to taking antiviral drugs, pregnant women  who get a fever should treat their fever with Tylenol (acetaminophen) and contact their provider immediately.  When to [place] If you are pregnant and have any of these signs, seek care immediately:  Difficulty breathing or shortness of breath  Pain or pressure in the chest or abdomen  Sudden dizziness  Confusion  Severe or persistent vomiting  High fever that is not responding to Tylenol (or store brand equivalent)  Decreased or no movement of your baby  [email or web address]

## 2019-03-20 NOTE — Progress Notes (Signed)
INITIAL OBSTETRICAL VISIT Patient name: Linda Warner MRN [id number]  Date of birth: 11-14-1990 Chief Complaint:   Initial Prenatal Visit (nt/it, SOB with walking)  History of Present Illness:   Linda Warner is a 28 y.o. G106P1011 African American female at [redacted]w[redacted]d by LMP c/w 8wk u/s, with an Estimated Date of Delivery: 09/28/19 being seen today for her initial obstetrical visit.   Her obstetrical history is significant for SAB x 1, term uncomplicated SVB x1- son has Babb disease.  Today she reports n/v, not daily, declines meds. Bilateral hip pain when sleeping on sides. SOB w/ walking. No chest pain, lightheadedness, or other sx. Just quit smoking w/ +PT.  Patient's last menstrual period was 12/22/2018 (approximate). Last pap 04/11/18. Results were: normal Review of Systems:   Pertinent items are noted in HPI Denies cramping/contractions, leakage of fluid, vaginal bleeding, abnormal vaginal discharge w/ itching/odor/irritation, headaches, visual changes, shortness of breath, chest pain, abdominal pain, severe nausea/vomiting, or problems with urination or bowel movements unless otherwise stated above.  Pertinent History Reviewed:  Reviewed past medical,surgical, social, obstetrical and family history.  Reviewed problem list, medications and allergies. OB History  Gravida Para Term Preterm AB Living  3 1 1   1 1   SAB TAB Ectopic Multiple Live Births  1       1    # Outcome Date GA Lbr Len/2nd Weight Sex Delivery Anes PTL Lv  3 Current           2 Term 01/07/13 [redacted]w[redacted]d 21:01 / 02:49 7 lb 5.6 oz (3.335 kg) M Vag-Spont EPI N LIV     Birth Comments: 14 w 6 d, NVD, IOL due to non reactive NST. Loose nuchal cord. Mom 20y/o G2P1, GBS positive, treated. ROM 6.5 hrs.  Elevated DSR 1:110 with first trimester screen. No Harmony testing done. Subchorionic hematoma. Former smoker. Mom A+/ Baby ?, Bili 8.9 @ 45 hrs   1 SAB 09/27/11 [redacted]w[redacted]d            Birth Comments: "passed naturally.  they said the  fetus stopped growing at 8 wks."   Physical Assessment:   Vitals:   03/20/19 1041  BP: 129/75  Pulse: 90  Weight: 232 lb (105.2 kg)  Body mass index is 41.1 kg/m.  Pulse ox would not read through artificial nails, good cap refill       Physical Examination:  General appearance - well appearing, and in no distress  Mental status - alert, oriented to person, place, and time  Psych:  She has a normal mood and affect  Skin - warm and dry, normal color, no suspicious lesions noted  Chest - effort normal, all lung fields clear to auscultation bilaterally  Heart - normal rate and regular rhythm  Abdomen - soft, nontender  Extremities:  No swelling or varicosities noted  Pelvic - VULVA: normal appearing vulva with no masses, tenderness or lesions  VAGINA: normal appearing vagina with normal color and discharge, no lesions  CERVIX: normal appearing cervix without discharge or lesions, no CMT  Thin prep pap is not done   TODAY'S NT [place] 12+4 wks,measurements c/w dates,normal ovaries bilat,NB present,NT 1.5 mm,crl 59.98 mm,fhr 157 bpm,right subserosal fibroid 2.2 x 2 x 1.9 cm  No results found for this or any previous visit (from the past 24 hour(s)).  Assessment & Plan:  1) Low-Risk Pregnancy G3P1011 at [redacted]w[redacted]d with an Estimated Date of Delivery: 09/28/19   2) Initial OB visit  3) Son w/  sickle cell disease> so obviously pt is carrier, however neg result abstracted in 2014, will draw today  4) SOB> no other sx, not tachycardic, lungs clear. Likely r/t physiological changes of pregnancy and recently quit smoking, if worsens/develops new sx, let us know/go to ED  Meds:  Meds ordered this encounter  Medications  . Blood Pressure Monitor MISC    Sig: For regular home bp monitoring during pregnancy    Dispense:  1 each    Refill:  0    Needs large cuff Z34.90    Initial labs obtained Continue prenatal vitamins Reviewed n/v relief measures and warning s/s to report Reviewed recommended  weight gain based on pre-gravid BMI Encouraged well-balanced diet Genetic Screening discussed: requested nt/it, maternit21 Cystic fibrosis, SMA, Fragile X screening discussed requested Ultrasound discussed; fetal survey: requested CCNC completed>PCM not here, form faxed The nature of [place] for [employer] with multiple MDs and other Advanced Practice Providers was explained to patient; also emphasized that fellows, residents, and students are part of our team. Does not have home bp cuff. Rx faxed to [facility]. Check bp weekly, let us know if >140/90.  Declined flu shot. She was advised that the flu shot is recommended during pregnancy to help protect her and her baby. We discussed that it is an inactivated vaccine-so side effects are minimal, it is considered safe to receive during any trimester, and pregnant women are at a higher risk of developing potential complications from the flu, including death. She was given printed information from the CDC regarding the flu shot and the flu.    Follow-up: Return in about 6 weeks (around 05/01/2019) for LROB, 2nd IT, [id number], in person, CNM.   Orders Placed This Encounter  Procedures  . GC/Chlamydia Probe Amp  . Urine Culture  . US OB Comp + 14 Wk  . Urinalysis, Routine w reflex microscopic  . Pain Management Screening Profile (10S)  . Obstetric Panel, Including HIV  . Integrated 1  . MaterniT 21 plus Core, Blood  . Inheritest Core(CF97,SMA,FraX)  . POC Urinalysis Dipstick OB    Roma Schanz CNM, [facility] 03/20/2019 10:57 AM

## 2019-03-21 LAB — OBSTETRIC PANEL, INCLUDING HIV
Antibody Screen: NEGATIVE
Basophils Absolute: 0 10*3/uL (ref 0.0–0.2)
Basos: 1 %
EOS (ABSOLUTE): 0.2 10*3/uL (ref 0.0–0.4)
Eos: 3 %
HIV Screen 4th Generation wRfx: NONREACTIVE
Hematocrit: 34.9 % (ref 34.0–46.6)
Hemoglobin: 12 g/dL (ref 11.1–15.9)
Hepatitis B Surface Ag: NEGATIVE
Immature Grans (Abs): 0 10*3/uL (ref 0.0–0.1)
Immature Granulocytes: 0 %
Lymphocytes Absolute: 2 10*3/uL (ref 0.7–3.1)
Lymphs: 28 %
MCH: 28.5 pg (ref 26.6–33.0)
MCHC: 34.4 g/dL (ref 31.5–35.7)
MCV: 83 fL (ref 79–97)
Monocytes Absolute: 0.5 10*3/uL (ref 0.1–0.9)
Monocytes: 7 %
Neutrophils Absolute: 4.3 10*3/uL (ref 1.4–7.0)
Neutrophils: 61 %
Platelets: 343 10*3/uL (ref 150–450)
RBC: 4.21 x10E6/uL (ref 3.77–5.28)
RDW: 13.8 % (ref 11.7–15.4)
RPR Ser Ql: NONREACTIVE
Rh Factor: POSITIVE
Rubella Antibodies, IGG: 4.67 index (ref >0.99)
WBC: 7 10*3/uL (ref 3.4–10.8)

## 2019-03-21 LAB — SICKLE CELL SCREEN: Sickle Cell Screen: NEGATIVE

## 2019-03-22 LAB — GC/CHLAMYDIA PROBE AMP
Chlamydia trachomatis, NAA: NEGATIVE
Neisseria Gonorrhoeae by PCR: NEGATIVE

## 2019-03-22 LAB — SPECIMEN STATUS REPORT

## 2019-03-26 LAB — MATERNIT 21 PLUS CORE, BLOOD
Fetal Fraction: 6
Result (T21): NEGATIVE
Trisomy 13 (Patau syndrome): NEGATIVE
Trisomy 18 (Edwards syndrome): NEGATIVE
Trisomy 21 (Down syndrome): NEGATIVE

## 2019-03-26 LAB — INTEGRATED 1
Crown Rump Length: 60 mm
Gest. Age on Collection Date: 12.3 weeks
Maternal Age at EDD: 27.7 yr
Nuchal Translucency (NT): 1.5 mm
Number of Fetuses: 1
PAPP-A Value: 621.8 ng/mL
Weight: 232 [lb_av]

## 2019-03-27 DIAGNOSIS — Z3481 Encounter for supervision of other normal pregnancy, first trimester: Secondary | ICD-10-CM | POA: Diagnosis not present

## 2019-03-31 LAB — INHERITEST CORE(CF97,SMA,FRAX)

## 2019-04-03 ENCOUNTER — Other Ambulatory Visit: Payer: Self-pay

## 2019-04-03 ENCOUNTER — Encounter: Payer: Self-pay | Admitting: Women's Health

## 2019-04-03 ENCOUNTER — Encounter: Payer: Self-pay | Admitting: Advanced Practice Midwife

## 2019-04-03 ENCOUNTER — Ambulatory Visit (INDEPENDENT_AMBULATORY_CARE_PROVIDER_SITE_OTHER): Payer: Medicaid Other | Admitting: Advanced Practice Midwife

## 2019-04-03 VITALS — BP 134/87 | HR 111 | Wt 234.0 lb

## 2019-04-03 DIAGNOSIS — Z3A14 14 weeks gestation of pregnancy: Secondary | ICD-10-CM

## 2019-04-03 DIAGNOSIS — O285 Abnormal chromosomal and genetic finding on antenatal screening of mother: Secondary | ICD-10-CM | POA: Insufficient documentation

## 2019-04-03 MED ORDER — ONDANSETRON 8 MG PO TBDP: 8.0000 mg | ORAL_TABLET | Freq: Three times a day (TID) | ORAL | 2 refills | Status: DC | PRN

## 2019-04-03 NOTE — Patient Instructions (Signed)
Spinal muscular atrophy  Video visit 11/23 at 2:30. The genetic counselor will text you a link prior to your appointment.

## 2019-04-03 NOTE — Progress Notes (Signed)
   LOW-RISK PREGNANCY VISIT Patient name: Linda Warner MRN [id number]  Date of birth: Nov 26, 1990 Chief Complaint:   Routine Prenatal Visit (nausea, discuss +SMA)  History of Present Illness:   Linda Warner is a 28 y.o. G25P1011 female at [redacted]w[redacted]d with an Estimated Date of Delivery: 09/28/19 being seen today for ongoing management of a low-risk pregnancy.  Today she reports vomiting about and hour after ead. ch meal. Worried about SMA carrier status. FOB wants to get teste Contractions: Not present. Vag. Bleeding: None.   . denies leaking of fluid. Review of Systems:   Pertinent items are noted in HPI Denies abnormal vaginal discharge w/ itching/odor/irritation, headaches, visual changes, shortness of breath, chest pain, abdominal pain, severe nausea/vomiting, or problems with urination or bowel movements unless otherwise stated above.  Pertinent History Reviewed:  Medical & Surgical Hx:   Past Medical History:  Diagnosis Date  . Acne   . BV (bacterial vaginosis)   . Miscarriage   . 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 Pregnant   . Scoliosis   . Scoliosis    Past Surgical History:  Procedure Laterality Date  . NO PAST SURGERIES     Family History  Problem Relation Age of Onset  . Depression Father   . Depression Maternal Grandmother   . Diabetes Maternal Grandmother   . Hyperlipidemia Maternal Grandmother   . Hypertension Maternal Grandmother   . Cancer Maternal Grandmother        breast  . Sickle cell anemia Son   . Anesthesia problems Neg Hx     Current Outpatient Medications:  .  Blood Pressure Monitor MISC, For regular home bp monitoring during pregnancy, Disp: 1 each, Rfl: 0 .  Prenatal Vit-Fe Fumarate-FA (PRENATAL VITAMIN PO), Take by mouth., Disp: , Rfl:  .  ondansetron (ZOFRAN ODT) 8 MG disintegrating tablet, Take 1 tablet (8 mg total) by mouth every 8 (eight) hours as needed for nausea or vomiting., Disp: 30 tablet, Rfl: 2 Social History: Reviewed -  reports that  she has quit smoking. Her smoking use included cigarettes. She quit after 2.00 years of use. She has never used smokeless tobacco.  Physical Assessment:   Vitals:   04/03/19 1359  BP: 134/87  Pulse: (!) 111  Weight: 234 lb (106.1 kg)  Body mass index is 41.45 kg/m.        Physical Examination:   General appearance: Well appearing, and in no distress  Mental status: Alert, oriented to person, place, and time  Skin: Warm & dry  Cardiovascular: Normal heart rate noted  Respiratory: Normal respiratory effort, no distress  Abdomen: Soft, gravid, nontender  Pelvic: Cervical exam deferred         Extremities: Edema: None  Fetal Status:          No results found for this or any previous visit (from the past 24 hour(s)).  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  Genetic counseling appt (video) made for 11/23.  Pt may want to reschedule if FOBs test not back yet.     Plan:  Continue routine obstetrical care    Follow-up: Return for As scheduled.  Orders Placed This Encounter  Procedures  . AMB referral to maternal fetal medicine   Christin Fudge CNM 04/03/2019 3:33 PM

## 2019-04-10 ENCOUNTER — Encounter (HOSPITAL_COMMUNITY): Payer: Medicaid Other

## 2019-05-01 ENCOUNTER — Ambulatory Visit (INDEPENDENT_AMBULATORY_CARE_PROVIDER_SITE_OTHER): Payer: Medicaid Other | Admitting: Advanced Practice Midwife

## 2019-05-01 ENCOUNTER — Ambulatory Visit (INDEPENDENT_AMBULATORY_CARE_PROVIDER_SITE_OTHER): Payer: Medicaid Other

## 2019-05-01 ENCOUNTER — Other Ambulatory Visit: Payer: Self-pay

## 2019-05-01 ENCOUNTER — Encounter: Payer: Self-pay | Admitting: Advanced Practice Midwife

## 2019-05-01 VITALS — BP 126/79 | HR 98 | Wt 239.5 lb

## 2019-05-01 DIAGNOSIS — Z3A18 18 weeks gestation of pregnancy: Secondary | ICD-10-CM | POA: Diagnosis not present

## 2019-05-01 DIAGNOSIS — Z331 Pregnant state, incidental: Secondary | ICD-10-CM | POA: Diagnosis not present

## 2019-05-01 DIAGNOSIS — Z1389 Encounter for screening for other disorder: Secondary | ICD-10-CM | POA: Diagnosis not present

## 2019-05-01 DIAGNOSIS — Z363 Encounter for antenatal screening for malformations: Secondary | ICD-10-CM

## 2019-05-01 DIAGNOSIS — Z0283 Encounter for blood-alcohol and blood-drug test: Secondary | ICD-10-CM | POA: Diagnosis not present

## 2019-05-01 DIAGNOSIS — Z1379 Encounter for other screening for genetic and chromosomal anomalies: Secondary | ICD-10-CM | POA: Diagnosis not present

## 2019-05-01 DIAGNOSIS — Z3481 Encounter for supervision of other normal pregnancy, first trimester: Secondary | ICD-10-CM

## 2019-05-01 DIAGNOSIS — Z3482 Encounter for supervision of other normal pregnancy, second trimester: Secondary | ICD-10-CM

## 2019-05-01 DIAGNOSIS — Z3A12 12 weeks gestation of pregnancy: Secondary | ICD-10-CM | POA: Diagnosis not present

## 2019-05-01 LAB — POCT URINALYSIS DIPSTICK OB
Glucose, UA: NEGATIVE
Leukocytes, UA: NEGATIVE
Nitrite, UA: NEGATIVE
POC,PROTEIN,UA: NEGATIVE

## 2019-05-01 NOTE — Patient Instructions (Signed)
For Headaches:   Stay well hydrated, drink enough water so that your urine is clear, sometimes if you are dehydrated you can get headaches  Eat small frequent meals and snacks, sometimes if you are hungry you can get headaches  Sometimes you get headaches during pregnancy from the pregnancy hormones  You can try tylenol (1-2 regular strength 325mg  or 1-2 extra strength 500mg ) as directed on the box. The least amount of medication that works is best.   Cool compresses (cool wet washcloth or ice pack) to area of head that is hurting  You can also try drinking a caffeinated drink to see if this will help  If not helping, try below:  For Prevention of Headaches/Migraines:  CoQ10 100mg  three times daily  Vitamin B2 400mg  daily  Magnesium Oxide 400-600mg  daily  If You Get a Bad Headache/Migraine:  Benadryl 25mg    Magnesium Oxide  1 large Gatorade  2 extra strength Tylenol (1,000mg  total)  1 cup coffee or Coke  If this doesn't help please call us @ [phone]

## 2019-05-01 NOTE — Progress Notes (Signed)
[place] 18+4 wks,breech,cx 3.1 cm,posterior placenta gr 0,normal ovaries bilat,anterior fibroid 2.6 x 2 x 2.4 cm,LVEICF 1.8 mm,fhr 150 bpm,efw 290 g 88%,anatomy complete

## 2019-05-01 NOTE — Progress Notes (Addendum)
   PRENATAL VISIT NOTE  Subjective:  Linda Warner is a 28 y.o. G3P1011 at [redacted]w[redacted]d being seen today for ongoing prenatal care.  She is currently monitored for the following issues for this low-risk pregnancy and has MOOD SWINGS; SCOLIOSIS; Post partum depression; Supervision of normal pregnancy; Uterine fibroid; Family history of sickle cell disease; and Abnormal chromosomal and genetic finding on antenatal screening of mother on their problem list.  Patient reports no complaints today. She is however asking what she can take for the occasional headache. Usually takes a nap for headaches, which helps. Has not taken any medications. Contractions: Not present. Vag. Bleeding: None.  Movement: Present. Denies leaking of fluid.   The following portions of the patient's history were reviewed and updated as appropriate: allergies, current medications, past family history, past medical history, past social history, past surgical history and problem list.   Objective:   Vitals:   05/01/19 1132  BP: 126/79  Pulse: 98  Weight: 108.6 kg    Fetal Status:     Movement: Present      Ultrasound: [place] 18+4 wks,breech,cx 3.1 cm,posterior placenta gr 0,normal ovaries bilat,anterior fibroid 2.6 x 2 x 2.4 cm,LVEICF 1.8 mm,fhr 150 bpm,efw 290 g 88%,anatomy complete  General:  Alert, oriented and cooperative. Patient is in no acute distress.  Skin: Skin is warm and dry. No rash noted.   Cardiovascular: Normal heart rate noted  Respiratory: Normal respiratory effort, no problems with respiration noted  Abdomen: Soft, gravid, appropriate for gestational age.  Pain/Pressure: Absent     Pelvic: Cervical exam deferred        Extremities: Normal range of motion.  Edema: None  Mental Status: Normal mood and affect. Normal behavior. Normal judgment and thought content.   Assessment and Plan:  Pregnancy: G3P1011 at [redacted]w[redacted]d 1. Encounter for supervision of other normal pregnancy in second trimester - Routine prenatal  care and 2nd IT  - Pt doing well. Reports occasional headaches. Pt asking what is safe to take during pregnancy. Encouraged taking Tylenol PRN, ice, and hydration. - Call if headaches worsening.    - POC Urinalysis Dipstick OB  2. Encounter for genetic screening  - INTEGRATED 2  Preterm labor symptoms and general obstetric precautions including but not limited to vaginal bleeding, contractions, leaking of fluid and fetal movement were reviewed in detail with the patient. Please refer to After Visit Summary for other counseling recommendations.   Return in about 4 weeks (around 05/29/2019) for [facility] Mychart visit.   Maryagnes Amos, SNM  I personally saw and evaluated the patient, performing the key elements of the service. I developed and verified the management plan that is described in the resident's/student's note, and I agree with the content with my edits above. Nigel Berthold, CNM 05/01/2019 12:18 PM

## 2019-05-02 LAB — PMP SCREEN PROFILE (10S), URINE
Amphetamine Scrn, Ur: NEGATIVE ng/mL
BARBITURATE SCREEN URINE: NEGATIVE ng/mL
BENZODIAZEPINE SCREEN, URINE: NEGATIVE ng/mL
CANNABINOIDS UR QL SCN: NEGATIVE ng/mL
Cocaine (Metab) Scrn, Ur: NEGATIVE ng/mL
Creatinine(Crt), U: 147 mg/dL (ref 20.0–300.0)
Methadone Screen, Urine: NEGATIVE ng/mL
OXYCODONE+OXYMORPHONE UR QL SCN: NEGATIVE ng/mL
Opiate Scrn, Ur: NEGATIVE ng/mL
Ph of Urine: 7.2 (ref 4.5–8.9)
Phencyclidine Qn, Ur: NEGATIVE ng/mL
Propoxyphene Scrn, Ur: NEGATIVE ng/mL

## 2019-05-02 LAB — URINALYSIS, ROUTINE W REFLEX MICROSCOPIC
Bilirubin, UA: NEGATIVE
Glucose, UA: NEGATIVE
Leukocytes,UA: NEGATIVE
Nitrite, UA: NEGATIVE
Protein,UA: NEGATIVE
RBC, UA: NEGATIVE
Specific Gravity, UA: 1.025 (ref 1.005–1.030)
Urobilinogen, Ur: 0.2 mg/dL (ref 0.2–1.0)
pH, UA: 7 (ref 5.0–7.5)

## 2019-05-03 LAB — INTEGRATED 2
AFP MoM: 1.6
Alpha-Fetoprotein: 51 ng/mL
Crown Rump Length: 60 mm
DIA MoM: 0.85
DIA Value: 108.7 pg/mL
Estriol, Unconjugated: 0.94 ng/mL
Gest. Age on Collection Date: 12.3 weeks
Gestational Age: 18.3 weeks
Maternal Age at EDD: 27.7 yr
Nuchal Translucency (NT): 1.5 mm
Nuchal Translucency MoM: 1.12
Number of Fetuses: 1
PAPP-A MoM: 1.25
PAPP-A Value: 621.8 ng/mL
Test Results:: NEGATIVE
Weight: 232 [lb_av]
Weight: 240 [lb_av]
hCG MoM: 0.52
hCG Value: 9 IU/mL
uE3 MoM: 0.7

## 2019-05-03 LAB — URINE CULTURE

## 2019-05-19 NOTE — L&D Delivery Note (Signed)
OB/GYN Faculty Practice Delivery Note  Linda Warner is a 29 y.o. G3P1011 s/p VD at [redacted]w[redacted]d. She was admitted for PPROM.   ROM: 19h 97m with clear fluid GBS Status: unknown, PCN given > 4hr prior to delivery    Maximum Maternal Temperature: 98.5 F  Labor Progress: . Patient presented to L&D for PPROM. Initial SVE: 1.5/50/_ . She received cytotec x1, foley bulb and then pitocin. She quickly progressed to complete.   Delivery Date/Time: 09/05/19 at 2159  Delivery: Called to room and patient was complete and pushing. Head delivered ROA. Nuchal cord x1 manually reduced after delivery. Shoulder and body delivered in usual fashion. Infant with spontaneous cry, placed on mother's abdomen, dried and stimulated. Cord clamped x 2 after 1-minute delay, and cut by FOB. Cord blood drawn. Placenta delivered spontaneously with gentle cord traction. Fundus firm with massage and Pitocin. Labia, perineum, vagina, and cervix inspected inspected with a first degree perineal that was repaired with 3-0 Monocryl in the standard fashion.  Baby Weight: pending   Placenta: Sent to pathology Complications: None Lacerations: 1st degree perineal, repaired  EBL: 250 mL  Analgesia: Epidural  Infant: APGAR (1 MIN): 8   APGAR (5 MINS): 9   APGAR (10 MINS):     Linda Hensen, DO PGY-1, [facility] 09/05/2019 10:36 PM

## 2019-05-30 ENCOUNTER — Telehealth: Payer: Medicaid Other | Admitting: Advanced Practice Midwife

## 2019-06-02 ENCOUNTER — Encounter: Payer: Self-pay | Admitting: Obstetrics and Gynecology

## 2019-06-02 ENCOUNTER — Telehealth (INDEPENDENT_AMBULATORY_CARE_PROVIDER_SITE_OTHER): Payer: Medicaid Other | Admitting: Obstetrics and Gynecology

## 2019-06-02 VITALS — BP 119/80 | HR 91

## 2019-06-02 DIAGNOSIS — Z3A23 23 weeks gestation of pregnancy: Secondary | ICD-10-CM

## 2019-06-02 DIAGNOSIS — Z832 Family history of diseases of the blood and blood-forming organs and certain disorders involving the immune mechanism: Secondary | ICD-10-CM

## 2019-06-02 DIAGNOSIS — Z3482 Encounter for supervision of other normal pregnancy, second trimester: Secondary | ICD-10-CM

## 2019-06-02 MED ORDER — METRONIDAZOLE 0.75 % VA GEL: 1.0000 | Freq: Every day | VAGINAL | 1 refills | Status: DC

## 2019-06-02 NOTE — Progress Notes (Signed)
Patient ID: Linda Warner, female   DOB: [birth year], 29 y.o.   MRN: [id number]    [place] VIRTUAL VIDEO VISIT ENCOUNTER NOTE  Provider location: [facility]   I connected with Gasper Sells on 06/02/2019 at 12:50 PM EST by MyChart Video Encounter at home and verified that I am speaking with the correct person using two identifiers.   I discussed the limitations, risks, security and privacy concerns of performing an evaluation and management service virtually and the availability of in person appointments. I also discussed with the patient that there may be a patient responsible charge related to this service. The patient expressed understanding and agreed to proceed. Subjective:  Linda Warner is a 29 y.o. G3P1011 at [redacted]w[redacted]d being seen today for ongoing prenatal care.  She is currently monitored for the following issues for this low-risk pregnancy and has MOOD SWINGS; SCOLIOSIS; Post partum depression; Supervision of normal pregnancy; Uterine fibroid; Family history of sickle cell disease; and Abnormal chromosomal and genetic finding on antenatal screening of mother on their problem list.  Patient reports a vaginal odor on and off since before she became pregnant. Her husband has not been able to get tested for the SMA gene because of his work schedule. Her husband has sickle cell trait and her son has [place] disease.   Contractions: Not present. Vag. Bleeding: None.  Movement: Present. Denies any leaking of fluid.   The following portions of the patient's history were reviewed and updated as appropriate: allergies, current medications, past family history, past medical history, past social history, past surgical history and problem list.   Objective:   Vitals:   06/02/19 1253  BP: 119/80  Pulse: 91    Fetal Status:     Movement: Present     General:  Alert, oriented and cooperative. Patient is in no acute distress.  Respiratory:  Normal respiratory effort, no problems with respiration noted  Mental Status: Normal mood and affect. Normal behavior. Normal judgment and thought content.  Rest of physical exam deferred due to type of encounter  Imaging: No results found.  Assessment and Plan:  1. Pregnancy: G3P1011 at [redacted]w[redacted]d  2. 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 Rx Metrogel for vaginal odor, recurrent BV   Preterm labor symptoms and general obstetric precautions including but not limited to vaginal bleeding, contractions, leaking of fluid and fetal movement were reviewed in detail with the patient. I discussed the assessment and treatment plan with the patient. The patient was provided an opportunity to ask questions and all were answered. The patient agreed with the plan and demonstrated an understanding of the instructions. The patient was advised to call back or seek an in-person office evaluation/go to [facility] at [facility] for any urgent or concerning symptoms. Please refer to After Visit Summary for other counseling recommendations.   I provided 10 minutes of face-to-face time during this encounter + documentation.  Return in about 4 weeks (around 06/30/2019) for LROB. and sugar test GTT  No future appointments.  By signing my name below, I, De Burrs, attest that this documentation has been prepared under the direction and in the presence of Jonnie Kind, MD Electronically Signed: De Burrs, Medical Scribe. 06/02/19. 1:10 PM.  I personally performed the services described in this documentation, which was SCRIBED in my presence. The recorded information has been reviewed and considered  accurate. It has been edited as necessary during review. Jonnie Kind, MD

## 2019-06-02 NOTE — Progress Notes (Signed)
Pt has a vaginal odor.

## 2019-06-30 ENCOUNTER — Ambulatory Visit (INDEPENDENT_AMBULATORY_CARE_PROVIDER_SITE_OTHER): Payer: Medicaid Other | Admitting: Obstetrics & Gynecology

## 2019-06-30 ENCOUNTER — Other Ambulatory Visit: Payer: Medicaid Other

## 2019-06-30 ENCOUNTER — Other Ambulatory Visit: Payer: Self-pay

## 2019-06-30 VITALS — BP 124/78 | HR 98 | Wt 245.0 lb

## 2019-06-30 DIAGNOSIS — Z23 Encounter for immunization: Secondary | ICD-10-CM | POA: Diagnosis not present

## 2019-06-30 DIAGNOSIS — Z1389 Encounter for screening for other disorder: Secondary | ICD-10-CM

## 2019-06-30 DIAGNOSIS — Z3480 Encounter for supervision of other normal pregnancy, unspecified trimester: Secondary | ICD-10-CM

## 2019-06-30 DIAGNOSIS — Z331 Pregnant state, incidental: Secondary | ICD-10-CM

## 2019-06-30 DIAGNOSIS — Z3A27 27 weeks gestation of pregnancy: Secondary | ICD-10-CM

## 2019-06-30 DIAGNOSIS — Z3482 Encounter for supervision of other normal pregnancy, second trimester: Secondary | ICD-10-CM

## 2019-06-30 LAB — POCT URINALYSIS DIPSTICK OB
Blood, UA: NEGATIVE
Glucose, UA: NEGATIVE
Ketones, UA: NEGATIVE
Leukocytes, UA: NEGATIVE
Nitrite, UA: NEGATIVE
POC,PROTEIN,UA: NEGATIVE

## 2019-06-30 MED ORDER — METRONIDAZOLE 0.75 % VA GEL: VAGINAL | 3 refills | Status: DC

## 2019-06-30 NOTE — Progress Notes (Signed)
   LOW-RISK PREGNANCY VISIT Patient name: Linda Warner MRN [id number]  Date of birth: 24-Nov-1990 Chief Complaint:   Routine Prenatal Visit  History of Present Illness:   Linda Warner is a 29 y.o. G75P1011 female at [redacted]w[redacted]d with an Estimated Date of Delivery: 09/28/19 being seen today for ongoing management of a low-risk pregnancy.  Today she reports no complaints. Contractions: Not present. Vag. Bleeding: None.  Movement: Present. denies leaking of fluid. Review of Systems:   Pertinent items are noted in HPI Denies abnormal vaginal discharge w/ itching/odor/irritation, headaches, visual changes, shortness of breath, chest pain, abdominal pain, severe nausea/vomiting, or problems with urination or bowel movements unless otherwise stated above. Pertinent History Reviewed:  Reviewed past medical,surgical, social, obstetrical and family history.  Reviewed problem list, medications and allergies. Physical Assessment:   Vitals:   06/30/19 0928  BP: 124/78  Pulse: 98  Weight: 245 lb (111.1 kg)  Body mass index is 43.4 kg/m.        Physical Examination:   General appearance: Well appearing, and in no distress  Mental status: Alert, oriented to person, place, and time  Skin: Warm & dry  Cardiovascular: Normal heart rate noted  Respiratory: Normal respiratory effort, no distress  Abdomen: Soft, gravid, nontender  Pelvic: Cervical exam deferred         Extremities: Edema: None  Fetal Status:   Fundal Height: 31 cm Movement: Present    Chaperone: n/a    Results for orders placed or performed in visit on 06/30/19 (from the past 24 hour(s))  POC Urinalysis Dipstick OB   Collection Time: 06/30/19  9:32 AM  Result Value Ref Range   Color, UA     Clarity, UA     Glucose, UA Negative Negative   Bilirubin, UA     Ketones, UA n    Spec Grav, UA     Blood, UA n    pH, UA     POC,PROTEIN,UA Negative Negative, Trace, Small (1+), Moderate (2+), Large (3+), 4+   Urobilinogen, UA     Nitrite, UA n    Leukocytes, UA Negative Negative   Appearance     Odor      Assessment & Plan:  1) Low-risk pregnancy G3P1011 at [redacted]w[redacted]d with an Estimated Date of Delivery: 09/28/19      Meds:  Meds ordered this encounter  Medications  . metroNIDAZOLE (METROGEL VAGINAL) 0.75 % vaginal gel    Sig: Nightly x 5 nights    Dispense:  70 g    Refill:  3   Labs/procedures today: PN2  Plan:  Continue routine obstetrical care  Next visit: prefers online    Reviewed: Preterm labor symptoms and general obstetric precautions including but not limited to vaginal bleeding, contractions, leaking of fluid and fetal movement were reviewed in detail with the patient.  All questions were answered.  home bp cuff. Rx faxed to . Check bp weekly, let us know if >140/90.   Follow-up: Return in about 3 weeks (around 07/21/2019) for [place] visit, LROB.  Orders Placed This Encounter  Procedures  . Tdap vaccine greater than or equal to 7yo IM  . POC Urinalysis Dipstick OB   Florian Buff  06/30/2019 9:47 AM

## 2019-07-01 LAB — CBC
Hematocrit: 37.4 % (ref 34.0–46.6)
Hemoglobin: 12.4 g/dL (ref 11.1–15.9)
MCH: 28.1 pg (ref 26.6–33.0)
MCHC: 33.2 g/dL (ref 31.5–35.7)
MCV: 85 fL (ref 79–97)
Platelets: 303 10*3/uL (ref 150–450)
RBC: 4.42 x10E6/uL (ref 3.77–5.28)
RDW: 13.2 % (ref 11.7–15.4)
WBC: 7.1 10*3/uL (ref 3.4–10.8)

## 2019-07-01 LAB — RPR: RPR Ser Ql: NONREACTIVE

## 2019-07-01 LAB — HIV ANTIBODY (ROUTINE TESTING W REFLEX): HIV Screen 4th Generation wRfx: NONREACTIVE

## 2019-07-01 LAB — ANTIBODY SCREEN: Antibody Screen: NEGATIVE

## 2019-07-01 LAB — GLUCOSE TOLERANCE, 2 HOURS W/ 1HR
Glucose, 1 hour: 131 mg/dL (ref 65–179)
Glucose, 2 hour: 89 mg/dL (ref 65–152)
Glucose, Fasting: 74 mg/dL (ref 65–91)

## 2019-07-12 ENCOUNTER — Other Ambulatory Visit: Payer: Self-pay | Admitting: Advanced Practice Midwife

## 2019-07-12 MED ORDER — BUTALBITAL-APAP-CAFFEINE 50-325-40 MG PO TABS: 1.0000 | ORAL_TABLET | Freq: Four times a day (QID) | ORAL | 0 refills | Status: DC | PRN

## 2019-07-21 ENCOUNTER — Telehealth (INDEPENDENT_AMBULATORY_CARE_PROVIDER_SITE_OTHER): Payer: Medicaid Other | Admitting: Women's Health

## 2019-07-21 ENCOUNTER — Encounter: Payer: Self-pay | Admitting: Women's Health

## 2019-07-21 VITALS — BP 128/84 | HR 108

## 2019-07-21 DIAGNOSIS — Z3A30 30 weeks gestation of pregnancy: Secondary | ICD-10-CM

## 2019-07-21 DIAGNOSIS — Z3483 Encounter for supervision of other normal pregnancy, third trimester: Secondary | ICD-10-CM

## 2019-07-21 NOTE — Progress Notes (Signed)
   TELEHEALTH VIRTUAL OBSTETRICS VISIT ENCOUNTER NOTE Patient name: Linda Warner MRN [id number]  Date of birth: 1991-01-30  I connected with patient on 07/21/19 at  8:50 AM EST by MyChart video  and verified that I am speaking with the correct person using two identifiers. Due to COVID-19 recommendations, pt is not currently in our office.    I discussed the limitations, risks, security and privacy concerns of performing an evaluation and management service by telephone and the availability of in person appointments. I also discussed with the patient that there may be a patient responsible charge related to this service. The patient expressed understanding and agreed to proceed.  Chief Complaint:   Routine Prenatal Visit  History of Present Illness:   Linda Warner is a 29 y.o. G19P1011 female at [redacted]w[redacted]d with an Estimated Date of Delivery: 09/28/19 being evaluated today for ongoing management of a low-risk pregnancy.  Today she reports hot flashes and occ cramping. Contractions: Not present. Vag. Bleeding: None.  Movement: Present. denies leaking of fluid. Review of Systems:   Pertinent items are noted in HPI Denies abnormal vaginal discharge w/ itching/odor/irritation, headaches, visual changes, shortness of breath, chest pain, abdominal pain, severe nausea/vomiting, or problems with urination or bowel movements unless otherwise stated above. Pertinent History Reviewed:  Reviewed past medical,surgical, social, obstetrical and family history.  Reviewed problem list, medications and allergies. Physical Assessment:   Vitals:   07/21/19 0849  BP: 128/84  Pulse: (!) 108  There is no height or weight on file to calculate BMI.        Physical Examination:   General:  Alert, oriented and cooperative.   Mental Status: Normal mood and affect perceived. Normal judgment and thought content.  Rest of physical exam deferred due to type of encounter  No results found for this or any previous visit  (from the past 24 hour(s)).  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   Labs/procedures today: none  Plan:  Continue routine obstetrical care.  Has home bp cuff.  Check bp weekly, let us know if >140/90.  Next visit: prefers online    Reviewed: Preterm labor symptoms and general obstetric precautions including but not limited to vaginal bleeding, contractions, leaking of fluid and fetal movement were reviewed in detail with the patient. The patient was advised to call back or seek an in-person office evaluation/go to [facility] at [facility] for any urgent or concerning symptoms. All questions were answered. Please refer to After Visit Summary for other counseling recommendations.    I provided 15 minutes of non-face-to-face time during this encounter.  Follow-up: Return in about 2 weeks (around 08/04/2019) for [place], Ashley, CNM.  No orders of the defined types were placed in this encounter.  [place], [facility] 07/21/2019 9:13 AM

## 2019-07-21 NOTE — Patient Instructions (Signed)
Linda Warner, I greatly value your feedback.  If you receive a survey following your visit with [place] today, we appreciate you taking the time to fill it out.  Thanks, Knute Neu, CNM, [facility]  [place]!!! It is now [place] at [facility] ([place], [place] [address]) Entrance located off of [address] parking    Go to [email or web address] to register for FREE online childbirth classes   Call the office [phone]) or go to [facility] if:  You begin to have strong, frequent contractions  Your water breaks.  Sometimes it is a big gush of fluid, sometimes it is just a trickle that keeps getting your panties wet or running down your legs  You have vaginal bleeding.  It is normal to have a small amount of spotting if your cervix was checked.   You don't feel your baby moving like normal.  If you don't, get you something to eat and drink and lay down and focus on feeling your baby move.  You should feel at least 10 movements in 2 hours.  If you don't, you should call the office or go to [facility].    Tdap Vaccine  It is recommended that you get the Tdap vaccine during the third trimester of EACH pregnancy to help protect your baby from getting pertussis (whooping cough)  27-36 weeks is the BEST time to do this so that you can pass the protection on to your baby. During pregnancy is better than after pregnancy, but if you are unable to get it during pregnancy it will be offered at the hospital.   You can get this vaccine with [place], at the health department, your family doctor, or some local pharmacies  Everyone who will be around your baby should also be up-to-date on their vaccines before the baby comes. Adults (who are not pregnant) only need 1 dose of Tdap during adulthood.   [place] Pediatricians/Family Doctors:  [facility] [phone]                  [place] [phone] (usually not accepting new patients unless you have family there already, you are always welcome to call and ask)       [facility] Department [phone]       [facility] Pediatricians/Family Doctors:   [facility]: [phone]  [facility]: [phone]  [facility]: [place] Doctors:   [facility]: [facility] [facility]: [place]:  [place]: [phone]   Home Blood Pressure Monitoring for Patients   Your provider has recommended that you check your blood pressure (BP) at least once a week at home. If you do not have a blood pressure cuff at home, one will be provided for you. Contact your provider if you have not received your monitor within 1 week.   Helpful Tips for Accurate Home Blood Pressure Checks  . Don't smoke, exercise, or drink caffeine 30 minutes before checking your BP . Use the restroom before checking your BP (a full bladder can raise your pressure) . Relax in a comfortable upright chair . Feet on the ground . Left arm resting comfortably on a flat surface at the level of your heart . Legs uncrossed . Back supported . Sit quietly and don't  talk . Place the cuff on your bare arm . Adjust snuggly, so that only two fingertips can fit between your skin and the top of the cuff . Check 2 readings separated by at least one minute . Keep a log of your BP readings . For a visual, please reference this diagram: http://ccnc.care/bpdiagram  Provider Name: [facility]     Phone: [phone]  Zone 1: ALL CLEAR  Continue to monitor your symptoms:  . BP reading is less than 140 (top number) or less than 90 (bottom number)  . No right upper stomach pain . No headaches or seeing spots . No feeling nauseated or throwing up . No swelling in face and  hands  Zone 2: CAUTION Call your doctor's office for any of the following:  . BP reading is greater than 140 (top number) or greater than 90 (bottom number)  . Stomach pain under your ribs in the middle or right side . Headaches or seeing spots . Feeling nauseated or throwing up . Swelling in face and hands  Zone 3: EMERGENCY  Seek immediate medical care if you have any of the following:  . BP reading is greater than160 (top number) or greater than 110 (bottom number) . Severe headaches not improving with Tylenol . Serious difficulty catching your breath . Any worsening symptoms from Zone 2   Third Trimester of Pregnancy The third trimester is from week 29 through week 42, months 7 through 9. The third trimester is a time when the fetus is growing rapidly. At the end of the ninth month, the fetus is about 20 inches in length and weighs 6-10 pounds.  BODY CHANGES Your body goes through many changes during pregnancy. The changes vary from woman to woman.   Your weight will continue to increase. You can expect to gain 25-35 pounds (11-16 kg) by the end of the pregnancy.  You may begin to get stretch marks on your hips, abdomen, and breasts.  You may urinate more often because the fetus is moving lower into your pelvis and pressing on your bladder.  You may develop or continue to have heartburn as a result of your pregnancy.  You may develop constipation because certain hormones are causing the muscles that push waste through your intestines to slow down.  You may develop hemorrhoids or swollen, bulging veins (varicose veins).  You may have pelvic pain because of the weight gain and pregnancy hormones relaxing your joints between the bones in your pelvis. Backaches may result from overexertion of the muscles supporting your posture.  You may have changes in your hair. These can include thickening of your hair, rapid growth, and changes in texture. Some women also have hair loss during  or after pregnancy, or hair that feels dry or thin. Your hair will most likely return to normal after your baby is born.  Your breasts will continue to grow and be tender. A yellow discharge may leak from your breasts called colostrum.  Your belly button may stick out.  You may feel short of breath because of your expanding uterus.  You may notice the fetus "dropping," or moving lower in your abdomen.  You may have a bloody mucus discharge. This usually occurs a few days to a week before labor begins.  Your cervix becomes thin and soft (effaced) near your due date. WHAT TO EXPECT AT YOUR PRENATAL EXAMS  You will have prenatal exams every 2 weeks until week 36. Then, you will have weekly prenatal  exams. During a routine prenatal visit:  You will be weighed to make sure you and the fetus are growing normally.  Your blood pressure is taken.  Your abdomen will be measured to track your baby's growth.  The fetal heartbeat will be listened to.  Any test results from the previous visit will be discussed.  You may have a cervical check near your due date to see if you have effaced. At around 36 weeks, your caregiver will check your cervix. At the same time, your caregiver will also perform a test on the secretions of the vaginal tissue. This test is to determine if a type of bacteria, Group B streptococcus, is present. Your caregiver will explain this further. Your caregiver may ask you:  What your birth plan is.  How you are feeling.  If you are feeling the baby move.  If you have had any abnormal symptoms, such as leaking fluid, bleeding, severe headaches, or abdominal cramping.  If you have any questions. Other tests or screenings that may be performed during your third trimester include:  Blood tests that check for low iron levels (anemia).  Fetal testing to check the health, activity level, and growth of the fetus. Testing is done if you have certain medical conditions or if  there are problems during the pregnancy. FALSE LABOR You may feel small, irregular contractions that eventually go away. These are called Braxton Hicks contractions, or false labor. Contractions may last for hours, days, or even weeks before true labor sets in. If contractions come at regular intervals, intensify, or become painful, it is best to be seen by your caregiver.  SIGNS OF LABOR   Menstrual-like cramps.  Contractions that are 5 minutes apart or less.  Contractions that start on the top of the uterus and spread down to the lower abdomen and back.  A sense of increased pelvic pressure or back pain.  A watery or bloody mucus discharge that comes from the vagina. If you have any of these signs before the 37th week of pregnancy, call your caregiver right away. You need to go to the hospital to get checked immediately. HOME CARE INSTRUCTIONS   Avoid all smoking, herbs, alcohol, and unprescribed drugs. These chemicals affect the formation and growth of the baby.  Follow your caregiver's instructions regarding medicine use. There are medicines that are either safe or unsafe to take during pregnancy.  Exercise only as directed by your caregiver. Experiencing uterine cramps is a good sign to stop exercising.  Continue to eat regular, healthy meals.  Wear a good support bra for breast tenderness.  Do not use hot tubs, steam rooms, or saunas.  Wear your seat belt at all times when driving.  Avoid raw meat, uncooked cheese, cat litter boxes, and soil used by cats. These carry germs that can cause birth defects in the baby.  Take your prenatal vitamins.  Try taking a stool softener (if your caregiver approves) if you develop constipation. Eat more high-fiber foods, such as fresh vegetables or fruit and whole grains. Drink plenty of fluids to keep your urine clear or pale yellow.  Take warm sitz baths to soothe any pain or discomfort caused by hemorrhoids. Use hemorrhoid cream if your  caregiver approves.  If you develop varicose veins, wear support hose. Elevate your feet for 15 minutes, 3-4 times a day. Limit salt in your diet.  Avoid heavy lifting, wear low heal shoes, and practice good posture.  Rest a lot with your legs elevated  if you have leg cramps or low back pain.  Visit your dentist if you have not gone during your pregnancy. Use a soft toothbrush to brush your teeth and be gentle when you floss.  A sexual relationship may be continued unless your caregiver directs you otherwise.  Do not travel far distances unless it is absolutely necessary and only with the approval of your caregiver.  Take prenatal classes to understand, practice, and ask questions about the labor and delivery.  Make a trial run to the hospital.  Pack your hospital bag.  Prepare the baby's nursery.  Continue to go to all your prenatal visits as directed by your caregiver. SEEK MEDICAL CARE IF:  You are unsure if you are in labor or if your water has broken.  You have dizziness.  You have mild pelvic cramps, pelvic pressure, or nagging pain in your abdominal area.  You have persistent nausea, vomiting, or diarrhea.  You have a bad smelling vaginal discharge.  You have pain with urination. SEEK IMMEDIATE MEDICAL CARE IF:   You have a fever.  You are leaking fluid from your vagina.  You have spotting or bleeding from your vagina.  You have severe abdominal cramping or pain.  You have rapid weight loss or gain.  You have shortness of breath with chest pain.  You notice sudden or extreme swelling of your face, hands, ankles, feet, or legs.  You have not felt your baby move in over an hour.  You have severe headaches that do not go away with medicine.  You have vision changes. Document Released: 04/28/2001 Document Revised: 05/09/2013 Document Reviewed: 07/05/2012 [facility] Patient Information 2015 [place], [place]. This information is not intended to replace advice  given to you by your health care provider. Make sure you discuss any questions you have with your health care provider.  PROTECT YOURSELF & YOUR BABY FROM THE FLU! Because you are pregnant, we at [facility], along with the Centers for Disease Control (CDC), recommend that you receive the flu vaccine to protect yourself and your baby from the flu. The flu is more likely to cause severe illness in pregnant women than in women of reproductive age who are not pregnant. Changes in the immune system, heart, and lungs during pregnancy make pregnant women (and women up to two weeks postpartum) more prone to severe illness from flu, including illness resulting in hospitalization. Flu also may be harmful for a pregnant woman's developing baby. A common flu symptom is fever, which may be associated with neural tube defects and other adverse outcomes for a developing baby. Getting vaccinated can also help protect a baby after birth from flu. (Mom passes antibodies onto the developing baby during her pregnancy.)  A Flu Vaccine is the Best Protection Against Flu Getting a flu vaccine is the first and most important step in protecting against flu. Pregnant women should get a flu shot and not the live attenuated influenza vaccine (LAIV), also known as nasal spray flu vaccine. Flu vaccines given during pregnancy help protect both the mother and her baby from flu. Vaccination has been shown to reduce the risk of flu-associated acute respiratory infection in pregnant women by up to one-half. A 2018 study showed that getting a flu shot reduced a pregnant woman's risk of being hospitalized with flu by an average of 40 percent. Pregnant women who get a flu vaccine are also helping to protect their babies from flu illness for the first several months after their birth, when they  are too young to get vaccinated.   A Long Record of Safety for Flu Shots in Pregnant Women Flu shots have been given to millions of pregnant women over  many years with a good safety record. There is a lot of evidence that flu vaccines can be given safely during pregnancy; though these data are limited for the first trimester. The CDC recommends that pregnant women get vaccinated during any trimester of their pregnancy. It is very important for pregnant women to get the flu shot.   Other Preventive Actions In addition to getting a flu shot, pregnant women should take the same everyday preventive actions the CDC recommends of everyone, including covering coughs, washing hands often, and avoiding people who are sick.  Symptoms and Treatment If you get sick with flu symptoms call your doctor right away. There are antiviral drugs that can treat flu illness and prevent serious flu complications. The CDC recommends prompt treatment for people who have influenza infection or suspected influenza infection and who are at high risk of serious flu complications, such as people with asthma, diabetes (including gestational diabetes), or heart disease. Early treatment of influenza in hospitalized pregnant women has been shown to reduce the length of the hospital stay.  Symptoms Flu symptoms include fever, cough, sore throat, runny or stuffy nose, body aches, headache, chills and fatigue. Some people may also have vomiting and diarrhea. People may be infected with the flu and have respiratory symptoms without a fever.  Early Treatment is Important for Pregnant Women Treatment should begin as soon as possible because antiviral drugs work best when started early (within 48 hours after symptoms start). Antiviral drugs can make your flu illness milder and make you feel better faster. They may also prevent serious health problems that can result from flu illness. Oral oseltamivir (Tamiflu) is the preferred treatment for pregnant women because it has the most studies available to suggest that it is safe and beneficial. Antiviral drugs require a prescription from your  provider. Having a fever caused by flu infection or other infections early in pregnancy may be linked to birth defects in a baby. In addition to taking antiviral drugs, pregnant women who get a fever should treat their fever with Tylenol (acetaminophen) and contact their provider immediately.  When to [place] If you are pregnant and have any of these signs, seek care immediately:  Difficulty breathing or shortness of breath  Pain or pressure in the chest or abdomen  Sudden dizziness  Confusion  Severe or persistent vomiting  High fever that is not responding to Tylenol (or store brand equivalent)  Decreased or no movement of your baby  [email or web address]

## 2019-08-04 ENCOUNTER — Telehealth (INDEPENDENT_AMBULATORY_CARE_PROVIDER_SITE_OTHER): Payer: Medicaid Other | Admitting: Women's Health

## 2019-08-04 ENCOUNTER — Encounter: Payer: Self-pay | Admitting: Women's Health

## 2019-08-04 VITALS — BP 132/89 | HR 101

## 2019-08-04 DIAGNOSIS — Z148 Genetic carrier of other disease: Secondary | ICD-10-CM

## 2019-08-04 DIAGNOSIS — Z3483 Encounter for supervision of other normal pregnancy, third trimester: Secondary | ICD-10-CM

## 2019-08-04 DIAGNOSIS — Z3A32 32 weeks gestation of pregnancy: Secondary | ICD-10-CM

## 2019-08-04 NOTE — Patient Instructions (Signed)
Gasper Sells, I greatly value your feedback.  If you receive a survey following your visit with [place] today, we appreciate you taking the time to fill it out.  Thanks, Knute Neu, CNM, [facility]  [place]!!! It is now [place] at [facility] ([place], [place] [address]) Entrance located off of [address] parking   Go to [email or web address] to register for FREE online childbirth classes    Call the office [phone]) or go to [facility] if:  You begin to have strong, frequent contractions  Your water breaks.  Sometimes it is a big gush of fluid, sometimes it is just a trickle that keeps getting your panties wet or running down your legs  You have vaginal bleeding.  It is normal to have a small amount of spotting if your cervix was checked.   You don't feel your baby moving like normal.  If you don't, get you something to eat and drink and lay down and focus on feeling your baby move.  You should feel at least 10 movements in 2 hours.  If you don't, you should call the office or go to [facility].   Call the office [phone]) or go to [facility] hospital for these signs of pre-eclampsia:  Severe headache that does not go away with Tylenol  Visual changes- seeing spots, double, blurred vision  Pain under your right breast or upper abdomen that does not go away with Tums or heartburn medicine  Nausea and/or vomiting  Severe swelling in your hands, feet, and face      Home Blood Pressure Monitoring for Patients   Your provider has recommended that you check your blood pressure (BP) at least once a week at home. If you do not have a blood pressure cuff at home, one will be provided for you. Contact your provider if you have not received your monitor within 1 week.   Helpful Tips for Accurate Home Blood Pressure Checks  . Don't smoke, exercise, or drink caffeine 30 minutes before checking your BP . Use the  restroom before checking your BP (a full bladder can raise your pressure) . Relax in a comfortable upright chair . Feet on the ground . Left arm resting comfortably on a flat surface at the level of your heart . Legs uncrossed . Back supported . Sit quietly and don't talk . Place the cuff on your bare arm . Adjust snuggly, so that only two fingertips can fit between your skin and the top of the cuff . Check 2 readings separated by at least one minute . Keep a log of your BP readings . For a visual, please reference this diagram: http://ccnc.care/bpdiagram  Provider Name: [facility]     Phone: [phone]  Zone 1: ALL CLEAR  Continue to monitor your symptoms:  . BP reading is less than 140 (top number) or less than 90 (bottom number)  . No right upper stomach pain . No headaches or seeing spots . No feeling nauseated or throwing up . No swelling in face and hands  Zone 2: CAUTION Call your doctor's office for any of the following:  . BP reading is greater than 140 (top number) or greater than 90 (bottom number)  . Stomach pain under your ribs in the middle or right side . Headaches or seeing spots . Feeling nauseated or throwing up . Swelling in face and hands  Zone 3: EMERGENCY  Seek  immediate medical care if you have any of the following:  . BP reading is greater than160 (top number) or greater than 110 (bottom number) . Severe headaches not improving with Tylenol . Serious difficulty catching your breath . Any worsening symptoms from Zone 2

## 2019-08-04 NOTE — Progress Notes (Signed)
   TELEHEALTH VIRTUAL OBSTETRICS VISIT ENCOUNTER NOTE Patient name: Linda Warner MRN [id number]  Date of birth: 1990/07/11  I connected with patient on 08/04/19 at  9:10 AM EDT by MyChart video  and verified that I am speaking with the correct person using two identifiers. Due to COVID-19 recommendations, pt is not currently in our office.    I discussed the limitations, risks, security and privacy concerns of performing an evaluation and management service by telephone and the availability of in person appointments. I also discussed with the patient that there may be a patient responsible charge related to this service. The patient expressed understanding and agreed to proceed.  Chief Complaint:   Routine Prenatal Visit  History of Present Illness:   Linda Warner is a 29 y.o. G38P1011 female at [redacted]w[redacted]d with an Estimated Date of Delivery: 09/28/19 being evaluated today for ongoing management of a low-risk pregnancy.  Today she reports doing well, occ hot flashes. FOB declined SMA testing. BP high-normal, Denies ha, visual changes, ruq/epigastric pain, n/v. Did have bad headache few weeks ago r/b fioricet. No h/o GHTN/pre-e.  Contractions: Not present. Vag. Bleeding: None.  Movement: Present. denies leaking of fluid. Review of Systems:   Pertinent items are noted in HPI Denies abnormal vaginal discharge w/ itching/odor/irritation, headaches, visual changes, shortness of breath, chest pain, abdominal pain, severe nausea/vomiting, or problems with urination or bowel movements unless otherwise stated above. Pertinent History Reviewed:  Reviewed past medical,surgical, social, obstetrical and family history.  Reviewed problem list, medications and allergies. Physical Assessment:   Vitals:   08/04/19 0913  BP: 132/89  Pulse: (!) 101  There is no height or weight on file to calculate BMI.        Physical Examination:   General:  Alert, oriented and cooperative.   Mental Status: Normal mood and  affect perceived. Normal judgment and thought content.  Rest of physical exam deferred due to type of encounter  No results found for this or any previous visit (from the past 24 hour(s)).  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 To start checking bp's daily, if >140/90 or sx let us know. Reviewed pre-e s/s, warning s/s, gave printed info on AVS.    Meds: No orders of the defined types were placed in this encounter.   Labs/procedures today: none  Plan:  Continue routine obstetrical care.  Has home bp cuff.  Check bp weekly, let us know if >140/90.  Next visit: prefers online    Reviewed: Preterm labor symptoms and general obstetric precautions including but not limited to vaginal bleeding, contractions, leaking of fluid and fetal movement were reviewed in detail with the patient. The patient was advised to call back or seek an in-person office evaluation/go to [facility] at [facility] for any urgent or concerning symptoms. All questions were answered. Please refer to After Visit Summary for other counseling recommendations.    I provided 15 minutes of non-face-to-face time during this encounter.  Follow-up: Return in about 17 days (around 08/21/2019) for [place], [place], CNM.  No orders of the defined types were placed in this encounter.  [place], [facility] 08/04/2019 9:46 AM

## 2019-08-08 ENCOUNTER — Encounter (HOSPITAL_COMMUNITY): Payer: Self-pay

## 2019-08-08 ENCOUNTER — Emergency Department (HOSPITAL_COMMUNITY)
Admission: EM | Admit: 2019-08-08 | Discharge: 2019-08-08 | Disposition: A | Discharge status: Home / Self Care | Payer: Medicaid Other | Attending: Emergency Medicine | Admitting: Emergency Medicine

## 2019-08-08 ENCOUNTER — Other Ambulatory Visit: Payer: Self-pay

## 2019-08-08 DIAGNOSIS — O133 Gestational [pregnancy-induced] hypertension without significant proteinuria, third trimester: Secondary | ICD-10-CM | POA: Diagnosis not present

## 2019-08-08 DIAGNOSIS — Z79899 Other long term (current) drug therapy: Secondary | ICD-10-CM | POA: Diagnosis not present

## 2019-08-08 DIAGNOSIS — Z3A32 32 weeks gestation of pregnancy: Secondary | ICD-10-CM | POA: Insufficient documentation

## 2019-08-08 DIAGNOSIS — Z87891 Personal history of nicotine dependence: Secondary | ICD-10-CM | POA: Insufficient documentation

## 2019-08-08 LAB — URINALYSIS, ROUTINE W REFLEX MICROSCOPIC
Bacteria, UA: NONE SEEN
Bilirubin Urine: NEGATIVE
Glucose, UA: NEGATIVE mg/dL
Hgb urine dipstick: NEGATIVE
Ketones, ur: NEGATIVE mg/dL
Leukocytes,Ua: NEGATIVE
Nitrite: NEGATIVE
Protein, ur: 30 mg/dL — AB
Specific Gravity, Urine: 1.027 (ref 1.005–1.030)
pH: 6 (ref 5.0–8.0)

## 2019-08-08 LAB — COMPREHENSIVE METABOLIC PANEL
ALT: 13 U/L (ref 0–44)
AST: 14 U/L — ABNORMAL LOW (ref 15–41)
Albumin: 3.2 g/dL — ABNORMAL LOW (ref 3.5–5.0)
Alkaline Phosphatase: 71 U/L (ref 38–126)
Anion gap: 8 (ref 5–15)
BUN: 8 mg/dL (ref 6–20)
CO2: 21 mmol/L — ABNORMAL LOW (ref 22–32)
Calcium: 9 mg/dL (ref 8.9–10.3)
Chloride: 108 mmol/L (ref 98–111)
Creatinine, Ser: 0.55 mg/dL (ref 0.44–1.00)
GFR calc Af Amer: >60 mL/min (ref >60)
GFR calc non Af Amer: >60 mL/min (ref >60)
Glucose, Bld: 99 mg/dL (ref 70–99)
Potassium: 3.4 mmol/L — ABNORMAL LOW (ref 3.5–5.1)
Sodium: 137 mmol/L (ref 135–145)
Total Bilirubin: 0.4 mg/dL (ref 0.3–1.2)
Total Protein: 7.3 g/dL (ref 6.5–8.1)

## 2019-08-08 LAB — CBC WITH DIFFERENTIAL/PLATELET
Abs Immature Granulocytes: 0.02 10*3/uL (ref 0.00–0.07)
Basophils Absolute: 0 10*3/uL (ref 0.0–0.1)
Basophils Relative: 1 %
Eosinophils Absolute: 0.1 10*3/uL (ref 0.0–0.5)
Eosinophils Relative: 2 %
HCT: 36.1 % (ref 36.0–46.0)
Hemoglobin: 12.1 g/dL (ref 12.0–15.0)
Immature Granulocytes: 0 %
Lymphocytes Relative: 25 %
Lymphs Abs: 1.5 10*3/uL (ref 0.7–4.0)
MCH: 27.8 pg (ref 26.0–34.0)
MCHC: 33.5 g/dL (ref 30.0–36.0)
MCV: 82.8 fL (ref 80.0–100.0)
Monocytes Absolute: 0.4 10*3/uL (ref 0.1–1.0)
Monocytes Relative: 7 %
Neutro Abs: 3.8 10*3/uL (ref 1.7–7.7)
Neutrophils Relative %: 65 %
Platelets: 288 10*3/uL (ref 150–400)
RBC: 4.36 MIL/uL (ref 3.87–5.11)
RDW: 13.7 % (ref 11.5–15.5)
WBC: 5.8 10*3/uL (ref 4.0–10.5)
nRBC: 0 % (ref 0.0–0.2)

## 2019-08-08 LAB — PROTEIN / CREATININE RATIO, URINE
Creatinine, Urine: 345.14 mg/dL
Protein Creatinine Ratio: 0.1 mg/mg{Cre} (ref 0.00–0.15)
Total Protein, Urine: 34 mg/dL

## 2019-08-08 LAB — URIC ACID: Uric Acid, Serum: 4.8 mg/dL (ref 2.5–7.1)

## 2019-08-08 NOTE — Progress Notes (Signed)
Dr Nehemiah Settle reviewed all lab results and most recent BP.  MD says pt may dc home with instructions to follow up with [facility] in the next week.  [facility] ED called and notified of this.

## 2019-08-08 NOTE — ED Provider Notes (Signed)
[facility] Provider Note   CSN: [id number] Arrival date & time: 08/08/19  1023     History Chief Complaint  Patient presents with  . Hypertension    Linda Warner is a 29 y.o. female.  G3, P1 32-week pregnant care with elevated blood pressure.  She said they noted elevated blood pressure during her recent [facility].  She was told to monitor it.  No prior history of hypertension and no preeclampsia with last pregnancy.  She has noticed some hot flashes and having a little bit of low abdominal cramping.  No bloody show no vaginal bleeding.  Had some headaches a few weeks ago but none recently.  No blurry vision double vision chest pain shortness of breath.  No peripheral edema.  The history is provided by the patient.  Hypertension This is a new problem. The current episode started more than 2 days ago. The problem occurs daily. The problem has been gradually worsening. Associated symptoms include abdominal pain (cramps) and headaches (not recent). Pertinent negatives include no chest pain and no shortness of breath. Nothing aggravates the symptoms. Nothing relieves the symptoms. She has tried rest for the symptoms. The treatment provided no relief.       Past Medical History:  Diagnosis Date  . Acne   . BV (bacterial vaginosis)   . Miscarriage   . 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 Pregnant   . Scoliosis   . Scoliosis     Patient Active Problem List   Diagnosis Date Noted  . Abnormal chromosomal and genetic finding on antenatal screening of mother 04/03/2019  . Supervision of normal pregnancy 03/20/2019  . Uterine fibroid 03/20/2019  . Family history of sickle cell disease 03/20/2019  . 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 SCOLIOSIS 07/25/2008  . MOOD SWINGS 10/14/2006    Past Surgical History:  Procedure Laterality Date  . NO PAST SURGERIES       OB History    Gravida  3   Para  1   Term  1   Preterm      AB    1   Living  1     SAB  1   TAB      Ectopic      Multiple      Live Births  1           Family History  Problem Relation Age of Onset  . Depression Father   . Depression Maternal Grandmother   . Diabetes Maternal Grandmother   . Hyperlipidemia Maternal Grandmother   . Hypertension Maternal Grandmother   . Cancer Maternal Grandmother        breast  . Sickle cell anemia Son   . Anesthesia problems Neg Hx     Social History   Tobacco Use  . Smoking status: Former Smoker    Years: 2.00    Types: Cigarettes  . Smokeless tobacco: Never Used  Substance Use Topics  . Alcohol use: Not Currently    Comment: random  . Drug use: No    Home Medications Prior to Admission medications   Medication Sig Start Date End Date Taking? Authorizing Provider  Blood Pressure Monitor MISC For regular home bp monitoring during pregnancy 03/20/19   Roma Schanz, CNM  butalbital-acetaminophen-caffeine (FIORICET) [phone] MG tablet Take 1-2 tablets by mouth every 6 (six) hours as needed for headache. Patient not taking: Reported on 07/21/2019 07/12/19 07/11/20  Myrtis Ser, CNM  metroNIDAZOLE (METROGEL VAGINAL) 0.75 % vaginal gel Nightly x 5 nights Patient not taking: Reported on 07/21/2019 06/30/19   Florian Buff, MD  Prenatal Vit-Fe Fumarate-FA (PRENATAL VITAMIN PO) Take by mouth.    [provider]    Allergies    Patient has no known allergies.  Review of Systems   Review of Systems  Constitutional: Negative for fever.  HENT: Negative for sore throat.   Eyes: Negative for visual disturbance.  Respiratory: Negative for shortness of breath.   Cardiovascular: Negative for chest pain and leg swelling.  Gastrointestinal: Positive for abdominal pain (cramps).  Genitourinary: Negative for dysuria and vaginal bleeding.  Musculoskeletal: Negative for neck pain.  Skin: Negative for rash.  Neurological: Positive for headaches (not recent).    Physical  Exam Updated Vital Signs BP 112/79 (BP Location: Left Arm)   Pulse 94   Temp 99.7 F (37.6 C) (Oral)   Resp 18   Ht 5\' 3"  (1.6 m)   Wt 114.8 kg   LMP 12/22/2018 (Approximate) Comment: positive home preg test  SpO2 99%   BMI 44.82 kg/m   Physical Exam Vitals and nursing note reviewed.  Constitutional:      General: She is not in acute distress.    Appearance: She is well-developed.  HENT:     Head: Normocephalic and atraumatic.  Eyes:     Conjunctiva/sclera: Conjunctivae normal.  Cardiovascular:     Rate and Rhythm: Normal rate and regular rhythm.     Heart sounds: No murmur.  Pulmonary:     Effort: Pulmonary effort is normal. No respiratory distress.     Breath sounds: Normal breath sounds.  Abdominal:     Palpations: Abdomen is soft.     Tenderness: There is no abdominal tenderness.     Comments: Gravid  Musculoskeletal:        General: No deformity or signs of injury. Normal range of motion.     Cervical back: Neck supple.     Right lower leg: No edema.     Left lower leg: No edema.  Skin:    General: Skin is warm and dry.     Capillary Refill: Capillary refill takes less than 2 seconds.  Neurological:     General: No focal deficit present.     Mental Status: She is alert and oriented to person, place, and time.     ED Results / Procedures / Treatments   Labs (all labs ordered are listed, but only abnormal results are displayed) Labs Reviewed  COMPREHENSIVE METABOLIC PANEL - Abnormal; Notable for the following components:      Result Value   Potassium 3.4 (*)    CO2 21 (*)    Albumin 3.2 (*)    AST 14 (*)    All other components within normal limits  URINALYSIS, ROUTINE W REFLEX MICROSCOPIC - Abnormal; Notable for the following components:   APPearance CLOUDY (*)    Protein, ur 30 (*)    All other components within normal limits  CBC WITH DIFFERENTIAL/PLATELET  URIC ACID  PROTEIN / CREATININE RATIO, URINE    EKG None  Radiology No results  found.  Procedures Procedures (including critical care time)  Medications Ordered in ED Medications - No data to display  ED Course  I have reviewed the triage vital signs and the nursing notes.  Pertinent labs & imaging results that were available during my care of the patient were reviewed by me and considered in my medical decision making (  see chart for details).  Clinical Course as of Aug 07 1813  Tue Aug 08, 2019  1051 Differential includes hypertension, preeclampsia, eclampsia, metabolic derangement   [MB]  1052 Patient placed on fetal monitoring and rapid response contacted for monitoring   [MB]  1147 Last BP down to 132/80   [MB]  1147 Rapid response that they have seen no evidence of any fetal distress   [MB]  1209 Last BP now 131/87.   [MB]  1216 Discussed with Dr. Glo Herring from [facility] who will be down to see the patient and help with disposition.   [MB]  1252 Patient was seen by Dr. Glo Herring and he recommends the patient can be discharged and follow back up in the office for blood pressure check this week and bring her machine   [MB]    Clinical Course User Index [MB] Hayden Rasmussen, MD   MDM Rules/Calculators/A&P                      Final Clinical Impression(s) / ED Diagnoses Final diagnoses:  Gestational hypertension w/o significant proteinuria in 3rd trimester    Rx / DC Orders ED Discharge Orders    None       Hayden Rasmussen, MD 08/08/19 1815

## 2019-08-08 NOTE — Progress Notes (Signed)
Dr Nehemiah Settle notified that pt has come to [facility] ED reporting elevated bps at home.  PT is G3P1 at 32.5wk   MD made aware of current BP.  Pt says she does not currently have a headache and that its been a while since she has had one.  [facility] nurse  reports no swelling.  Pt also complains of lower abdominal cramping and hot flashed for the past few weeks.  She says her OB has been made aware of this.

## 2019-08-08 NOTE — ED Notes (Signed)
Notified OB rapid response.  She will monitor pt and call back.

## 2019-08-08 NOTE — Discharge Instructions (Addendum)
You were seen in the emergency department for evaluation of high blood pressure in the setting of third trimester pregnancy.  Your blood pressure was elevated here but your lab work did not show any signs of serious harm.  Dr. Glo Herring recommends that you follow-up in the office this week for repeat blood pressure check and to bring your machine with you so they can check the readings that you were getting.  If you experience any worsening symptoms please return to the emergency department.

## 2019-08-08 NOTE — ED Triage Notes (Signed)
Pt reports is 7 months pregnant and her bp this morning 148/102 then 162/108.  Reports has been having hot flashes and lower abd cramping for the past 2 weeks.  Reports her obgyn is aware.  Pt denies any vaginal bleeding or discharge.  Reports this is her 3rd pregnancy, has a 28 year old son and had a miscarriage with her first pregnancy.

## 2019-08-09 ENCOUNTER — Ambulatory Visit (INDEPENDENT_AMBULATORY_CARE_PROVIDER_SITE_OTHER): Payer: Medicaid Other | Admitting: Advanced Practice Midwife

## 2019-08-09 ENCOUNTER — Other Ambulatory Visit: Payer: Self-pay

## 2019-08-09 VITALS — BP 114/81 | HR 88 | Wt 248.0 lb

## 2019-08-09 DIAGNOSIS — Z3483 Encounter for supervision of other normal pregnancy, third trimester: Secondary | ICD-10-CM

## 2019-08-09 DIAGNOSIS — Z148 Genetic carrier of other disease: Secondary | ICD-10-CM

## 2019-08-09 DIAGNOSIS — Z3A32 32 weeks gestation of pregnancy: Secondary | ICD-10-CM

## 2019-08-09 DIAGNOSIS — M4120 Other idiopathic scoliosis, site unspecified: Secondary | ICD-10-CM

## 2019-08-09 DIAGNOSIS — O99891 Other specified diseases and conditions complicating pregnancy: Secondary | ICD-10-CM

## 2019-08-09 NOTE — Patient Instructions (Signed)

## 2019-08-09 NOTE — Progress Notes (Signed)
LOW-RISK PREGNANCY VISIT- WORK IN for f/u BP Patient name: Linda Warner MRN [id number]  Date of birth: 1991/01/31 Chief Complaint:   Routine Prenatal Visit (seen in ER for elevated bp)  History of Present Illness:   Linda Warner is a 29 y.o. G42P1011 female at [redacted]w[redacted]d with an Estimated Date of Delivery: 09/28/19 being seen today for ongoing management of a low-risk pregnancy.  Today she reports feeling nauseous but does not need meds; had ED visit 3/23 due to elevated BPs at home after vomiting; denies s/s pre-e today; had nl labs. Contractions: Not present. Vag. Bleeding: None.  Movement: Present. denies leaking of fluid. Review of Systems:   Pertinent items are noted in HPI Denies abnormal vaginal discharge w/ itching/odor/irritation, headaches, visual changes, shortness of breath, chest pain, abdominal pain, severe nausea/vomiting, or problems with urination or bowel movements unless otherwise stated above. Pertinent History Reviewed:  Reviewed past medical,surgical, social, obstetrical and family history.  Reviewed problem list, medications and allergies. Physical Assessment:   Vitals:   08/09/19 1055  BP: 114/81  Pulse: 88  Weight: 248 lb (112.5 kg)  Body mass index is 43.93 kg/m.        Physical Examination:   General appearance: Well appearing, and in no distress  Mental status: Alert, oriented to person, place, and time  Skin: Warm & dry  Cardiovascular: Normal heart rate noted  Respiratory: Normal respiratory effort, no distress  Abdomen: Soft, gravid, nontender  Pelvic: Cervical exam deferred         Extremities: Edema: None  Fetal Status: Fetal Heart Rate (bpm): 148 Fundal Height: 33 cm Movement: Present    Results for orders placed or performed during the hospital encounter of 08/08/19 (from the past 24 hour(s))  Urinalysis, Routine w reflex microscopic   Collection Time: 08/08/19 11:45 AM  Result Value Ref Range   Color, Urine YELLOW YELLOW   APPearance  CLOUDY (A) CLEAR   Specific Gravity, Urine 1.027 1.005 - 1.030   pH 6.0 5.0 - 8.0   Glucose, UA NEGATIVE NEGATIVE mg/dL   Hgb urine dipstick NEGATIVE NEGATIVE   Bilirubin Urine NEGATIVE NEGATIVE   Ketones, ur NEGATIVE NEGATIVE mg/dL   Protein, ur 30 (A) NEGATIVE mg/dL   Nitrite NEGATIVE NEGATIVE   Leukocytes,Ua NEGATIVE NEGATIVE   RBC / HPF 0-5 0 - 5 RBC/hpf   WBC, UA 0-5 0 - 5 WBC/hpf   Bacteria, UA NONE SEEN NONE SEEN   Squamous Epithelial / LPF 21-50 0 - 5   Mucus PRESENT   Protein / creatinine ratio, urine   Collection Time: 08/08/19 11:45 AM  Result Value Ref Range   Creatinine, Urine 345.14 mg/dL   Total Protein, Urine 34 mg/dL   Protein Creatinine Ratio 0.10 0.00 - 0.15 mg/mg[Cre]    Assessment & Plan:  1) Low-risk pregnancy G3P1011 at [redacted]w[redacted]d with an Estimated Date of Delivery: 09/28/19   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  Labs/procedures today: none  Plan:  Continue routine obstetrical care   Reviewed: Preterm labor symptoms and general obstetric precautions including but not limited to vaginal bleeding, contractions, leaking of fluid and  fetal movement were reviewed in detail with the patient.  All questions were answered. Has home bp cuff. Check bp weekly, let us know if >140/90.   Follow-up: Return for [facility] 08/21/19 as scheduled.  No orders of the defined types were placed in this encounter.  Myrtis Ser CNM 08/09/2019 11:27 AM

## 2019-08-21 ENCOUNTER — Other Ambulatory Visit: Payer: Self-pay

## 2019-08-21 ENCOUNTER — Encounter: Payer: Self-pay | Admitting: Women's Health

## 2019-08-21 ENCOUNTER — Telehealth (INDEPENDENT_AMBULATORY_CARE_PROVIDER_SITE_OTHER): Payer: Medicaid Other | Admitting: Women's Health

## 2019-08-21 VITALS — BP 125/86 | HR 102

## 2019-08-21 DIAGNOSIS — Z331 Pregnant state, incidental: Secondary | ICD-10-CM

## 2019-08-21 DIAGNOSIS — Z3483 Encounter for supervision of other normal pregnancy, third trimester: Secondary | ICD-10-CM | POA: Diagnosis not present

## 2019-08-21 DIAGNOSIS — Z3A34 34 weeks gestation of pregnancy: Secondary | ICD-10-CM

## 2019-08-21 DIAGNOSIS — O1213 Gestational proteinuria, third trimester: Secondary | ICD-10-CM

## 2019-08-21 DIAGNOSIS — N898 Other specified noninflammatory disorders of vagina: Secondary | ICD-10-CM | POA: Diagnosis not present

## 2019-08-21 LAB — POCT WET PREP (WET MOUNT)
Clue Cells Wet Prep Whiff POC: NEGATIVE
Trichomonas Wet Prep HPF POC: ABSENT

## 2019-08-21 LAB — POCT URINALYSIS DIPSTICK OB
Blood, UA: NEGATIVE
Glucose, UA: NEGATIVE
Ketones, UA: NEGATIVE
Leukocytes, UA: NEGATIVE
Nitrite, UA: NEGATIVE

## 2019-08-21 MED ORDER — METRONIDAZOLE 500 MG PO TABS: 500.0000 mg | ORAL_TABLET | Freq: Two times a day (BID) | ORAL | 0 refills | Status: DC

## 2019-08-21 NOTE — Progress Notes (Signed)
LOW-RISK PREGNANCY VISIT Patient name: Linda Warner MRN [id number]  Date of birth: 1990-10-12 Chief Complaint:   Routine Prenatal Visit  History of Present Illness:   Linda Warner is a 29 y.o. G49P1011 female at [redacted]w[redacted]d with an Estimated Date of Delivery: 09/28/19 being seen today for ongoing management of a low-risk pregnancy. Initially mychart video visit, but had elevated bp's, so came in for in-office visit.  Today she reports elevated bp at home. Denies ha, visual changes, ruq/epigastric pain, n/v.  No h/o HTN. Same FOB. Went to [facility] 3/23 w/ elevated bp's at home, were normal there w/ normal pre-e labs.  Reports abnormal vaginal odor, no itching/irritation. Has to shower TID d/t smell. Had BV about a month ago, used gel.   Contractions: Not present. Vag. Bleeding: None.  Movement: Present. denies leaking of fluid. Review of Systems:   Pertinent items are noted in HPI Denies abnormal vaginal discharge w/ itching/odor/irritation, headaches, visual changes, shortness of breath, chest pain, abdominal pain, severe nausea/vomiting, or problems with urination or bowel movements unless otherwise stated above. Pertinent History Reviewed:  Reviewed past medical,surgical, social, obstetrical and family history.  Reviewed problem list, medications and allergies. Physical Assessment:   Vitals:   08/21/19 0952 08/21/19 0954 08/21/19 1051  BP: (!) 143/104 (!) 144/109 125/86  Pulse:   (!) 102  There is no height or weight on file to calculate BMI.        Physical Examination:   General appearance: Well appearing, and in no distress  Mental status: Alert, oriented to person, place, and time  Skin: Warm & dry  Cardiovascular: Normal heart rate noted  Respiratory: Normal respiratory effort, no distress  Abdomen: Soft, gravid, nontender  Pelvic: Spec exam: cx visually closed, mod amt white non-odorous d/c         Extremities: Edema: None FH: 34cm FHT: 152bpm via doppler  Chaperone: nurses  busy, pt stated she was ok w/o one    Results for orders placed or performed in visit on 08/21/19 (from the past 24 hour(s))  POC Urinalysis Dipstick OB   Collection Time: 08/21/19 10:55 AM  Result Value Ref Range   Color, UA     Clarity, UA     Glucose, UA Negative Negative   Bilirubin, UA     Ketones, UA neg    Spec Grav, UA     Blood, UA neg    pH, UA     POC,PROTEIN,UA Small (1+) Negative, Trace, Small (1+), Moderate (2+), Large (3+), 4+   Urobilinogen, UA     Nitrite, UA neg    Leukocytes, UA Negative Negative   Appearance     Odor    POCT Wet Prep [facility])   Collection Time: 08/21/19 11:23 AM  Result Value Ref Range   Source Wet Prep POC vaginal    WBC, Wet Prep HPF POC few    Bacteria Wet Prep HPF POC Few Few   BACTERIA WET PREP MORPHOLOGY POC     Clue Cells Wet Prep HPF POC Moderate (A) None   Clue Cells Wet Prep Whiff POC Negative Whiff    Yeast Wet Prep HPF POC None None   KOH Wet Prep POC     Trichomonas Wet Prep HPF POC Absent Absent    Assessment & Plan:  1) Low-risk pregnancy G3P1011 at [redacted]w[redacted]d with an Estimated Date of Delivery: 09/28/19   2) BV, rx metronidazole BID x 7d  3) BP normal here> asymptomatic, does have 1+  proteinuria which is new, will repeat pre-e labs and add urine cx. Home bp cuff not accurate, will need in person visits remainder of pregnancy. Reviewed pre-e s/s, reasons to seek care.    Meds:  Meds ordered this encounter  Medications  . metroNIDAZOLE (FLAGYL) 500 MG tablet    Sig: Take 1 tablet (500 mg total) by mouth 2 (two) times daily.    Dispense:  14 tablet    Refill:  0    Order Specific Question:   Supervising Provider    Answer:   Florian Buff [[id number]]   Labs/procedures today: wet prep, labs, urine cx  Plan:  Continue routine obstetrical care  Next visit: prefers in person    Reviewed: Preterm labor symptoms and general obstetric precautions including but not limited to vaginal bleeding, contractions, leaking of fluid and  fetal movement were reviewed in detail with the patient.  All questions were answered.   Follow-up: Return in about 1 week (around 08/28/2019) for [place], in person, CNM.  Orders Placed This Encounter  Procedures  . Urine Culture  . CBC  . Comprehensive metabolic panel  . Protein / creatinine ratio, urine  . POC Urinalysis Dipstick OB  . POCT Wet Prep [facility] [place])   Roma Schanz CNM, [facility] 08/21/2019 11:28 AM

## 2019-08-21 NOTE — Patient Instructions (Signed)
Linda Warner, I greatly value your feedback.  If you receive a survey following your visit with [place] today, we appreciate you taking the time to fill it out.  Thanks, Knute Neu, CNM, WHNP-BC  [facility] [place] [facility] ([place],  [address]) Entrance C, located off of [place] parking   Go to [email or web address] to register for FREE online childbirth classes    Call the office [phone]) or go to [facility] if:  You begin to have strong, frequent contractions  Your water breaks.  Sometimes it is a big gush of fluid, sometimes it is just a trickle that keeps getting your panties wet or running down your legs  You have vaginal bleeding.  It is normal to have a small amount of spotting if your cervix was checked.   You don't feel your baby moving like normal.  If you don't, get you something to eat and drink and lay down and focus on feeling your baby move.  You should feel at least 10 movements in 2 hours.  If you don't, you should call the office or go to [facility].   Call the office [phone]) or go to [facility] for these signs of pre-eclampsia:  Severe headache that does not go away with Tylenol  Visual changes- seeing spots, double, blurred vision  Pain under your right breast or upper abdomen that does not go away with Tums or heartburn medicine  Nausea and/or vomiting  Severe swelling in your hands, feet, and face    Home Blood Pressure Monitoring for Patients   Your provider has recommended that you check your blood pressure (BP) at least once a week at home. If you do not have a blood pressure cuff at home, one will be provided for you. Contact your provider if you have not received your monitor within 1 week.   Helpful Tips for Accurate Home Blood Pressure Checks  . Don't smoke, exercise, or drink caffeine 30 minutes before checking your BP . Use the restroom before checking your BP (a full  bladder can raise your pressure) . Relax in a comfortable upright chair . Feet on the ground . Left arm resting comfortably on a flat surface at the level of your heart . Legs uncrossed . Back supported . Sit quietly and don't talk . Place the cuff on your bare arm . Adjust snuggly, so that only two fingertips can fit between your skin and the top of the cuff . Check 2 readings separated by at least one minute . Keep a log of your BP readings . For a visual, please reference this diagram: http://ccnc.care/bpdiagram  Provider Name: [facility]     Phone: [phone]  Zone 1: ALL CLEAR  Continue to monitor your symptoms:  . BP reading is less than 140 (top number) or less than 90 (bottom number)  . No right upper stomach pain . No headaches or seeing spots . No feeling nauseated or throwing up . No swelling in face and hands  Zone 2: CAUTION Call your doctor's office for any of the following:  . BP reading is greater than 140 (top number) or greater than 90 (bottom number)  . Stomach pain under your ribs in the middle or right side . Headaches or seeing spots . Feeling nauseated or throwing up . Swelling in face and hands  Zone 3: EMERGENCY  Seek immediate medical care if you have any of  the following:  . BP reading is greater than160 (top number) or greater than 110 (bottom number) . Severe headaches not improving with Tylenol . Serious difficulty catching your breath . Any worsening symptoms from Zone 2  Preterm Labor and Birth Information  The normal length of a pregnancy is 39-41 weeks. Preterm labor is when labor starts before 37 completed weeks of pregnancy. What are the risk factors for preterm labor? Preterm labor is more likely to occur in women who:  Have certain infections during pregnancy such as a bladder infection, sexually transmitted infection, or infection inside the uterus (chorioamnionitis).  Have a shorter-than-normal cervix.  Have gone into  preterm labor before.  Have had surgery on their cervix.  Are younger than age 54 or older than age 25.  Are African American.  Are pregnant with twins or multiple babies (multiple gestation).  Take street drugs or smoke while pregnant.  Do not gain enough weight while pregnant.  Became pregnant shortly after having been pregnant. What are the symptoms of preterm labor? Symptoms of preterm labor include:  Cramps similar to those that can happen during a menstrual period. The cramps may happen with diarrhea.  Pain in the abdomen or lower back.  Regular uterine contractions that may feel like tightening of the abdomen.  A feeling of increased pressure in the pelvis.  Increased watery or bloody mucus discharge from the vagina.  Water breaking (ruptured amniotic sac). Why is it important to recognize signs of preterm labor? It is important to recognize signs of preterm labor because babies who are born prematurely may not be fully developed. This can put them at an increased risk for:  Long-term (chronic) heart and lung problems.  Difficulty immediately after birth with regulating body systems, including blood sugar, body temperature, heart rate, and breathing rate.  Bleeding in the brain.  Cerebral palsy.  Learning difficulties.  Death. These risks are highest for babies who are born before 48 weeks of pregnancy. How is preterm labor treated? Treatment depends on the length of your pregnancy, your condition, and the health of your baby. It may involve: 1. Having a stitch (suture) placed in your cervix to prevent your cervix from opening too early (cerclage). 2. Taking or being given medicines, such as: ? Hormone medicines. These may be given early in pregnancy to help support the pregnancy. ? Medicine to stop contractions. ? Medicines to help mature the baby's lungs. These may be prescribed if the risk of delivery is high. ? Medicines to prevent your baby from  developing cerebral palsy. If the labor happens before 34 weeks of pregnancy, you may need to stay in the hospital. What should I do if I think I am in preterm labor? If you think that you are going into preterm labor, call your health care provider right away. How can I prevent preterm labor in future pregnancies? To increase your chance of having a full-term pregnancy:  Do not use any tobacco products, such as cigarettes, chewing tobacco, and e-cigarettes. If you need help quitting, ask your health care provider.  Do not use street drugs or medicines that have not been prescribed to you during your pregnancy.  Talk with your health care provider before taking any herbal supplements, even if you have been taking them regularly.  Make sure you gain a healthy amount of weight during your pregnancy.  Watch for infection. If you think that you might have an infection, get it checked right away.  Make sure to  tell your health care provider if you have gone into preterm labor before. This information is not intended to replace advice given to you by your health care provider. Make sure you discuss any questions you have with your health care provider. Document Revised: 08/26/2018 Document Reviewed: 09/25/2015 Elsevier Patient Education  [place].

## 2019-08-22 LAB — PROTEIN / CREATININE RATIO, URINE
Creatinine, Urine: 218.8 mg/dL
Protein, Ur: 54.1 mg/dL
Protein/Creat Ratio: 247 mg/g creat — ABNORMAL HIGH (ref 0–200)

## 2019-08-22 LAB — CBC
Hematocrit: 35.6 % (ref 34.0–46.6)
Hemoglobin: 12.2 g/dL (ref 11.1–15.9)
MCH: 27.9 pg (ref 26.6–33.0)
MCHC: 34.3 g/dL (ref 31.5–35.7)
MCV: 82 fL (ref 79–97)
Platelets: 283 10*3/uL (ref 150–450)
RBC: 4.37 x10E6/uL (ref 3.77–5.28)
RDW: 14.3 % (ref 11.7–15.4)
WBC: 6.9 10*3/uL (ref 3.4–10.8)

## 2019-08-22 LAB — COMPREHENSIVE METABOLIC PANEL
ALT: 7 IU/L (ref 0–32)
AST: 10 IU/L (ref 0–40)
Albumin/Globulin Ratio: 1.1 — ABNORMAL LOW (ref 1.2–2.2)
Albumin: 3.3 g/dL — ABNORMAL LOW (ref 3.9–5.0)
Alkaline Phosphatase: 103 IU/L (ref 39–117)
BUN/Creatinine Ratio: 14 (ref 9–23)
BUN: 8 mg/dL (ref 6–20)
Bilirubin Total: 0.3 mg/dL (ref 0.0–1.2)
CO2: 19 mmol/L — ABNORMAL LOW (ref 20–29)
Calcium: 9 mg/dL (ref 8.7–10.2)
Chloride: 105 mmol/L (ref 96–106)
Creatinine, Ser: 0.56 mg/dL — ABNORMAL LOW (ref 0.57–1.00)
GFR calc Af Amer: 148 mL/min/{1.73_m2} (ref >59)
GFR calc non Af Amer: 128 mL/min/{1.73_m2} (ref >59)
Globulin, Total: 3.1 g/dL (ref 1.5–4.5)
Glucose: 67 mg/dL (ref 65–99)
Potassium: 4 mmol/L (ref 3.5–5.2)
Sodium: 139 mmol/L (ref 134–144)
Total Protein: 6.4 g/dL (ref 6.0–8.5)

## 2019-08-23 ENCOUNTER — Telehealth: Payer: Self-pay | Admitting: Women's Health

## 2019-08-23 ENCOUNTER — Inpatient Hospital Stay (HOSPITAL_COMMUNITY)
Admission: AD | Admit: 2019-08-23 | Discharge: 2019-08-23 | Disposition: A | Discharge status: Home / Self Care | Payer: Medicaid Other | Attending: Obstetrics and Gynecology | Admitting: Obstetrics and Gynecology

## 2019-08-23 ENCOUNTER — Encounter (HOSPITAL_COMMUNITY): Payer: Self-pay | Admitting: Obstetrics and Gynecology

## 2019-08-23 ENCOUNTER — Other Ambulatory Visit: Payer: Self-pay

## 2019-08-23 DIAGNOSIS — O10913 Unspecified pre-existing hypertension complicating pregnancy, third trimester: Secondary | ICD-10-CM | POA: Diagnosis not present

## 2019-08-23 DIAGNOSIS — O163 Unspecified maternal hypertension, third trimester: Secondary | ICD-10-CM | POA: Diagnosis not present

## 2019-08-23 DIAGNOSIS — R12 Heartburn: Secondary | ICD-10-CM | POA: Insufficient documentation

## 2019-08-23 DIAGNOSIS — R1013 Epigastric pain: Secondary | ICD-10-CM | POA: Diagnosis not present

## 2019-08-23 DIAGNOSIS — O4703 False labor before 37 completed weeks of gestation, third trimester: Secondary | ICD-10-CM | POA: Insufficient documentation

## 2019-08-23 DIAGNOSIS — O479 False labor, unspecified: Secondary | ICD-10-CM

## 2019-08-23 DIAGNOSIS — Z87891 Personal history of nicotine dependence: Secondary | ICD-10-CM | POA: Insufficient documentation

## 2019-08-23 DIAGNOSIS — Z3A34 34 weeks gestation of pregnancy: Secondary | ICD-10-CM | POA: Insufficient documentation

## 2019-08-23 DIAGNOSIS — O26893 Other specified pregnancy related conditions, third trimester: Secondary | ICD-10-CM | POA: Insufficient documentation

## 2019-08-23 DIAGNOSIS — M419 Scoliosis, unspecified: Secondary | ICD-10-CM | POA: Diagnosis not present

## 2019-08-23 DIAGNOSIS — O169 Unspecified maternal hypertension, unspecified trimester: Secondary | ICD-10-CM

## 2019-08-23 LAB — CBC
HCT: 34.6 % — ABNORMAL LOW (ref 36.0–46.0)
Hemoglobin: 11.7 g/dL — ABNORMAL LOW (ref 12.0–15.0)
MCH: 27.4 pg (ref 26.0–34.0)
MCHC: 33.8 g/dL (ref 30.0–36.0)
MCV: 81 fL (ref 80.0–100.0)
Platelets: 292 10*3/uL (ref 150–400)
RBC: 4.27 MIL/uL (ref 3.87–5.11)
RDW: 13.5 % (ref 11.5–15.5)
WBC: 6.3 10*3/uL (ref 4.0–10.5)
nRBC: 0 % (ref 0.0–0.2)

## 2019-08-23 LAB — COMPREHENSIVE METABOLIC PANEL
ALT: 11 U/L (ref 0–44)
AST: 10 U/L — ABNORMAL LOW (ref 15–41)
Albumin: 2.6 g/dL — ABNORMAL LOW (ref 3.5–5.0)
Alkaline Phosphatase: 85 U/L (ref 38–126)
Anion gap: 9 (ref 5–15)
BUN: 6 mg/dL (ref 6–20)
CO2: 21 mmol/L — ABNORMAL LOW (ref 22–32)
Calcium: 9 mg/dL (ref 8.9–10.3)
Chloride: 108 mmol/L (ref 98–111)
Creatinine, Ser: 0.6 mg/dL (ref 0.44–1.00)
GFR calc Af Amer: >60 mL/min (ref >60)
GFR calc non Af Amer: >60 mL/min (ref >60)
Glucose, Bld: 91 mg/dL (ref 70–99)
Potassium: 3.5 mmol/L (ref 3.5–5.1)
Sodium: 138 mmol/L (ref 135–145)
Total Bilirubin: 0.4 mg/dL (ref 0.3–1.2)
Total Protein: 6.3 g/dL — ABNORMAL LOW (ref 6.5–8.1)

## 2019-08-23 LAB — PROTEIN / CREATININE RATIO, URINE
Creatinine, Urine: 252.58 mg/dL
Protein Creatinine Ratio: 0.1 mg/mg{Cre} (ref 0.00–0.15)
Total Protein, Urine: 25 mg/dL

## 2019-08-23 LAB — URINE CULTURE

## 2019-08-23 LAB — TROPONIN I (HIGH SENSITIVITY): Troponin I (High Sensitivity): 3 ng/L (ref <18)

## 2019-08-23 MED ORDER — ALUM & MAG HYDROXIDE-SIMETH 200-200-20 MG/5ML PO SUSP
30.0000 mL | Freq: Once | ORAL | Status: AC
  Administered 2019-08-23: 13:00:00 30 mL via ORAL
  Filled 2019-08-23: qty 30

## 2019-08-23 MED ORDER — PANTOPRAZOLE SODIUM 20 MG PO TBEC: 20.0000 mg | DELAYED_RELEASE_TABLET | Freq: Every day | ORAL | 0 refills | Status: DC

## 2019-08-23 MED ORDER — LIDOCAINE VISCOUS HCL 2 % MT SOLN
15.0000 mL | Freq: Once | OROMUCOSAL | Status: AC
  Administered 2019-08-23: 15 mL via ORAL
  Filled 2019-08-23: qty 15

## 2019-08-23 NOTE — MAU Provider Note (Signed)
Chief Complaint:  Abdominal Pain, Chest Pain, and Hypertension   First Provider Initiated Contact with Patient 08/23/19 1130      HPI: Linda Warner is a 29 y.o. G3P1011 at [redacted]w[redacted]d who presents to maternity admissions reporting cramping and back pain, chest pain, and HTN on her home cuff.  She reports intermittent irregular cramping in her low abdomen that radiates to her low back, this occurs 2-3 times per hour.  She has pain in her upper right side of her chest that is a squeezing pain.  It is constant, but worsens intermittently. It started yesterday.  She was seen in the ED at [facility] on 08/08/19 for HTN on her home cuff. Preeclampsia labs were normal with protein creatinine ratio 0.10 and BP were normal at the ED.   She reports good fetal movement, denies LOF, vaginal bleeding, vaginal itching/burning, urinary symptoms, h/a, dizziness, n/v, or fever/chills.    HPI  Past Medical History: Past Medical History:  Diagnosis Date  . Acne   . BV (bacterial vaginosis)   . Miscarriage   . 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 Pregnant   . Scoliosis   . Scoliosis     Past obstetric history: OB History  Gravida Para Term Preterm AB Living  3 1 1   1 1   SAB TAB Ectopic Multiple Live Births  1       1    # Outcome Date GA Lbr Len/2nd Weight Sex Delivery Anes PTL Lv  3 Current           2 Term 01/07/13 [redacted]w[redacted]d 21:01 / 02:49 3335 g M Vag-Spont EPI N LIV     Birth Comments: 96 w 6 d, NVD, IOL due to non reactive NST. Loose nuchal cord. Mom 21y/o G2P1, GBS positive, treated. ROM 6.5 hrs.  Elevated DSR 1:110 with first trimester screen. No Harmony testing done. Subchorionic hematoma. Former smoker. Mom A+/ Baby ?, Bili 8.9 @ 45 hrs   1 SAB 09/27/11 [redacted]w[redacted]d            Birth Comments: "passed naturally.  they said the fetus stopped growing at 8 wks."    Past Surgical History: Past Surgical History:  Procedure Laterality Date  . NO PAST SURGERIES      Family History: Family History   Problem Relation Age of Onset  . Depression Father   . Depression Maternal Grandmother   . Diabetes Maternal Grandmother   . Hyperlipidemia Maternal Grandmother   . Hypertension Maternal Grandmother   . Cancer Maternal Grandmother        breast  . Sickle cell anemia Son   . Anesthesia problems Neg Hx     Social History: Social History   Tobacco Use  . Smoking status: Former Smoker    Years: 2.00    Types: Cigarettes  . Smokeless tobacco: Never Used  Substance Use Topics  . Alcohol use: Not Currently    Comment: random  . Drug use: No    Allergies: No Known Allergies  Meds:  No medications prior to admission.    ROS:  Review of Systems  Constitutional: Negative for chills, fatigue and fever.  Eyes: Negative for visual disturbance.  Respiratory: Negative for shortness of breath.   Cardiovascular: Positive for chest pain.  Gastrointestinal: Positive for abdominal pain. Negative for nausea and vomiting.  Genitourinary: Negative for difficulty urinating, dysuria, flank pain, pelvic pain, vaginal bleeding, vaginal discharge and vaginal pain.  Neurological: Negative for dizziness and headaches.  Psychiatric/Behavioral:  Negative.      I have reviewed patient's Past Medical Hx, Surgical Hx, Family Hx, Social Hx, medications and allergies.   Physical Exam   No data found.  BP (!) 128/57 (BP Location: Right Arm)   Pulse 83   Temp 97.8 F (36.6 C)   Resp 16   Wt 112.5 kg   LMP 12/22/2018 (Approximate) Comment: positive home preg test  SpO2 99%   BMI 43.93 kg/m     Constitutional: Well-developed, well-nourished female in no acute distress.  Cardiovascular: normal rate Respiratory: normal effort GI: Abd soft, non-tender, gravid appropriate for gestational age.  MS: Extremities nontender, no edema, normal ROM Neurologic: Alert and oriented x 4.  GU: Neg CVAT.  PELVIC EXAM: Cervix pink, visually closed, without lesion, scant white creamy discharge, vaginal  walls and external genitalia normal Bimanual exam: Cervix 0/long/high, firm, anterior, neg CMT, uterus nontender, nonenlarged, adnexa without tenderness, enlargement, or mass  Dilation: Closed Effacement (%): Thick Exam by:: L. Leftwich-Kirby CNM  FHT:  Baseline 150 , moderate variability, accelerations present, no decelerations Contractions: None on toco or to palpation   Labs: No results found for this or any previous visit (from the past 24 hour(s)). A/Positive/-- (11/02 1128) Results for orders placed or performed during the hospital encounter of 08/23/19 (from the past 48 hour(s))  CBC     Status: Abnormal   Collection Time: 08/23/19 11:12 AM  Result Value Ref Range   WBC 6.3 4.0 - 10.5 K/uL   RBC 4.27 3.87 - 5.11 MIL/uL   Hemoglobin 11.7 (L) 12.0 - 15.0 g/dL   HCT 34.6 (L) 36.0 - 46.0 %   MCV 81.0 80.0 - 100.0 fL   MCH 27.4 26.0 - 34.0 pg   MCHC 33.8 30.0 - 36.0 g/dL   RDW 13.5 11.5 - 15.5 %   Platelets 292 150 - 400 K/uL   nRBC 0.0 0.0 - 0.2 %    Comment: Performed at [facility], [place] [address] [place],  [address]  Comprehensive metabolic panel     Status: Abnormal   Collection Time: 08/23/19 11:12 AM  Result Value Ref Range   Sodium 138 135 - 145 mmol/L   Potassium 3.5 3.5 - 5.1 mmol/L   Chloride 108 98 - 111 mmol/L   CO2 21 (L) 22 - 32 mmol/L   Glucose, Bld 91 70 - 99 mg/dL    Comment: Glucose reference range applies only to samples taken after fasting for at least 8 hours.   BUN 6 6 - 20 mg/dL   Creatinine, Ser 0.60 0.44 - 1.00 mg/dL   Calcium 9.0 8.9 - 10.3 mg/dL   Total Protein 6.3 (L) 6.5 - 8.1 g/dL   Albumin 2.6 (L) 3.5 - 5.0 g/dL   AST 10 (L) 15 - 41 U/L   ALT 11 0 - 44 U/L   Alkaline Phosphatase 85 38 - 126 U/L   Total Bilirubin 0.4 0.3 - 1.2 mg/dL   GFR calc non Af Amer >60 >60 mL/min   GFR calc Af Amer >60 >60 mL/min   Anion gap 9 5 - 15    Comment: Performed at [facility] [address], [place], [place] [address]  Troponin  I (High Sensitivity)     Status: None   Collection Time: 08/23/19 11:12 AM  Result Value Ref Range   Troponin I (High Sensitivity) 3 <18 ng/L    Comment: (NOTE) Elevated high sensitivity troponin I (hsTnI) values and significant  changes across serial measurements may suggest ACS but many other  chronic and acute conditions are known to elevate hsTnI results.  Refer to the "Links" section for chest pain algorithms and additional  guidance. Performed at [facility], [place] [address], [place], [place] [address]   Protein / creatinine ratio, urine     Status: None   Collection Time: 08/23/19 11:48 AM  Result Value Ref Range   Creatinine, Urine 252.58 mg/dL   Total Protein, Urine 25 mg/dL    Comment: NO NORMAL RANGE ESTABLISHED FOR THIS TEST   Protein Creatinine Ratio 0.10 0.00 - 0.15 mg/mg[Cre]    Comment: Performed at [facility] [address] [place], [place] [address]   Imaging:  No results found.  MAU Course/MDM: Orders Placed This Encounter  Procedures  . CBC  . Comprehensive metabolic panel  . Protein / creatinine ratio, urine  . ED EKG  . ED EKG  . EKG 12-Lead  . Discharge patient    Meds ordered this encounter  Medications  . AND Linked Order Group   . alum & mag hydroxide-simeth (MAALOX/MYLANTA) 200-200-20 MG/5ML suspension 30 mL   . lidocaine (XYLOCAINE) 2 % viscous mouth solution 15 mL  . pantoprazole (PROTONIX) 20 MG tablet    Sig: Take 1 tablet (20 mg total) by mouth daily.    Dispense:  30 tablet    Refill:  0    Order Specific Question:   Supervising Provider    Answer:   Aletha Halim [id number]     NST reviewed and reactive No evidence of preterm labor with closed cervix EKG with normal sinus rhythm, troponin, CBC, CMP normal Pt given GI Cocktail with relief of symptoms so likely acid reflux/espophageal spasms causing chest pain Discussed food choices with acid reflux, Rx for Protonix daily D/C home with strict return  precautions.    Assessment: 1. Hypertension affecting pregnancy in third trimester   2. Epigastric pain   3. Heartburn during pregnancy in third trimester   4. Braxton Hicks contractions     Plan: Discharge home Labor precautions and fetal kick counts Follow-up Information    [facility] Follow up.   Specialty: Obstetrics and Gynecology Why: As scheduled, return to MAU as needed for emergencies. Contact information: [address] [place] [phone]         Allergies as of 08/23/2019   No Known Allergies     Medication List    TAKE these medications   Blood Pressure Monitor Misc For regular home bp monitoring during pregnancy   metroNIDAZOLE 0.75 % vaginal gel Commonly known as: METROGEL VAGINAL Nightly x 5 nights   metroNIDAZOLE 500 MG tablet Commonly known as: FLAGYL Take 1 tablet (500 mg total) by mouth 2 (two) times daily.   pantoprazole 20 MG tablet Commonly known as: PROTONIX Take 1 tablet (20 mg total) by mouth daily.   PRENATAL VITAMIN PO Take 1 tablet by mouth daily.     ASK your doctor about these medications   butalbital-acetaminophen-caffeine 50-325-40 MG tablet Commonly known as: FIORICET Take 1-2 tablets by mouth every 6 (six) hours as needed for headache.       Fatima Blank Certified Nurse-Midwife 08/24/2019 3:12 PM

## 2019-08-23 NOTE — Telephone Encounter (Signed)
Patient called, stated that she was 34 weeks and is having chest pain, she stated that it was very different than heartburn.  I discussed with Marcie Bal and she advised to tell the patient to go to the ER.  Patient was told and she verbally understood.

## 2019-08-23 NOTE — MAU Note (Addendum)
.   Linda Warner is a 29 y.o. at [redacted]w[redacted]d here in MAU reporting: started having pain in her chest yesterday and lower abdominal cramping. Reports epigastric pain that comes and goes. PT states she has been hospitalized once with this pregnancy but is not taking any B/P meds. Had labs drawn at her visit on Monday and states here labs were normal  Onset of complaint: ongoing Pain score: chest 7/10 abd 3/10 Vitals:   08/23/19 1040  BP: (!) 137/98  Pulse: 92  Resp: 16  Temp: 97.8 F (36.6 C)  SpO2: 100%     FHT:150 Lab orders placed from triage: UA

## 2019-08-30 ENCOUNTER — Other Ambulatory Visit: Payer: Self-pay

## 2019-08-30 ENCOUNTER — Ambulatory Visit (INDEPENDENT_AMBULATORY_CARE_PROVIDER_SITE_OTHER): Payer: Medicaid Other | Admitting: Women's Health

## 2019-08-30 ENCOUNTER — Encounter: Payer: Self-pay | Admitting: Women's Health

## 2019-08-30 VITALS — BP 124/88 | HR 102 | Wt 253.0 lb

## 2019-08-30 DIAGNOSIS — Z3483 Encounter for supervision of other normal pregnancy, third trimester: Secondary | ICD-10-CM

## 2019-08-30 DIAGNOSIS — Z1389 Encounter for screening for other disorder: Secondary | ICD-10-CM

## 2019-08-30 DIAGNOSIS — Z331 Pregnant state, incidental: Secondary | ICD-10-CM

## 2019-08-30 LAB — POCT URINALYSIS DIPSTICK OB
Blood, UA: NEGATIVE
Glucose, UA: NEGATIVE
Ketones, UA: NEGATIVE
Leukocytes, UA: NEGATIVE
Nitrite, UA: NEGATIVE
POC,PROTEIN,UA: NEGATIVE

## 2019-08-30 NOTE — Patient Instructions (Signed)
Gasper Sells, I greatly value your feedback.  If you receive a survey following your visit with [place] today, we appreciate you taking the time to fill it out.  Thanks, Knute Neu, CNM, WHNP-BC  [facility] ([place], [place] [address]) Entrance C, located off of [address] parking   Go to [email or web address] to register for FREE online childbirth classes    Call the office [phone]) or go to [facility] if:  You begin to have strong, frequent contractions  Your water breaks.  Sometimes it is a big gush of fluid, sometimes it is just a trickle that keeps getting your panties wet or running down your legs  You have vaginal bleeding.  It is normal to have a small amount of spotting if your cervix was checked.   You don't feel your baby moving like normal.  If you don't, get you something to eat and drink and lay down and focus on feeling your baby move.  You should feel at least 10 movements in 2 hours.  If you don't, you should call the office or go to [facility].   Call the office [phone]) or go to [facility] hospital for these signs of pre-eclampsia:  Severe headache that does not go away with Tylenol  Visual changes- seeing spots, double, blurred vision  Pain under your right breast or upper abdomen that does not go away with Tums or heartburn medicine  Nausea and/or vomiting  Severe swelling in your hands, feet, and face    Home Blood Pressure Monitoring for Patients   Your provider has recommended that you check your blood pressure (BP) at least once a week at home. If you do not have a blood pressure cuff at home, one will be provided for you. Contact your provider if you have not received your monitor within 1 week.   Helpful Tips for Accurate Home Blood Pressure Checks  . Don't smoke, exercise, or drink caffeine 30 minutes before checking your BP . Use the restroom before checking your BP (a full  bladder can raise your pressure) . Relax in a comfortable upright chair . Feet on the ground . Left arm resting comfortably on a flat surface at the level of your heart . Legs uncrossed . Back supported . Sit quietly and don't talk . Place the cuff on your bare arm . Adjust snuggly, so that only two fingertips can fit between your skin and the top of the cuff . Check 2 readings separated by at least one minute . Keep a log of your BP readings . For a visual, please reference this diagram: http://ccnc.care/bpdiagram  Provider Name: [facility]     Phone: [phone]  Zone 1: ALL CLEAR  Continue to monitor your symptoms:  . BP reading is less than 140 (top number) or less than 90 (bottom number)  . No right upper stomach pain . No headaches or seeing spots . No feeling nauseated or throwing up . No swelling in face and hands  Zone 2: CAUTION Call your doctor's office for any of the following:  . BP reading is greater than 140 (top number) or greater than 90 (bottom number)  . Stomach pain under your ribs in the middle or right side . Headaches or seeing spots . Feeling nauseated or throwing up . Swelling in face and hands  Zone 3: EMERGENCY  Seek immediate medical care if you have any of  the following:  . BP reading is greater than160 (top number) or greater than 110 (bottom number) . Severe headaches not improving with Tylenol . Serious difficulty catching your breath . Any worsening symptoms from Zone 2  Preterm Labor and Birth Information  The normal length of a pregnancy is 39-41 weeks. Preterm labor is when labor starts before 37 completed weeks of pregnancy. What are the risk factors for preterm labor? Preterm labor is more likely to occur in women who:  Have certain infections during pregnancy such as a bladder infection, sexually transmitted infection, or infection inside the uterus (chorioamnionitis).  Have a shorter-than-normal cervix.  Have gone into  preterm labor before.  Have had surgery on their cervix.  Are younger than age 54 or older than age 25.  Are African American.  Are pregnant with twins or multiple babies (multiple gestation).  Take street drugs or smoke while pregnant.  Do not gain enough weight while pregnant.  Became pregnant shortly after having been pregnant. What are the symptoms of preterm labor? Symptoms of preterm labor include:  Cramps similar to those that can happen during a menstrual period. The cramps may happen with diarrhea.  Pain in the abdomen or lower back.  Regular uterine contractions that may feel like tightening of the abdomen.  A feeling of increased pressure in the pelvis.  Increased watery or bloody mucus discharge from the vagina.  Water breaking (ruptured amniotic sac). Why is it important to recognize signs of preterm labor? It is important to recognize signs of preterm labor because babies who are born prematurely may not be fully developed. This can put them at an increased risk for:  Long-term (chronic) heart and lung problems.  Difficulty immediately after birth with regulating body systems, including blood sugar, body temperature, heart rate, and breathing rate.  Bleeding in the brain.  Cerebral palsy.  Learning difficulties.  Death. These risks are highest for babies who are born before 48 weeks of pregnancy. How is preterm labor treated? Treatment depends on the length of your pregnancy, your condition, and the health of your baby. It may involve: 1. Having a stitch (suture) placed in your cervix to prevent your cervix from opening too early (cerclage). 2. Taking or being given medicines, such as: ? Hormone medicines. These may be given early in pregnancy to help support the pregnancy. ? Medicine to stop contractions. ? Medicines to help mature the baby's lungs. These may be prescribed if the risk of delivery is high. ? Medicines to prevent your baby from  developing cerebral palsy. If the labor happens before 34 weeks of pregnancy, you may need to stay in the hospital. What should I do if I think I am in preterm labor? If you think that you are going into preterm labor, call your health care provider right away. How can I prevent preterm labor in future pregnancies? To increase your chance of having a full-term pregnancy:  Do not use any tobacco products, such as cigarettes, chewing tobacco, and e-cigarettes. If you need help quitting, ask your health care provider.  Do not use street drugs or medicines that have not been prescribed to you during your pregnancy.  Talk with your health care provider before taking any herbal supplements, even if you have been taking them regularly.  Make sure you gain a healthy amount of weight during your pregnancy.  Watch for infection. If you think that you might have an infection, get it checked right away.  Make sure to  tell your health care provider if you have gone into preterm labor before. This information is not intended to replace advice given to you by your health care provider. Make sure you discuss any questions you have with your health care provider. Document Revised: 08/26/2018 Document Reviewed: 09/25/2015 Elsevier Patient Education  [place].

## 2019-08-30 NOTE — Progress Notes (Signed)
.     LOW-RISK PREGNANCY VISIT Patient name: Linda Warner MRN [id number]  Date of birth: 1990/07/25 Chief Complaint:   Routine Prenatal Visit  History of Present Illness:   Linda Warner is a 29 y.o. G39P1011 female at [redacted]w[redacted]d with an Estimated Date of Delivery: 09/28/19 being seen today for ongoing management of a low-risk pregnancy.   Today she reports went to [facility]U recently for chest pain, neg workup- told it could be heartburn. None since, not taking protonix. . Contractions: Not present. Vag. Bleeding: None.  Movement: Present. denies leaking of fluid. Review of Systems:   Pertinent items are noted in HPI Denies abnormal vaginal discharge w/ itching/odor/irritation, headaches, visual changes, shortness of breath, chest pain, abdominal pain, severe nausea/vomiting, or problems with urination or bowel movements unless otherwise stated above. Pertinent History Reviewed:  Reviewed past medical,surgical, social, obstetrical and family history.  Reviewed problem list, medications and allergies. Physical Assessment:   Vitals:   08/30/19 0900  BP: 124/88  Pulse: (!) 102  Weight: 253 lb (114.8 kg)  Body mass index is 44.82 kg/m.        Physical Examination:   General appearance: Well appearing, and in no distress  Mental status: Alert, oriented to person, place, and time  Skin: Warm & dry  Cardiovascular: Normal heart rate noted  Respiratory: Normal respiratory effort, no distress  Abdomen: Soft, gravid, nontender  Pelvic: Cervical exam deferred         Extremities: Edema: None  Fetal Status: Fetal Heart Rate (bpm): 153 Fundal Height: 36 cm Movement: Present    Chaperone: n/a    Results for orders placed or performed in visit on 08/30/19 (from the past 24 hour(s))  POC Urinalysis Dipstick OB   Collection Time: 08/30/19  9:01 AM  Result Value Ref Range   Color, UA     Clarity, UA     Glucose, UA Negative Negative   Bilirubin, UA     Ketones, UA neg    Spec Grav, UA     Blood, UA neg    pH, UA     POC,PROTEIN,UA Negative Negative, Trace, Small (1+), Moderate (2+), Large (3+), 4+   Urobilinogen, UA     Nitrite, UA neg    Leukocytes, UA Negative Negative   Appearance     Odor      Assessment & Plan:  1) Low-risk pregnancy G3P1011 at [redacted]w[redacted]d with an Estimated Date of Delivery: 09/28/19    Meds: No orders of the defined types were placed in this encounter.  Labs/procedures today: none  Plan:  Continue routine obstetrical care  Next visit: prefers in person    Reviewed: Preterm labor symptoms and general obstetric precautions including but not limited to vaginal bleeding, contractions, leaking of fluid and fetal movement were reviewed in detail with the patient.  All questions were answered.  Follow-up: Return in about 1 week (around 09/06/2019) for [place], in person, CNM.  Orders Placed This Encounter  Procedures  . POC Urinalysis Dipstick OB   Roma Schanz CNM, [facility] 08/30/2019 9:19 AM

## 2019-09-04 ENCOUNTER — Ambulatory Visit (INDEPENDENT_AMBULATORY_CARE_PROVIDER_SITE_OTHER): Payer: Medicaid Other | Admitting: Advanced Practice Midwife

## 2019-09-04 ENCOUNTER — Telehealth (HOSPITAL_COMMUNITY): Payer: Self-pay | Admitting: *Deleted

## 2019-09-04 ENCOUNTER — Other Ambulatory Visit: Payer: Self-pay

## 2019-09-04 ENCOUNTER — Other Ambulatory Visit (HOSPITAL_COMMUNITY)
Admission: RE | Admit: 2019-09-04 | Discharge: 2019-09-04 | Disposition: A | Discharge status: Home / Self Care | Payer: Medicaid Other | Source: Ambulatory Visit | Attending: Advanced Practice Midwife | Admitting: Advanced Practice Midwife

## 2019-09-04 VITALS — BP 140/92 | HR 103 | Wt 250.0 lb

## 2019-09-04 DIAGNOSIS — O139 Gestational [pregnancy-induced] hypertension without significant proteinuria, unspecified trimester: Secondary | ICD-10-CM | POA: Insufficient documentation

## 2019-09-04 DIAGNOSIS — Z3A36 36 weeks gestation of pregnancy: Secondary | ICD-10-CM | POA: Diagnosis not present

## 2019-09-04 DIAGNOSIS — O133 Gestational [pregnancy-induced] hypertension without significant proteinuria, third trimester: Secondary | ICD-10-CM | POA: Diagnosis not present

## 2019-09-04 DIAGNOSIS — Z8759 Personal history of other complications of pregnancy, childbirth and the puerperium: Secondary | ICD-10-CM | POA: Insufficient documentation

## 2019-09-04 DIAGNOSIS — Z3483 Encounter for supervision of other normal pregnancy, third trimester: Secondary | ICD-10-CM | POA: Diagnosis not present

## 2019-09-04 LAB — POCT URINALYSIS DIPSTICK OB
Blood, UA: NEGATIVE
Glucose, UA: NEGATIVE
Leukocytes, UA: NEGATIVE
Nitrite, UA: NEGATIVE

## 2019-09-04 NOTE — Progress Notes (Signed)
   LOW-RISK PREGNANCY VISIT Patient name: Linda Warner MRN [id number]  Date of birth: 03-11-1991 Chief Complaint:   No chief complaint on file.  History of Present Illness:   Linda Warner is a 29 y.o. G71P1011 female at [redacted]w[redacted]d with an Estimated Date of Delivery: 09/28/19 being seen today for ongoing management of a low-risk pregnancy.  Today she reports no complaints. Contractions: Irritability. Vag. Bleeding: None.  Movement: Present. denies leaking of fluid. Review of Systems:   Pertinent items are noted in HPI Denies abnormal vaginal discharge w/ itching/odor/irritation, headaches, visual changes, shortness of breath, chest pain, abdominal pain, severe nausea/vomiting, or problems with urination or bowel movements unless otherwise stated above. Pertinent History Reviewed:  Reviewed past medical,surgical, social, obstetrical and family history.  Reviewed problem list, medications and allergies. Physical Assessment:   Vitals:   09/04/19 1134 09/04/19 1137  BP: (!) 130/91 (!) 140/92  Pulse: (!) 103   Weight: 250 lb (113.4 kg)   Body mass index is 44.29 kg/m.        Physical Examination:   General appearance: Well appearing, and in no distress  Mental status: Alert, oriented to person, place, and time  Skin: Warm & dry  Cardiovascular: Normal heart rate noted  Respiratory: Normal respiratory effort, no distress  Abdomen: Soft, gravid, nontender  Pelvic: Cervical exam performed  Dilation: 1.5 Effacement (%): 50 Station: -2  Extremities: Edema: None  Fetal Status: Fetal Heart Rate (bpm): 145 Fundal Height: 37 cm Movement: Present Presentation: Vertex  Chaperone: [place]    Results for orders placed or performed in visit on 09/04/19 (from the past 24 hour(s))  POC Urinalysis Dipstick OB   Collection Time: 09/04/19 11:35 AM  Result Value Ref Range   Color, UA     Clarity, UA     Glucose, UA Negative Negative   Bilirubin, UA     Ketones, UA small    Spec Grav, UA      Blood, UA neg    pH, UA     POC,PROTEIN,UA Small (1+) Negative, Trace, Small (1+), Moderate (2+), Large (3+), 4+   Urobilinogen, UA     Nitrite, UA neg    Leukocytes, UA Negative Negative   Appearance     Odor      Assessment & Plan:  1) Low-risk pregnancy G3P1011 at [redacted]w[redacted]d with an Estimated Date of Delivery: 09/28/19   2) GTHN, dx today, Outpt foley 4/22, IOL 4/23   Check PR/CR ratio, if preE, IOL    Meds: No orders of the defined types were placed in this encounter.  Labs/procedures today: pr/cr ratio; GBS/CHL/GC  Plan: IOL for GHTN, orders in and form faxed    Reviewed: Term labor symptoms and general obstetric precautions including but not limited to vaginal bleeding, contractions, leaking of fluid and fetal movement were reviewed in detail with the patient.  All questions were answered. Follow-up: Return in about 3 days (around 09/07/2019) for Foley placement/HROB.  Orders Placed This Encounter  Procedures  . Culture, beta strep (group b only)  . Protein / creatinine ratio, urine  . POC Urinalysis Dipstick OB   Christin Fudge DNP, CNM 09/04/2019 12:23 PM

## 2019-09-04 NOTE — Progress Notes (Signed)
Induction Assessment Scheduling Form: Fax to [facility]:  [phone] Route to [facility] Labor Delivery   Linda Warner                                                                                   DOB:  1990/11/27                                                            MRN:  [id number]  Phone:  Home Phone [phone]  Mobile [phone]    Provider:  [facility]  GP:  [id number]                                                            Estimated Date of Delivery: 09/28/19  Dating Criteria: LMP    Medical Indications for induction:  GHTN Admission Date/Time:  09/08/19 (am please) Gestational age on admission: 62.1   Filed Weights   09/04/19 1134  Weight: 250 lb (113.4 kg)   HIV:  Non Reactive (02/12 0907) GBS:   collected 4/19    Method of induction(proposed):  Foley - outpt then pitcoin   Scheduling Provider Signature:  Christin Fudge, CNM                                            Today's Date:  09/04/2019

## 2019-09-04 NOTE — Telephone Encounter (Signed)
Preadmission screen  

## 2019-09-05 ENCOUNTER — Encounter (HOSPITAL_COMMUNITY): Payer: Self-pay | Admitting: Obstetrics and Gynecology

## 2019-09-05 ENCOUNTER — Other Ambulatory Visit: Payer: Self-pay

## 2019-09-05 ENCOUNTER — Other Ambulatory Visit (HOSPITAL_COMMUNITY)
Admission: RE | Admit: 2019-09-05 | Discharge: 2019-09-05 | Disposition: A | Discharge status: Home / Self Care | Payer: Medicaid Other | Source: Ambulatory Visit | Attending: Obstetrics & Gynecology | Admitting: Obstetrics & Gynecology

## 2019-09-05 ENCOUNTER — Inpatient Hospital Stay (HOSPITAL_COMMUNITY): Payer: Medicaid Other | Admitting: Anesthesiology

## 2019-09-05 ENCOUNTER — Inpatient Hospital Stay (HOSPITAL_COMMUNITY)
Admission: AD | Admit: 2019-09-05 | Discharge: 2019-09-07 | DRG: 807 | Disposition: A | Discharge status: Home / Self Care | Payer: Medicaid Other | Attending: Family Medicine | Admitting: Family Medicine

## 2019-09-05 DIAGNOSIS — O321XX0 Maternal care for breech presentation, not applicable or unspecified: Secondary | ICD-10-CM | POA: Diagnosis present

## 2019-09-05 DIAGNOSIS — O134 Gestational [pregnancy-induced] hypertension without significant proteinuria, complicating childbirth: Secondary | ICD-10-CM | POA: Diagnosis present

## 2019-09-05 DIAGNOSIS — O139 Gestational [pregnancy-induced] hypertension without significant proteinuria, unspecified trimester: Secondary | ICD-10-CM | POA: Diagnosis present

## 2019-09-05 DIAGNOSIS — O285 Abnormal chromosomal and genetic finding on antenatal screening of mother: Secondary | ICD-10-CM | POA: Diagnosis present

## 2019-09-05 DIAGNOSIS — O99214 Obesity complicating childbirth: Secondary | ICD-10-CM | POA: Diagnosis present

## 2019-09-05 DIAGNOSIS — Z20822 Contact with and (suspected) exposure to covid-19: Secondary | ICD-10-CM | POA: Diagnosis present

## 2019-09-05 DIAGNOSIS — Z8759 Personal history of other complications of pregnancy, childbirth and the puerperium: Secondary | ICD-10-CM | POA: Diagnosis present

## 2019-09-05 DIAGNOSIS — Z832 Family history of diseases of the blood and blood-forming organs and certain disorders involving the immune mechanism: Secondary | ICD-10-CM

## 2019-09-05 DIAGNOSIS — Z3490 Encounter for supervision of normal pregnancy, unspecified, unspecified trimester: Secondary | ICD-10-CM

## 2019-09-05 DIAGNOSIS — D259 Leiomyoma of uterus, unspecified: Secondary | ICD-10-CM | POA: Diagnosis present

## 2019-09-05 DIAGNOSIS — Z87891 Personal history of nicotine dependence: Secondary | ICD-10-CM

## 2019-09-05 DIAGNOSIS — O42913 Preterm premature rupture of membranes, unspecified as to length of time between rupture and onset of labor, third trimester: Principal | ICD-10-CM

## 2019-09-05 DIAGNOSIS — F53 Postpartum depression: Secondary | ICD-10-CM | POA: Diagnosis present

## 2019-09-05 DIAGNOSIS — Z3A36 36 weeks gestation of pregnancy: Secondary | ICD-10-CM | POA: Diagnosis not present

## 2019-09-05 DIAGNOSIS — O164 Unspecified maternal hypertension, complicating childbirth: Secondary | ICD-10-CM | POA: Diagnosis not present

## 2019-09-05 DIAGNOSIS — O99345 Other mental disorders complicating the puerperium: Secondary | ICD-10-CM | POA: Diagnosis present

## 2019-09-05 LAB — PROTEIN / CREATININE RATIO, URINE
Creatinine, Urine: 163.8 mg/dL
Creatinine, Urine: 294.6 mg/dL
Protein Creatinine Ratio: 0.12 mg/mg{Cre} (ref 0.00–0.15)
Protein, Ur: 53.8 mg/dL
Protein/Creat Ratio: 183 mg/g creat (ref 0–200)
Total Protein, Urine: 20 mg/dL

## 2019-09-05 LAB — COMPREHENSIVE METABOLIC PANEL
ALT: 11 U/L (ref 0–44)
AST: 12 U/L — ABNORMAL LOW (ref 15–41)
Albumin: 3 g/dL — ABNORMAL LOW (ref 3.5–5.0)
Alkaline Phosphatase: 96 U/L (ref 38–126)
Anion gap: 11 (ref 5–15)
BUN: 8 mg/dL (ref 6–20)
CO2: 20 mmol/L — ABNORMAL LOW (ref 22–32)
Calcium: 9.3 mg/dL (ref 8.9–10.3)
Chloride: 107 mmol/L (ref 98–111)
Creatinine, Ser: 0.55 mg/dL (ref 0.44–1.00)
GFR calc Af Amer: >60 mL/min (ref >60)
GFR calc non Af Amer: >60 mL/min (ref >60)
Glucose, Bld: 83 mg/dL (ref 70–99)
Potassium: 3.8 mmol/L (ref 3.5–5.1)
Sodium: 138 mmol/L (ref 135–145)
Total Bilirubin: 0.6 mg/dL (ref 0.3–1.2)
Total Protein: 6.9 g/dL (ref 6.5–8.1)

## 2019-09-05 LAB — CBC
HCT: 38 % (ref 36.0–46.0)
Hemoglobin: 12.7 g/dL (ref 12.0–15.0)
MCH: 27.4 pg (ref 26.0–34.0)
MCHC: 33.4 g/dL (ref 30.0–36.0)
MCV: 82.1 fL (ref 80.0–100.0)
Platelets: 309 10*3/uL (ref 150–400)
RBC: 4.63 MIL/uL (ref 3.87–5.11)
RDW: 14 % (ref 11.5–15.5)
WBC: 7.7 10*3/uL (ref 4.0–10.5)
nRBC: 0 % (ref 0.0–0.2)

## 2019-09-05 LAB — CERVICOVAGINAL ANCILLARY ONLY
Chlamydia: NEGATIVE
Comment: NEGATIVE
Comment: NORMAL
Neisseria Gonorrhea: NEGATIVE

## 2019-09-05 LAB — TYPE AND SCREEN
ABO/RH(D): A POS
Antibody Screen: NEGATIVE

## 2019-09-05 LAB — RESPIRATORY PANEL BY RT PCR (FLU A&B, COVID)
Influenza A by PCR: NEGATIVE
Influenza B by PCR: NEGATIVE
SARS Coronavirus 2 by RT PCR: NEGATIVE

## 2019-09-05 LAB — POCT FERN TEST: POCT Fern Test: POSITIVE

## 2019-09-05 LAB — RPR: RPR Ser Ql: NONREACTIVE

## 2019-09-05 MED ORDER — PHENYLEPHRINE 40 MCG/ML (10ML) SYRINGE FOR IV PUSH (FOR BLOOD PRESSURE SUPPORT): 80.0000 ug | PREFILLED_SYRINGE | INTRAVENOUS | Status: DC | PRN

## 2019-09-05 MED ORDER — TERBUTALINE SULFATE 1 MG/ML IJ SOLN: 0.2500 mg | Freq: Once | INTRAMUSCULAR | Status: DC | PRN

## 2019-09-05 MED ORDER — SOD CITRATE-CITRIC ACID 500-334 MG/5ML PO SOLN: 30.0000 mL | ORAL | Status: DC | PRN

## 2019-09-05 MED ORDER — PENICILLIN G POT IN DEXTROSE 60000 UNIT/ML IV SOLN
3.0000 10*6.[IU] | INTRAVENOUS | Status: DC
  Administered 2019-09-05 (×3): 3 10*6.[IU] via INTRAVENOUS
  Filled 2019-09-05 (×3): qty 50

## 2019-09-05 MED ORDER — HYDROXYZINE HCL 50 MG PO TABS: 50.0000 mg | ORAL_TABLET | Freq: Four times a day (QID) | ORAL | Status: DC | PRN

## 2019-09-05 MED ORDER — MISOPROSTOL 25 MCG QUARTER TABLET
ORAL_TABLET | ORAL | Status: AC
  Filled 2019-09-05: qty 1

## 2019-09-05 MED ORDER — ACETAMINOPHEN 325 MG PO TABS: 650.0000 mg | ORAL_TABLET | ORAL | Status: DC | PRN

## 2019-09-05 MED ORDER — LACTATED RINGERS IV SOLN: INTRAVENOUS | Status: DC

## 2019-09-05 MED ORDER — FENTANYL-BUPIVACAINE-NACL 0.5-0.125-0.9 MG/250ML-% EP SOLN: 12.0000 mL/h | EPIDURAL | Status: DC | PRN

## 2019-09-05 MED ORDER — LIDOCAINE HCL (PF) 1 % IJ SOLN: 30.0000 mL | INTRAMUSCULAR | Status: DC | PRN

## 2019-09-05 MED ORDER — DIPHENHYDRAMINE HCL 50 MG/ML IJ SOLN: 12.5000 mg | INTRAMUSCULAR | Status: DC | PRN

## 2019-09-05 MED ORDER — OXYTOCIN BOLUS FROM INFUSION
500.0000 mL | Freq: Once | INTRAVENOUS | Status: AC
  Administered 2019-09-05: 500 mL via INTRAVENOUS

## 2019-09-05 MED ORDER — FENTANYL CITRATE (PF) 100 MCG/2ML IJ SOLN
50.0000 ug | INTRAMUSCULAR | Status: DC | PRN
  Administered 2019-09-05 (×3): 100 ug via INTRAVENOUS
  Filled 2019-09-05 (×4): qty 2

## 2019-09-05 MED ORDER — OXYTOCIN 40 UNITS IN NORMAL SALINE INFUSION - SIMPLE MED
1.0000 m[IU]/min | INTRAVENOUS | Status: DC
  Administered 2019-09-05: 2 m[IU]/min via INTRAVENOUS

## 2019-09-05 MED ORDER — OXYTOCIN 40 UNITS IN NORMAL SALINE INFUSION - SIMPLE MED: 1.0000 m[IU]/min | INTRAVENOUS | Status: DC

## 2019-09-05 MED ORDER — OXYCODONE-ACETAMINOPHEN 5-325 MG PO TABS: 2.0000 | ORAL_TABLET | ORAL | Status: DC | PRN

## 2019-09-05 MED ORDER — LEVONORGESTREL 19.5 MCG/DAY IU IUD: INTRAUTERINE_SYSTEM | Freq: Once | INTRAUTERINE | Status: DC

## 2019-09-05 MED ORDER — SODIUM CHLORIDE (PF) 0.9 % IJ SOLN
INTRAMUSCULAR | Status: DC | PRN
  Administered 2019-09-05: 12 mL/h via EPIDURAL

## 2019-09-05 MED ORDER — EPHEDRINE 5 MG/ML INJ: 10.0000 mg | INTRAVENOUS | Status: DC | PRN

## 2019-09-05 MED ORDER — BUPIVACAINE HCL (PF) 0.25 % IJ SOLN
INTRAMUSCULAR | Status: DC | PRN
  Administered 2019-09-05: 1 mL via INTRATHECAL

## 2019-09-05 MED ORDER — ONDANSETRON HCL 4 MG/2ML IJ SOLN: 4.0000 mg | Freq: Four times a day (QID) | INTRAMUSCULAR | Status: DC | PRN

## 2019-09-05 MED ORDER — OXYTOCIN 40 UNITS IN NORMAL SALINE INFUSION - SIMPLE MED
2.5000 [IU]/h | INTRAVENOUS | Status: DC
  Filled 2019-09-05: qty 1000

## 2019-09-05 MED ORDER — LIDOCAINE HCL (PF) 1 % IJ SOLN
INTRAMUSCULAR | Status: DC | PRN
  Administered 2019-09-05 (×2): 6 mL via EPIDURAL

## 2019-09-05 MED ORDER — BETAMETHASONE SOD PHOS & ACET 6 (3-3) MG/ML IJ SUSP
12.0000 mg | INTRAMUSCULAR | Status: DC
  Administered 2019-09-05: 12 mg via INTRAMUSCULAR
  Filled 2019-09-05: qty 2
  Filled 2019-09-05: qty 5

## 2019-09-05 MED ORDER — FENTANYL-BUPIVACAINE-NACL 0.5-0.125-0.9 MG/250ML-% EP SOLN
12.0000 mL/h | EPIDURAL | Status: DC | PRN
  Filled 2019-09-05: qty 250

## 2019-09-05 MED ORDER — FLEET ENEMA 7-19 GM/118ML RE ENEM: 1.0000 | ENEMA | RECTAL | Status: DC | PRN

## 2019-09-05 MED ORDER — LACTATED RINGERS IV SOLN: 500.0000 mL | INTRAVENOUS | Status: DC | PRN

## 2019-09-05 MED ORDER — MISOPROSTOL 25 MCG QUARTER TABLET
25.0000 ug | ORAL_TABLET | ORAL | Status: DC | PRN
  Administered 2019-09-05: 25 ug via VAGINAL

## 2019-09-05 MED ORDER — LACTATED RINGERS IV SOLN: 500.0000 mL | Freq: Once | INTRAVENOUS | Status: DC

## 2019-09-05 MED ORDER — ZOLPIDEM TARTRATE 5 MG PO TABS: 5.0000 mg | ORAL_TABLET | Freq: Every evening | ORAL | Status: DC | PRN

## 2019-09-05 MED ORDER — SODIUM CHLORIDE 0.9 % IV SOLN
5.0000 10*6.[IU] | Freq: Once | INTRAVENOUS | Status: AC
  Administered 2019-09-05: 5 10*6.[IU] via INTRAVENOUS
  Filled 2019-09-05: qty 5

## 2019-09-05 MED ORDER — OXYCODONE-ACETAMINOPHEN 5-325 MG PO TABS: 1.0000 | ORAL_TABLET | ORAL | Status: DC | PRN

## 2019-09-05 NOTE — Progress Notes (Addendum)
Labor Progress Note Linda Warner is a 29 y.o. G3P1011 at [redacted]w[redacted]d presented for ROM.    S: Patient comfortable s/p epidural. Reports +FM and +CTX.     O:  BP 126/81   Pulse 76   Temp 98.5 F (36.9 C) (Oral)   Resp 18   Ht 5\' 3"  (1.6 m)   Wt 114.3 kg   LMP 12/22/2018 (Approximate) Comment: positive home preg test  SpO2 99%   BMI 44.64 kg/m  EFM: 145/moderate variability/pos accels, no decels  TOCO: every 2-3 minutes   CVE: Dilation: 4.5 Effacement (%): 80 Cervical Position: Posterior Station: -1 Presentation: Vertex Exam by:: Knute Neu, CNM   A&P: 29 y.o. [id number] [redacted]w[redacted]d for  #Labor: Progressing well s/p Cytotec and foley bulb.  Discussed pitocin with patient. Patient agreeable to start pitocin.  Start Pitocin 2x2.  Anticipate vaginal delivery.  #Pain: Epidural  #FWB: Cat 1  #GBS unknown, PCN x4  >4hr prior to delivery; GBS swab pending   #gHTN:  Recent SBPs low 140s - 110s. Continue to monitor.  Pr/Cr ratio 0.12, Plts 309,  #preterm labor:  PPROM at 0200 today. BMZ x1, hopeful to receive second dose tomorrow   Lyndee Hensen, DO 9:03 PM

## 2019-09-05 NOTE — MAU Note (Signed)
Pt reports to MAU with ROM at 0208, pt states fluid is clear with some bleeding. Pt reports no fetal movement since her water broke. She also reports being diagnosed with PIH and is scheduled for induction Friday.

## 2019-09-05 NOTE — Progress Notes (Signed)
Epidural catheter removed. Catheter intact, skin clean dry intact.

## 2019-09-05 NOTE — Progress Notes (Signed)
Patient ID: DYMON HAYE, female   DOB: 1990-09-16, 29 y.o.   MRN: [id number]  S/p PPROM at 0200 with only mild, irreg ctx since; recently with gHTN dx on 4/19 and IOL scheduled for 4/23- denies s/s pre-e; PCN x 2 doses  BP 130/77, 127/82, 134/83 FHR 140s, +accels, occ variables, +LTV Ctx irreg, mild Cx post/1-2 ext os, FT int os/50%/-3 (vtx by bedside u/s)  IUP@36 .5wks PPROM Cx unfavorable GBS unknown  -Unable to place cervical foley due to pt discomfort; will use cytotec for now for cervical ripening, and then either repeat cytotec as indicated or try foley again, or move to Pitocin based on pt response -Anticipate vag del  Myrtis Ser CNM 09/05/2019

## 2019-09-05 NOTE — Anesthesia Procedure Notes (Signed)
Epidural Patient location during procedure: OB Start time: 09/05/2019 5:55 PM End time: 09/05/2019 6:03 PM  Staffing Anesthesiologist: Janeece Riggers, MD  Preanesthetic Checklist Completed: patient identified, IV checked, site marked, risks and benefits discussed, surgical consent, monitors and equipment checked, pre-op evaluation and timeout performed  Epidural Patient position: sitting Prep: DuraPrep and site prepped and draped Patient monitoring: continuous pulse ox and blood pressure Approach: midline Location: L4-L5 Injection technique: LOR air  Needle:  Needle type: Tuohy  Needle gauge: 17 G Needle length: 9 cm and 9 Needle insertion depth: 5 cm cm Catheter type: closed end flexible Catheter size: 19 Gauge Catheter at skin depth: 10 cm Test dose: negative  Assessment Events: blood not aspirated, injection not painful, no injection resistance, no paresthesia and negative IV test  Additional Notes Repeated

## 2019-09-05 NOTE — Anesthesia Procedure Notes (Signed)
Epidural Patient location during procedure: OB  Staffing Anesthesiologist: Lidia Collum, MD Performed: anesthesiologist   Preanesthetic Checklist Completed: patient identified, IV checked, risks and benefits discussed, monitors and equipment checked, pre-op evaluation and timeout performed  Epidural Patient position: sitting Prep: DuraPrep Patient monitoring: heart rate, continuous pulse ox and blood pressure Approach: midline Location: L3-L4 Injection technique: LOR air Guidance: ultrasound guided  Needle:  Needle type: Tuohy (+ 12.7 cm 25g Pencan spinal needle)  Needle gauge: 17 G Needle length: 9 cm Needle insertion depth: 7 cm Catheter type: closed end flexible Catheter size: 19 Gauge Catheter at skin depth: 12 cm  Assessment Events: blood not aspirated, injection not painful, no injection resistance, no paresthesia and negative IV test  Additional Notes Ultrasound was first used to identify and mark relevant structures. The patient was prepped and draped in the usual sterile fashion. A combined spinal-epidural was performed using a 9 cm 17g Tuohy needle and loss of resistance technique. After encountering LOR, a 12.7 cm 25g Pencan spinal needle was introduced via the Tuohy and clear CSF was aspirated prior to injection of local anesthetic. The spinal needle was removed and a 19 g flexible epidural catheter placed prior to Tuohy removal. Patient tolerated the procedure well without complications.  **REPLACEMENT EPIDURAL**Reason for block:procedure for pain

## 2019-09-05 NOTE — Anesthesia Procedure Notes (Signed)
Epidural Patient location during procedure: OB Start time: 09/05/2019 4:21 PM End time: 09/05/2019 4:26 PM  Staffing Anesthesiologist: Janeece Riggers, MD  Preanesthetic Checklist Completed: patient identified, IV checked, site marked, risks and benefits discussed, surgical consent, monitors and equipment checked, pre-op evaluation and timeout performed  Epidural Patient position: sitting Prep: DuraPrep and site prepped and draped Patient monitoring: continuous pulse ox and blood pressure Approach: midline Location: L3-L4 Injection technique: LOR air  Needle:  Needle type: Tuohy  Needle gauge: 17 G Needle length: 9 cm and 9 Needle insertion depth: 8 cm Catheter type: closed end flexible Catheter size: 19 Gauge Catheter at skin depth: 12 cm Test dose: negative  Assessment Events: blood not aspirated, injection not painful, no injection resistance, no paresthesia and negative IV test

## 2019-09-05 NOTE — Anesthesia Preprocedure Evaluation (Signed)
Anesthesia Evaluation  Patient identified by MRN, date of birth, ID band Patient awake    Reviewed: Allergy & Precautions, H&P , NPO status , Patient's Chart, lab work & pertinent test results, reviewed documented beta blocker date and time   Airway Mallampati: II  TM Distance: >3 FB Neck ROM: full    Dental no notable dental hx.    Pulmonary neg pulmonary ROS, former smoker,    Pulmonary exam normal breath sounds clear to auscultation       Cardiovascular hypertension, Pt. on medications Normal cardiovascular exam Rhythm:regular Rate:Normal     Neuro/Psych negative neurological ROS  negative psych ROS   GI/Hepatic negative GI ROS, Neg liver ROS,   Endo/Other  Morbid obesity  Renal/GU negative Renal ROS  negative genitourinary   Musculoskeletal   Abdominal   Peds  Hematology negative hematology ROS (+)   Anesthesia Other Findings   Reproductive/Obstetrics (+) Pregnancy                             Anesthesia Physical Anesthesia Plan  ASA: III  Anesthesia Plan: Epidural   Post-op Pain Management:    Induction:   PONV Risk Score and Plan:   Airway Management Planned:   Additional Equipment:   Intra-op Plan:   Post-operative Plan:   Informed Consent: I have reviewed the patients History and Physical, chart, labs and discussed the procedure including the risks, benefits and alternatives for the proposed anesthesia with the patient or authorized representative who has indicated his/her understanding and acceptance.       Plan Discussed with: Anesthesiologist  Anesthesia Plan Comments:         Anesthesia Quick Evaluation

## 2019-09-05 NOTE — Progress Notes (Signed)
Patient ID: Linda Warner, female   DOB: Sep 21, 1990, 29 y.o.   MRN: [id number]  S/p cytotec x 1 dose; recently received Fentanyl and is feeling ready to try a cervical foley placement  135/79, 130/77, P 83 FHR 140s, +accels, no decels Ctx irreg, mild Cx 1+/50/vtx -2, soft  IUP@36 .5wks PPROM x 13h Cx unfavorable  Cervical foley placed without difficulty; will plan on Pitocin prn after it comes out Anticipate vag del  [place] 09/05/2019 3:49 PM

## 2019-09-05 NOTE — Discharge Summary (Addendum)
  Postpartum Discharge Summary      Patient Name: Linda Linda Warner DOB: 01/31/1991 MRN: [id number]  Date of admission: 09/05/2019 Delivering Provider: BOOKER,  R   Date of discharge: 09/07/2019  Admitting diagnosis: Gestational hypertension [O13.9] Intrauterine pregnancy: [redacted]w[redacted]d     Secondary diagnosis:  Active Problems:   Post partum depression   Supervision of normal pregnancy   Uterine fibroid   Family history of sickle cell disease   Abnormal chromosomal and genetic finding on antenatal screening of mother   Gestational hypertension  Additional problems: PPROM     Discharge diagnosis: Preterm Pregnancy Delivered and Gestational Hypertension                                                                                           Post partum procedures:None  Augmentation: Pitocin, Cytotec and Foley Balloon  Complications: None  Hospital course:  Induction of Labor With Vaginal Delivery   28 y.o. yo G3P1011 at [redacted]w[redacted]d was admitted to the hospital 09/05/2019 for induction of labor.  Indication for induction: PPROM, GHTN.  Patient had an uncomplicated labor course as follows: Membrane Rupture Time/Date: 2:00 AM ,09/05/2019   Intrapartum Procedures: Episiotomy: None [1]                                         Lacerations:  1st degree [2];Perineal [11]  Patient had delivery of a Viable infant.  Information for the patient's newborn:  Linda Warner, Boy Linda [[id number]]  Delivery Method: Vaginal, Spontaneous(Filed from Delivery Summary)    09/05/2019  Details of delivery can be found in separate delivery note.  Patient had a routine postpartum course. She was seen by SW on PPD#1 due to hx of PPD; no barriers to d/c. She had a couple of borderline BPs on PPD#1 and Norvasc was ordered, but these BPs resolved and the med was never started. Patient is discharged home 09/07/19. Delivery time: 9:53 PM    Magnesium Sulfate received: No BMZ received: Yes  x1 Rhophylac:N/A MMR:N/A Transfusion:No  Physical exam  Vitals:   09/06/19 1002 09/06/19 1348 09/06/19 2116 09/07/19 0553  BP: 109/73 117/83 106/71 128/71  Pulse: 95 86 100 100  Resp: 18 18 16 16  Temp: 98 F (36.7 C) 98.1 F (36.7 C) 98.4 F (36.9 C) 98.4 F (36.9 C)  TempSrc:  Axillary Oral Oral  SpO2:  100% 99%   Weight:      Height:       General: alert and no distress Lochia: appropriate Uterine Fundus: firm Incision: Na DVT Evaluation: No evidence of DVT seen on physical exam. No cords or calf tenderness. No significant calf/ankle edema. Labs: Lab Results  Component Value Date   WBC 7.7 09/05/2019   HGB 12.7 09/05/2019   HCT 38.0 09/05/2019   MCV 82.1 09/05/2019   PLT 309 09/05/2019   CMP Latest Ref Rng & Units 09/05/2019  Glucose 70 - 99 mg/dL 83  BUN 6 - 20 mg/dL 8  Creatinine 0.44 - 1.00 mg/dL 0.55  Sodium 135 - 145   mmol/L 138  Potassium 3.5 - 5.1 mmol/L 3.8  Chloride 98 - 111 mmol/L 107  CO2 22 - 32 mmol/L 20(L)  Calcium 8.9 - 10.3 mg/dL 9.3  Total Protein 6.5 - 8.1 g/dL 6.9  Total Bilirubin 0.3 - 1.2 mg/dL 0.6  Alkaline Phos 38 - 126 U/L 96  AST 15 - 41 U/L 12(L)  ALT 0 - 44 U/L 11   Edinburgh Score: Edinburgh Postnatal Depression Scale Screening Tool 09/07/2019  I have been able to laugh and see the funny side of things. 0  I have looked forward with enjoyment to things. 0  I have blamed myself unnecessarily when things went wrong. 0  I have been anxious or worried for no good reason. 0  I have felt scared or panicky for no good reason. 0  Things have been getting on top of me. 0  I have been so unhappy that I have had difficulty sleeping. 0  I have felt sad or miserable. 0  I have been so unhappy that I have been crying. 0  The thought of harming myself has occurred to me. 0  Edinburgh Postnatal Depression Scale Total 0    Discharge instruction: per After Visit Summary and "Baby and Me Booklet".  After visit meds:  Allergies as of  09/07/2019   No Known Allergies     Medication List    STOP taking these medications   metroNIDAZOLE 500 MG tablet Commonly known as: FLAGYL     TAKE these medications   Blood Pressure Monitor Misc For regular home bp monitoring during pregnancy   butalbital-acetaminophen-caffeine 50-325-40 MG tablet Commonly known as: FIORICET Take 1-2 tablets by mouth every 6 (six) hours as needed for headache.   prenatal multivitamin Tabs tablet Take 1 tablet by mouth daily at 12 noon.       Diet: routine diet  Activity: Advance as tolerated. Pelvic rest for 6 weeks.   Outpatient follow up:BP check in 1wk; PP visit in 4wks Follow up Appt: Future Appointments  Date Time Provider [place]  09/13/2019 11:10 AM [facility] NURSE [facility]  10/11/2019 11:50 AM Roma Schanz, CNM [facility]   Follow up Visit: Follow-up Information    [facility] Follow up.   Contact information: [address] [place] [address] [phone]         Roma Schanz, [place]  Gloris [place]  Please schedule this patient for PP visit in: 4-6wks, bp check in 1wk (can be online if has home bp cuff and if she wants to do online)  High risk pregnancy complicated by: GHTN  Delivery mode: SVD  Anticipated Birth Control: IUD after pp visit  PP Procedures needed: BP check  Provider: Any provider    Newborn Data: Live born female  Birth Weight:  3079gm (6.b 12.6oz) APGAR: 18, 9  Newborn Delivery   Birth date/time: 09/05/2019 21:53:00 Delivery type: Vaginal, Spontaneous      Baby Feeding: Bottle Disposition:home with mother if cleared for discharge by the pediatric team    09/07/2019 Linda Hensen, DO   CNM attestation I have seen and examined this patient and agree with above documentation in the resident's note.   Linda Linda Warner is a 29 y.o. [phone] s/p vag del.   Pain is well controlled.  Plan for birth control is IUD.  Method of  Feeding: bottle  PE:  BP 128/71   Pulse 100   Temp 98.4 F (36.9 C) (Oral)   Resp  16   Ht 5' 3" (1.6 m)   Wt 114.3 kg   LMP 12/22/2018 (Approximate) Comment: positive home preg test  SpO2 99%   Breastfeeding Unknown   BMI 44.64 kg/m  Fundus firm  Recent Labs    09/05/19 0355  HGB 12.7  HCT 38.0     Plan: discharge today - postpartum care discussed - f/u clinic in 1wk for BP check; 4 weeks for postpartum visit    D , CNM 9:15 AM  09/07/2019     

## 2019-09-05 NOTE — H&P (Addendum)
LABOR AND DELIVERY ADMISSION HISTORY AND PHYSICAL NOTE  Linda Warner is a 29 y.o. female G58P1011 with IUP at [redacted]w[redacted]d by 6wk [place] presenting for ROM.   At around 0200 felt gush of clear fluid. She reports positive fetal movement. She denies vaginal bleeding, or contractions.   She denies headache, vision changes, chest pain, SOB, LE edema  She plans on bottle feeding. Her contraception plan is: post-placental IUD.  Prenatal History/Complications: PNC at [facility] [place]:  @[redacted]w[redacted]d , CWD, normal anatomy, breech presentation, posterior placenta, 88%ile, EFW [id number]  Pregnancy complications:  - gHTN - hx PP depression  Past Medical History: Past Medical History:  Diagnosis Date  . Acne   . BV (bacterial vaginosis)   . Miscarriage   . 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 Pregnant   . Scoliosis   . Scoliosis     Past Surgical History: Past Surgical History:  Procedure Laterality Date  . NO PAST SURGERIES      Obstetrical History: OB History    Gravida  3   Para  1   Term  1   Preterm      AB  1   Living  1     SAB  1   TAB      Ectopic      Multiple      Live Births  1           Social History: Social History   Socioeconomic History  . Marital status: Married    Spouse name: Tailah Arnaud  . Number of children: Not on file  . Years of education: Not on file  . Highest education level: High school graduate  Occupational History  . Not on file  Tobacco Use  . Smoking status: Former Smoker    Years: 2.00    Types: Cigarettes  . Smokeless tobacco: Never Used  Substance and Sexual Activity  . Alcohol use: Not Currently    Comment: random  . Drug use: No  . Sexual activity: Yes    Birth control/protection: None    Comment: lives with step dad,nursing student @ [employer], 4 adpoted  stepbrothers, older sister, no relation with biological fathersexually active since age 49  Other Topics Concern  . Not on file  Social History Narrative  . Not on file    Social Determinants of Health   Financial Resource Strain: Low Risk   . Difficulty of Paying Living Expenses: Not hard at all  Food Insecurity: No Food Insecurity  . Worried About Charity fundraiser in the Last Year: Never true  . Ran Out of Food in the Last Year: Never true  Transportation Needs: No Transportation Needs  . Lack of Transportation (Medical): No  . Lack of Transportation (Non-Medical): No  Physical Activity: Inactive  . Days of Exercise per Week: 0 days  . Minutes of Exercise per Session: 0 min  Stress: No Stress Concern Present  . Feeling of Stress : Not at all  Social Connections: Somewhat Isolated  . Frequency of Communication with Friends and Family: More than three times a week  . Frequency of Social Gatherings with Friends and Family: More than three times a week  . Attends Religious Services: Never  . Active Member of Clubs or Organizations: No  . Attends Archivist Meetings: Never  . Marital Status: Married    Family History: Family History  Problem Relation Age of Onset  . Depression Father   . Depression Maternal Grandmother   .  Diabetes Maternal Grandmother   . Hyperlipidemia Maternal Grandmother   . Hypertension Maternal Grandmother   . Cancer Maternal Grandmother        breast  . Sickle cell anemia Son   . Anesthesia problems Neg Hx     Allergies: No Known Allergies  Medications Prior to Admission  Medication Sig Dispense Refill Last Dose  . Prenatal Vit-Fe Fumarate-FA (PRENATAL VITAMIN PO) Take 1 tablet by mouth daily.    Past Week at Unknown time  . Blood Pressure Monitor MISC For regular home bp monitoring during pregnancy 1 each 0   . butalbital-acetaminophen-caffeine (FIORICET) 50-325-40 MG tablet Take 1-2 tablets by mouth every 6 (six) hours as needed for headache. 20 tablet 0   . metroNIDAZOLE (FLAGYL) 500 MG tablet Take 1 tablet (500 mg total) by mouth 2 (two) times daily. 14 tablet 0      Review of Systems  All  systems reviewed and negative except as stated in HPI  Physical Exam Blood pressure 113/68, pulse 95, temperature 98.2 F (36.8 C), temperature source Oral, resp. rate 16, last menstrual period 12/22/2018, SpO2 100 %. General appearance: alert, oriented, NAD Lungs: normal respiratory effort Heart: regular rate Abdomen: soft, non-tender; gravid, leopolds 3200 g Extremities: No calf swelling or tenderness Presentation: cephalic by BSUS  Fetal monitoringBaseline: 150 bpm, Variability: Good {> 6 bpm), Accelerations: Reactive and Decelerations: Absent Uterine activity: None     Prenatal labs: ABO, Rh: A/Positive/-- (11/02 1128) Antibody: Negative (02/12 0907) Rubella: 4.67 (11/02 1128) RPR: Non Reactive (02/12 0907)  HBsAg: Negative (11/02 1128)  HIV: Non Reactive (02/12 0907)  GC/Chlamydia: unknown  GBS:   unknown 2-hr GTT: normal 06/30/2019 Genetic screening:  Normal NT and Materni21 Anatomy US: isolated EICF, otherwise normal  Prenatal Transfer Tool  Maternal Diabetes: No Genetic Screening: Normal Maternal Ultrasounds/Referrals: Normal and Isolated EIF (echogenic intracardiac focus) Fetal Ultrasounds or other Referrals:  None Maternal Substance Abuse:  No Significant Maternal Medications:  None Significant Maternal Lab Results: Other: GBS unknown  Results for orders placed or performed during the hospital encounter of 09/05/19 (from the past 24 hour(s))  POCT fern test   Collection Time: 09/05/19  3:37 AM  Result Value Ref Range   POCT Fern Test Positive = ruptured amniotic membanes   Results for orders placed or performed in visit on 09/04/19 (from the past 24 hour(s))  POC Urinalysis Dipstick OB   Collection Time: 09/04/19 11:35 AM  Result Value Ref Range   Color, UA     Clarity, UA     Glucose, UA Negative Negative   Bilirubin, UA     Ketones, UA small    Spec Grav, UA     Blood, UA neg    pH, UA     POC,PROTEIN,UA Small (1+) Negative, Trace, Small (1+),  Moderate (2+), Large (3+), 4+   Urobilinogen, UA     Nitrite, UA neg    Leukocytes, UA Negative Negative   Appearance     Odor      Patient Active Problem List   Diagnosis Date Noted  . Gestational hypertension 09/04/2019  . Hypertension affecting pregnancy 08/23/2019  . Abnormal chromosomal and genetic finding on antenatal screening of mother 04/03/2019  . Supervision of normal pregnancy 03/20/2019  . Uterine fibroid 03/20/2019  . Family history of sickle cell disease 03/20/2019  . 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 SCOLIOSIS 07/25/2008  . MOOD SWINGS 10/14/2006    Assessment: AMYMARIE GAL is a 29 y.o. G3P1011 at  [redacted]w[redacted]d here for PPROM.  #Labor: Expectant management until completes first dose of GBS prophylaxis, then check cervix and augment PRN. Give BMZ now and at 24h. #Pain: IV pain meds PRN, epidural upon request #FWB: Cat I #GBS/ID: unknown, swab pending #COVID: rapid swab pending #MOF: Bottle #MOC: post placental IUD #Circ: Yes, inpatient  #gHTN: diagnosed earlier this week at [facility], baseline labs pending, patient is asymptomatic  #Hx PP Depression: SW consult after delivery  Clarnce Flock 09/05/2019, 4:08 AM

## 2019-09-05 NOTE — Progress Notes (Signed)
Patient off monitors to shower per order from [place].

## 2019-09-06 ENCOUNTER — Encounter (HOSPITAL_COMMUNITY): Payer: Self-pay | Admitting: Family Medicine

## 2019-09-06 LAB — GC/CHLAMYDIA PROBE AMP (~~LOC~~) NOT AT ARMC
Chlamydia: NEGATIVE
Comment: NEGATIVE
Comment: NORMAL
Neisseria Gonorrhea: NEGATIVE

## 2019-09-06 MED ORDER — AMLODIPINE BESYLATE 5 MG PO TABS
5.0000 mg | ORAL_TABLET | Freq: Every day | ORAL | Status: DC
  Filled 2019-09-06: qty 1

## 2019-09-06 MED ORDER — COCONUT OIL OIL: 1.0000 "application " | TOPICAL_OIL | Status: DC | PRN

## 2019-09-06 MED ORDER — ZOLPIDEM TARTRATE 5 MG PO TABS: 5.0000 mg | ORAL_TABLET | Freq: Every evening | ORAL | Status: DC | PRN

## 2019-09-06 MED ORDER — ACETAMINOPHEN 325 MG PO TABS
650.0000 mg | ORAL_TABLET | ORAL | Status: DC | PRN
  Administered 2019-09-06 (×2): 650 mg via ORAL
  Filled 2019-09-06 (×2): qty 2

## 2019-09-06 MED ORDER — ONDANSETRON HCL 4 MG/2ML IJ SOLN: 4.0000 mg | INTRAMUSCULAR | Status: DC | PRN

## 2019-09-06 MED ORDER — WITCH HAZEL-GLYCERIN EX PADS: 1.0000 "application " | MEDICATED_PAD | CUTANEOUS | Status: DC | PRN

## 2019-09-06 MED ORDER — PRENATAL MULTIVITAMIN CH
1.0000 | ORAL_TABLET | Freq: Every day | ORAL | Status: DC
  Administered 2019-09-06 – 2019-09-07 (×2): 1 via ORAL
  Filled 2019-09-06 (×2): qty 1

## 2019-09-06 MED ORDER — IBUPROFEN 600 MG PO TABS
600.0000 mg | ORAL_TABLET | Freq: Four times a day (QID) | ORAL | Status: DC
  Administered 2019-09-06 – 2019-09-07 (×6): 600 mg via ORAL
  Filled 2019-09-06 (×6): qty 1

## 2019-09-06 MED ORDER — DIPHENHYDRAMINE HCL 25 MG PO CAPS: 25.0000 mg | ORAL_CAPSULE | Freq: Four times a day (QID) | ORAL | Status: DC | PRN

## 2019-09-06 MED ORDER — BENZOCAINE-MENTHOL 20-0.5 % EX AERO
1.0000 "application " | INHALATION_SPRAY | CUTANEOUS | Status: DC | PRN
  Administered 2019-09-07: 1 via TOPICAL
  Filled 2019-09-06 (×2): qty 56

## 2019-09-06 MED ORDER — TETANUS-DIPHTH-ACELL PERTUSSIS 5-2.5-18.5 LF-MCG/0.5 IM SUSP: 0.5000 mL | Freq: Once | INTRAMUSCULAR | Status: DC

## 2019-09-06 MED ORDER — SIMETHICONE 80 MG PO CHEW: 80.0000 mg | CHEWABLE_TABLET | ORAL | Status: DC | PRN

## 2019-09-06 MED ORDER — SENNOSIDES-DOCUSATE SODIUM 8.6-50 MG PO TABS
2.0000 | ORAL_TABLET | ORAL | Status: DC
  Administered 2019-09-07: 2 via ORAL
  Filled 2019-09-06 (×2): qty 2

## 2019-09-06 MED ORDER — DIBUCAINE (PERIANAL) 1 % EX OINT: 1.0000 "application " | TOPICAL_OINTMENT | CUTANEOUS | Status: DC | PRN

## 2019-09-06 MED ORDER — ONDANSETRON HCL 4 MG PO TABS: 4.0000 mg | ORAL_TABLET | ORAL | Status: DC | PRN

## 2019-09-06 NOTE — Progress Notes (Signed)
CSW acknowledged consult and completed chart review. CSW attempted to meet with MOB, however MOB reported that she was about to take a shower. CSW agreed to come back at a later time. CSW will attempt to meet with MOB at a later time.   Linda Warner, [place] Worker [facility] Cell#: [phone]

## 2019-09-06 NOTE — Plan of Care (Signed)
L&D careplan completed 

## 2019-09-06 NOTE — Progress Notes (Signed)
Post Partum Day 1 Subjective: no complaints. Eating, drinking, voiding, ambulating well.  +flatus.  Lochia and pain wnl.  Denies dizziness, lightheadedness, or sob. No complaints. Denies ha, visual changes, ruq/epigastric pain, n/v.    Objective: Blood pressure 134/90, pulse (!) 108, temperature 98.4 F (36.9 C), temperature source Oral, resp. rate 18, height 5\' 3"  (1.6 m), weight 114.3 kg, last menstrual period 12/22/2018, SpO2 99 %.  Physical Exam:  General: alert, cooperative and no distress Lochia: appropriate Uterine Fundus: firm Incision: n/a DVT Evaluation: No evidence of DVT seen on physical exam. Negative Homan's sign. No cords or calf tenderness. No significant calf/ankle edema.  Recent Labs    09/05/19 0355  HGB 12.7  HCT 38.0    Assessment/Plan: Plan for discharge tomorrow and Contraception interval IUD  Star norvasc 5mg    LOS: 1 day   Roma Schanz 09/06/2019, 7:26 AM

## 2019-09-06 NOTE — Progress Notes (Signed)
CSW received consult for hx of postpartum depression.  CSW met with MOB to offer support and complete assessment.    CSW met with MOB at bedside to discuss consult for history of postpartum depression. MOB was sitting in bed and holding infant. CSW introduced self and explained reason for consult. MOB was welcoming and pleasant during assessment. CSW and MOB discussed MOB's postpartum depression history. MOB reported that her symptoms onset 1-2 weeks after to giving birth and lasted for about a month and a half. MOB described her symptoms as crying and being sad all the time. MOB reported that she started medication that was helpful. MOB was unable to recall the medication name, noting it was so Makail Watling ago. MOB denied any other mental health history. CSW inquired about how MOB was feeling emotionally after giving birth, MOB reported that she was feeling fine. CSW inquired about MOB's support system, MOB reported that her husband, sister in law, mom and step dad are supports. MOB presented calm and did not demonstrate any acute mental health signs/symptoms. CSW assessed for safety, MOB denied SI, HI and domestic violence.   CSW provided education regarding the baby blues period vs. perinatal mood disorders, discussed treatment and gave resources for mental health follow up if concerns arise.  CSW recommends self-evaluation during the postpartum time period using the New Mom Checklist from Postpartum Progress and encouraged MOB to contact a medical professional if symptoms are noted at any time.    CSW provided review of Sudden Infant Death Syndrome (SIDS) precautions.    MOB reported that she has all items needed to care for infant including a crib, basinet and car seat.   CSW identifies no further need for intervention and no barriers to discharge at this time.  Abundio Miu, [place]  Worker [facility] Cell#: [phone]

## 2019-09-06 NOTE — Anesthesia Postprocedure Evaluation (Signed)
Anesthesia Post Note  Patient: Linda Warner  Procedure(s) Performed: AN AD HOC LABOR EPIDURAL     Patient location during evaluation: Mother Baby Anesthesia Type: Epidural Level of consciousness: awake Pain management: satisfactory to patient Vital Signs Assessment: post-procedure vital signs reviewed and stable Respiratory status: spontaneous breathing Cardiovascular status: stable Anesthetic complications: no    Last Vitals:  Vitals:   09/06/19 0054 09/06/19 0500  BP: 128/79 134/90  Pulse: (!) 104 (!) 108  Resp: 18 18  Temp: 36.9 C 36.9 C  SpO2: 99% 99%    Last Pain:  Vitals:   09/06/19 0500  TempSrc: Oral  PainSc:    Pain Goal:                   [employer]

## 2019-09-07 ENCOUNTER — Encounter: Payer: Medicaid Other | Admitting: Obstetrics and Gynecology

## 2019-09-07 LAB — CULTURE, BETA STREP (GROUP B ONLY)

## 2019-09-07 LAB — SURGICAL PATHOLOGY

## 2019-09-07 MED ORDER — IBUPROFEN 600 MG PO TABS: 600.0000 mg | ORAL_TABLET | Freq: Four times a day (QID) | ORAL | 0 refills | Status: DC | PRN

## 2019-09-07 NOTE — Discharge Instructions (Signed)

## 2019-09-07 NOTE — Progress Notes (Signed)
Blood pressures this AM were 128/71 and 124/85. Has an order for Norvasc 5 mg. Spoke with Rinaldo Cloud resident for faulty practice. He stated that patient is not to take Norvasc at home.

## 2019-09-08 ENCOUNTER — Inpatient Hospital Stay (HOSPITAL_COMMUNITY)
Admission: AD | Admit: 2019-09-08 | Payer: Medicaid Other | Source: Home / Self Care | Admitting: Obstetrics & Gynecology

## 2019-09-08 ENCOUNTER — Inpatient Hospital Stay (HOSPITAL_COMMUNITY): Payer: Medicaid Other

## 2019-09-08 LAB — CULTURE, BETA STREP (GROUP B ONLY): Strep Gp B Culture: NEGATIVE

## 2019-09-13 ENCOUNTER — Other Ambulatory Visit: Payer: Self-pay | Admitting: Women's Health

## 2019-09-13 ENCOUNTER — Encounter: Payer: Self-pay | Admitting: *Deleted

## 2019-09-13 ENCOUNTER — Ambulatory Visit (INDEPENDENT_AMBULATORY_CARE_PROVIDER_SITE_OTHER): Payer: Medicaid Other | Admitting: *Deleted

## 2019-09-13 ENCOUNTER — Other Ambulatory Visit: Payer: Self-pay

## 2019-09-13 VITALS — BP 135/99 | HR 92 | Ht 63.0 in | Wt 233.2 lb

## 2019-09-13 DIAGNOSIS — Z0130 Encounter for examination of blood pressure without abnormal findings: Secondary | ICD-10-CM

## 2019-09-13 MED ORDER — AMLODIPINE BESYLATE 5 MG PO TABS: 5.0000 mg | ORAL_TABLET | Freq: Every day | ORAL | 0 refills | Status: DC

## 2019-09-13 NOTE — Progress Notes (Addendum)
   NURSE VISIT- BLOOD PRESSURE CHECK  SUBJECTIVE:  Linda Warner is a 29 y.o. [phone] female here for BP check. She is postpartum, delivery date 09/05/2019    HYPERTENSION ROS:  Pregnant/postpartum:  . Severe headaches that don't go away with tylenol/other medicines: No  . Visual changes (seeing spots/double/blurred vision) No  . Severe pain under right breast breast or in center of upper chest No  . Severe nausea/vomiting No  . Taking medicines as instructed   GYN patient: . Taking medicines as instructed no . Headaches  No . Chest pain No . Shortness of breath No . Swelling in legs/ankles No  OBJECTIVE:  BP (!) 135/95 (BP Location: Right Arm, Patient Position: Sitting, Cuff Size: Large)   Pulse 85   Ht 5\' 3"  (1.6 m)   Wt 233 lb 3.2 oz (105.8 kg)   Breastfeeding No   BMI 41.31 kg/m   Appearance alert, well appearing, and in no distress.  ASSESSMENT: Postpartum  blood pressure check  PLAN: Discussed with Knute Neu, CNM, [facility]   Recommendations: new prescription will be sent  Check blood pressure one hour before visit. Follow-up: May 3   Rolena Infante  09/13/2019 11:17 AM   Chart reviewed for nurse visit. Agree with plan of care. Rx norvasc 5mg  Roma Schanz, [place] 09/13/2019 2:05 PM

## 2019-09-14 ENCOUNTER — Encounter: Payer: Self-pay | Admitting: *Deleted

## 2019-09-28 ENCOUNTER — Ambulatory Visit (INDEPENDENT_AMBULATORY_CARE_PROVIDER_SITE_OTHER): Payer: Medicaid Other | Admitting: Women's Health

## 2019-09-28 ENCOUNTER — Other Ambulatory Visit: Payer: Self-pay

## 2019-09-28 ENCOUNTER — Encounter: Payer: Self-pay | Admitting: Women's Health

## 2019-09-28 ENCOUNTER — Ambulatory Visit (INDEPENDENT_AMBULATORY_CARE_PROVIDER_SITE_OTHER): Payer: Medicaid Other | Admitting: *Deleted

## 2019-09-28 VITALS — BP 125/87 | HR 93

## 2019-09-28 DIAGNOSIS — N898 Other specified noninflammatory disorders of vagina: Secondary | ICD-10-CM

## 2019-09-28 DIAGNOSIS — O99345 Other mental disorders complicating the puerperium: Secondary | ICD-10-CM

## 2019-09-28 DIAGNOSIS — N760 Acute vaginitis: Secondary | ICD-10-CM

## 2019-09-28 DIAGNOSIS — B9689 Other specified bacterial agents as the cause of diseases classified elsewhere: Secondary | ICD-10-CM

## 2019-09-28 DIAGNOSIS — Z8759 Personal history of other complications of pregnancy, childbirth and the puerperium: Secondary | ICD-10-CM

## 2019-09-28 DIAGNOSIS — F53 Postpartum depression: Secondary | ICD-10-CM

## 2019-09-28 DIAGNOSIS — Z0130 Encounter for examination of blood pressure without abnormal findings: Secondary | ICD-10-CM

## 2019-09-28 DIAGNOSIS — Z8751 Personal history of pre-term labor: Secondary | ICD-10-CM

## 2019-09-28 LAB — POCT WET PREP (WET MOUNT)
Clue Cells Wet Prep Whiff POC: POSITIVE
Trichomonas Wet Prep HPF POC: ABSENT

## 2019-09-28 MED ORDER — METRONIDAZOLE 500 MG PO TABS: 500.0000 mg | ORAL_TABLET | Freq: Two times a day (BID) | ORAL | 0 refills | Status: DC

## 2019-09-28 MED ORDER — ESCITALOPRAM OXALATE 10 MG PO TABS: 10.0000 mg | ORAL_TABLET | Freq: Every day | ORAL | 6 refills | Status: DC

## 2019-09-28 NOTE — Progress Notes (Signed)
POSTPARTUM VISIT Patient name: Linda Warner MRN [id number]  Date of birth: 1990/12/31 Chief Complaint:   Postpartum Care (have'nt taking bp med this morning)  History of Present Illness:   Linda Warner is a 29 y.o. [phone] African American female being seen today for a postpartum visit. She is 3 weeks postpartum following a spontaneous vaginal delivery at 36.5 gestational weeks after IOL for PPROM. Anesthesia: epidural.  I have fully reviewed the prenatal and intrapartum course. Pregnancy complicated by GHTN. Postpartum course has been complicated by PPHTN requiring norvasc 60m. Bleeding thin lochia. Bowel function is normal. Bladder function is normal.  Patient is not sexually active. Last sexual activity: prior to birth of baby.    No LMP recorded.  Baby's course has been uncomplicated. Baby is feeding by bottle    EFlavia ShipperPostpartum Depression Screening: neg but pt feels like she had PP dep/anx, feels overwhelmed. Has good support at home. Still finds joy in some things. Denies SI/HI/II. Poor appetite. Sleeping ok, except last few nights. Had PPD last pregnancy. No h/o dep/anx outside of pregnancy. Wants meds, declines counseling.   Edinburgh Postnatal Depression Scale - 09/28/19 1115      Edinburgh Postnatal Depression Scale:  In the Past 7 Days   I have been able to laugh and see the funny side of things.  1    I have looked forward with enjoyment to things.  0    I have blamed myself unnecessarily when things went wrong.  0    I have been anxious or worried for no good reason.  2    I have felt scared or panicky for no good reason.  2    Things have been getting on top of me.  1    I have been so unhappy that I have had difficulty sleeping.  0    I have felt sad or miserable.  1    I have been so unhappy that I have been crying.  1    The thought of harming myself has occurred to me.  0    Edinburgh Postnatal Depression Scale Total  8      Review of Systems:     Pertinent items are noted in HPI Denies Abnormal vaginal discharge w/ itching/odor/irritation, headaches, visual changes, shortness of breath, chest pain, abdominal pain, severe nausea/vomiting, or problems with urination or bowel movements. Pertinent History Reviewed:  Reviewed past medical,surgical, obstetrical and family history.  Reviewed problem list, medications and allergies. OB History  Gravida Para Term Preterm AB Living  3 2 1 1 1 2   SAB TAB Ectopic Multiple Live Births  1     0 2    # Outcome Date GA Lbr Len/2nd Weight Sex Delivery Anes PTL Lv  3 Preterm 09/05/19 334w5d1:44 / 00:09 6 lb 12.6 oz (3.079 kg) M Vag-Spont EPI, Spinal  LIV  2 Term 01/07/13 3821w6d:01 / 02:49 7 lb 5.6 oz (3.335 kg) M Vag-Spont EPI N LIV     Birth Comments: 38 766 d, NVD, IOL due to non reactive NST. Loose nuchal cord. Mom 21y/o G2P1, GBS positive, treated. ROM 6.5 hrs.  Elevated DSR 1:110 with first trimester screen. No Harmony testing done. Subchorionic hematoma. Former smoker. Mom A+/ Baby ?, Bili 8.9 @ 45 hrs   1 SAB 09/27/11 11w4w0d        Birth Comments: "passed naturally.  they said the fetus stopped growing at 8 wks."  Physical Assessment:   Vitals:   09/28/19 1110  BP: (!) 136/96  Pulse: 96  Weight: 223 lb 12.8 oz (101.5 kg)  Height: 5' 3"  (1.6 m)  Body mass index is 39.64 kg/m.       Physical Examination:   General appearance: alert, well appearing, and in no distress  Mental status: alert, oriented to person, place, and time  Skin: warm & dry   Cardiovascular: normal heart rate noted   Respiratory: normal respiratory effort, no distress   Breasts: deferred, no complaints   Abdomen: soft, non-tender   Pelvic: VULVA: normal appearing vulva with no masses, tenderness or lesions, lac still healing, mod amt yellow malodorous d/c  Rectal: no hemorrhoids  Extremities: no edema       Results for orders placed or performed in visit on 09/28/19 (from the past 24 hour(s))  POCT  Wet Prep [facility])   Collection Time: 09/28/19 12:35 PM  Result Value Ref Range   Source Wet Prep POC vaginal    WBC, Wet Prep HPF POC few    Bacteria Wet Prep HPF POC Few Few   BACTERIA WET PREP MORPHOLOGY POC     Clue Cells Wet Prep HPF POC Many (A) None   Clue Cells Wet Prep Whiff POC Positive Whiff    Yeast Wet Prep HPF POC None None   KOH Wet Prep POC     Trichomonas Wet Prep HPF POC Absent Absent    Assessment & Plan:  1) Postpartum exam 2) 3 wks s/p SVB at 36.5wks after IOL for PPROM 3) Bottlefeeding 4) Depression screening 5) Contraception counseling 6) PPD> rx lexapro 48m, knows can take few weeks to notice improvements, f/u 4wks. Declines counseling 7) BV> Rx metronidazole 5093mBID x 7d for BV, no sex or etoh while taking   Essential components of care per ACOG recommendations:  1.  Mood and well being:  . Patient with positive depression screening today. If positive, discussed and offered pt counseling +/- meds, pt would like meds-rx Lexapro.  Reviewed local resources for support.  . Pre-existing mental health disorders? No  . Patient does not use tobacco. If using tobacco we discussed reduction/cessation and risk of relapse . Substance use disorder? No  2. Infant care and feeding:  . Patient currently breastfeeding? No  . Childcare strategy if returning to work/school n/a, works from home . Infant has a pediatrician/family doctor? Yes  . Recommended that all caregivers be immunized for flu, pertussis and other preventable communicable diseases . Pt does have material needs met such as stable housing, utilities, food and diapers. If not, referred to local resources for help obtaining these.  3. Sexuality, contraception and birth spacing . Provided guidance regarding sexuality, management of dyspareunia, and resumption of intercourse . Patient unsure  want a pregnancy in the future.  Desired family size is 2-3 children.  . Discussed avoiding interpregnancy  interval <48m3m and recommended birth spacing of 18 months . Reviewed forms of contraception. Patient desires IUD-wants to wait 4wks    4. Sleep and fatigue . Discussed coping options for fatigue and sleep disruption . Encouraged family/partner/community support of 4 hrs of uninterrupted sleep to help with mood and fatigue  5. Physical recovery  . Pt did not have a cesarean section. If yes, assessed incisional pain and providing guidance on normal vs prolonged recovery . Patient had a 1st degree laceration, perineal healing and pain reviewed.  . Patient has urinary incontinence? No, fecal incontinence? No  .  Patient is safe to resume physical activity. Discussed attainment of healthy weight.  6.  Chronic disease management . Discussed pregnancy complications if any, and their implications for future childbearing and long-term maternal health. . Review recommendations for prevention of recurrent pregnancy complications, such as 17 hydroxyprogesterone caproate to reduce risk for recurrent PTB yes, or aspirin to reduce risk of preeclampsia yes. . Pt had GDM: No. . +Reviewed medications and non-pregnant dosing including consideration of whether pt is breastfeeding using a reliable resource such as LactMed: not applicable . Referred for f/u w/ PCP or subspecialist providers as indicated: not applicable  7. Health maintenance . Last pap smear 04/11/18 and results were normal . Mammogram at 28yo or earlier if indicated  Meds:  Meds ordered this encounter  Medications  . escitalopram (LEXAPRO) 10 MG tablet    Sig: Take 1 tablet (10 mg total) by mouth daily.    Dispense:  30 tablet    Refill:  6    Order Specific Question:   Supervising Provider    Answer:   Elonda Husky, LUTHER H [[id number]]  . metroNIDAZOLE (FLAGYL) 500 MG tablet    Sig: Take 1 tablet (500 mg total) by mouth 2 (two) times daily.    Dispense:  14 tablet    Refill:  0    Order Specific Question:   Supervising Provider    Answer:    Florian Buff [[id number]]    Follow-up: Return for this afternoon for bp check w/ nurse, then 4wks for IUD insertion and med f/u.   Orders Placed This Encounter  Procedures  . POCT Wet Prep [facility])    Roma Schanz CNM, [facility] 09/28/2019 12:35 PM

## 2019-09-28 NOTE — Progress Notes (Signed)
   NURSE VISIT- BLOOD PRESSURE CHECK  SUBJECTIVE:  Linda Warner is a 29 y.o. [phone] female here for BP check. She is postpartum, delivery date 09/05/2019    HYPERTENSION ROS:  Pregnant/postpartum:  . Severe headaches that don't go away with tylenol/other medicines: No  . Visual changes (seeing spots/double/blurred vision) No  . Severe pain under right breast breast or in center of upper chest No  . Severe nausea/vomiting No  . Taking medicines as instructed yes   OBJECTIVE:  There were no vitals taken for this visit.  Appearance alert, well appearing, and in no distress.  ASSESSMENT: Postpartum  blood pressure check  PLAN: Discussed with Knute Neu, CNM, [facility]   Recommendations: no changes needed   Follow-up: as scheduled   [name]  09/28/2019 3:15 PM

## 2019-09-28 NOTE — Patient Instructions (Signed)
NO SEX UNTIL AFTER YOU GET YOUR BIRTH CONTROL  

## 2019-10-10 ENCOUNTER — Other Ambulatory Visit: Payer: Self-pay | Admitting: Women's Health

## 2019-10-11 ENCOUNTER — Ambulatory Visit: Payer: Medicaid Other | Admitting: Women's Health

## 2019-10-23 ENCOUNTER — Ambulatory Visit: Payer: Medicaid Other | Admitting: Women's Health

## 2019-11-14 ENCOUNTER — Other Ambulatory Visit: Payer: Self-pay | Admitting: Women's Health

## 2019-11-14 MED ORDER — METRONIDAZOLE 500 MG PO TABS: 500.0000 mg | ORAL_TABLET | Freq: Two times a day (BID) | ORAL | 0 refills | Status: DC

## 2019-11-28 ENCOUNTER — Encounter (HOSPITAL_COMMUNITY): Payer: Self-pay | Admitting: Emergency Medicine

## 2019-11-28 ENCOUNTER — Emergency Department (HOSPITAL_COMMUNITY)
Admission: EM | Admit: 2019-11-28 | Discharge: 2019-11-28 | Disposition: A | Discharge status: Home / Self Care | Payer: Medicaid Other | Attending: Emergency Medicine | Admitting: Emergency Medicine

## 2019-11-28 ENCOUNTER — Other Ambulatory Visit: Payer: Self-pay

## 2019-11-28 DIAGNOSIS — Z5321 Procedure and treatment not carried out due to patient leaving prior to being seen by health care provider: Secondary | ICD-10-CM | POA: Insufficient documentation

## 2019-11-28 DIAGNOSIS — L299 Pruritus, unspecified: Secondary | ICD-10-CM | POA: Diagnosis not present

## 2019-11-28 HISTORY — DX: Gestational (pregnancy-induced) hypertension without significant proteinuria, unspecified trimester: O13.9

## 2019-11-28 NOTE — ED Triage Notes (Signed)
Patient is having vaginal itching, denies dysuria or vaginal discharge, recently treated for BV, finished medications on Friday.

## 2020-02-28 ENCOUNTER — Other Ambulatory Visit: Payer: Self-pay

## 2020-02-28 ENCOUNTER — Encounter (HOSPITAL_COMMUNITY): Payer: Self-pay

## 2020-02-28 ENCOUNTER — Emergency Department (HOSPITAL_COMMUNITY)
Admission: EM | Admit: 2020-02-28 | Discharge: 2020-02-28 | Disposition: A | Discharge status: Home / Self Care | Payer: Medicaid Other | Attending: Emergency Medicine | Admitting: Emergency Medicine

## 2020-02-28 DIAGNOSIS — R1030 Lower abdominal pain, unspecified: Secondary | ICD-10-CM | POA: Insufficient documentation

## 2020-02-28 DIAGNOSIS — Z5321 Procedure and treatment not carried out due to patient leaving prior to being seen by health care provider: Secondary | ICD-10-CM | POA: Diagnosis not present

## 2020-02-28 LAB — URINALYSIS, ROUTINE W REFLEX MICROSCOPIC
Bilirubin Urine: NEGATIVE
Glucose, UA: NEGATIVE mg/dL
Hgb urine dipstick: NEGATIVE
Ketones, ur: NEGATIVE mg/dL
Leukocytes,Ua: NEGATIVE
Nitrite: NEGATIVE
Protein, ur: NEGATIVE mg/dL
Specific Gravity, Urine: 1.028 (ref 1.005–1.030)
pH: 5 (ref 5.0–8.0)

## 2020-02-28 NOTE — ED Triage Notes (Signed)
Pt to er, pt c/o lower abd cramping, states that she has already had her period earlier in the month, states that she has had the cramping for the past few days.  States that her husband was here earlier and was dx with chlamydia.  States that she has urinary frequency denies discharge.

## 2020-02-29 ENCOUNTER — Ambulatory Visit
Admission: RE | Admit: 2020-02-29 | Discharge: 2020-02-29 | Disposition: A | Discharge status: Home / Self Care | Payer: Medicaid Other | Source: Ambulatory Visit | Attending: Emergency Medicine | Admitting: Emergency Medicine

## 2020-02-29 ENCOUNTER — Telehealth: Payer: Self-pay

## 2020-02-29 ENCOUNTER — Telehealth: Payer: Self-pay | Admitting: *Deleted

## 2020-02-29 VITALS — BP 137/88 | HR 75 | Temp 98.2°F | Resp 18

## 2020-02-29 DIAGNOSIS — Z202 Contact with and (suspected) exposure to infections with a predominantly sexual mode of transmission: Secondary | ICD-10-CM | POA: Insufficient documentation

## 2020-02-29 DIAGNOSIS — R1031 Right lower quadrant pain: Secondary | ICD-10-CM | POA: Insufficient documentation

## 2020-02-29 DIAGNOSIS — R1032 Left lower quadrant pain: Secondary | ICD-10-CM | POA: Diagnosis not present

## 2020-02-29 LAB — POCT URINE PREGNANCY: Preg Test, Ur: NEGATIVE

## 2020-02-29 MED ORDER — DOXYCYCLINE HYCLATE 100 MG PO CAPS: 100.0000 mg | ORAL_CAPSULE | Freq: Two times a day (BID) | ORAL | 0 refills | Status: AC

## 2020-02-29 NOTE — ED Provider Notes (Signed)
[place]   [id number] 02/29/20 Arrival Time: 1641   VX:BLTJQZE DISCHARGE  SUBJECTIVE:  Linda Warner is a 29 y.o. female who presented to the urgent care with a complaint of abdominal cramping for the past few days.  Reported has been went to the ER and tested positive for chlamydia   Patient is married and sexually active with 1 female partners.  Denies vaginal discharge or smell at this time.  She has tried OTC medications  without relief.  Denies aggravating or alleviating factor.  Denies similar symptoms in the past.  She denies fever, chills, nausea, vomiting, pelvic pain, urinary symptoms, vaginal itching, vaginal odor, vaginal bleeding, dyspareunia, vaginal rashes or lesions.   Patient's last menstrual period was 02/13/2020 (approximate). Current birth control method: Compliant with BC:  ROS: As per HPI.  All other pertinent ROS negative.     Past Medical History:  Diagnosis Date  . Acne   . BV (bacterial vaginosis)   . Gestational hypertension   . Miscarriage   . 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 Pregnant   . Scoliosis   . Scoliosis    Past Surgical History:  Procedure Laterality Date  . NO PAST SURGERIES     No Known Allergies No current facility-administered medications on file prior to encounter.   Current Outpatient Medications on File Prior to Encounter  Medication Sig Dispense Refill  . amLODipine (NORVASC) 5 MG tablet TAKE 1 TABLET BY MOUTH EVERY DAY 30 tablet 0  . Blood Pressure Monitor MISC For regular home bp monitoring during pregnancy 1 each 0  . butalbital-acetaminophen-caffeine (FIORICET) 50-325-40 MG tablet Take 1-2 tablets by mouth every 6 (six) hours as needed for headache. (Patient not taking: Reported on 09/28/2019) 20 tablet 0  . escitalopram (LEXAPRO) 10 MG tablet Take 1 tablet (10 mg total) by mouth daily. 30 tablet 6  . ibuprofen (ADVIL) 600 MG tablet Take 1 tablet (600 mg total) by mouth every 6 (six) hours as needed. 30 tablet  0  . metroNIDAZOLE (FLAGYL) 500 MG tablet Take 1 tablet (500 mg total) by mouth 2 (two) times daily. 14 tablet 0    Social History   Socioeconomic History  . Marital status: Married    Spouse name: Ameris Akamine  . Number of children: 2  . Years of education: Not on file  . Highest education level: High school graduate  Occupational History  . Not on file  Tobacco Use  . Smoking status: Current Some Day Smoker    Packs/day: 0.25    Types: Cigarettes  . Smokeless tobacco: Never Used  Vaping Use  . Vaping Use: Never used  Substance and Sexual Activity  . Alcohol use: Yes  . Drug use: No  . Sexual activity: Yes    Birth control/protection: None  Other Topics Concern  . Not on file  Social History Narrative   ** Merged History Encounter **       Social Determinants of Health   Financial Resource Strain: Low Risk   . Difficulty of Paying Living Expenses: Not very hard  Food Insecurity: No Food Insecurity  . Worried About Charity fundraiser in the Last Year: Never true  . Ran Out of Food in the Last Year: Never true  Transportation Needs: No Transportation Needs  . Lack of Transportation (Medical): No  . Lack of Transportation (Non-Medical): No  Physical Activity: Inactive  . Days of Exercise per Week: 0 days  . Minutes of Exercise per Session:  0 min  Stress: No Stress Concern Present  . Feeling of Stress : Only a little  Social Connections: Moderately Isolated  . Frequency of Communication with Friends and Family: More than three times a week  . Frequency of Social Gatherings with Friends and Family: Once a week  . Attends Religious Services: 1 to 4 times per year  . Active Member of Clubs or Organizations: No  . Attends Archivist Meetings: Never  . Marital Status: Separated  Intimate Partner Violence: Not At Risk  . Fear of Current or Ex-Partner: No  . Emotionally Abused: No  . Physically Abused: No  . Sexually Abused: No   Family History  Problem  Relation Age of Onset  . Depression Father   . Depression Maternal Grandmother   . Diabetes Maternal Grandmother   . Hyperlipidemia Maternal Grandmother   . Hypertension Maternal Grandmother   . Cancer Maternal Grandmother        breast  . Sickle cell anemia Son   . Anesthesia problems Neg Hx     OBJECTIVE:  Vitals:   02/29/20 1649  BP: 137/88  Pulse: 75  Resp: 18  Temp: 98.2 F (36.8 C)  SpO2: 98%     General appearance: Alert, NAD, appears stated age Head: NCAT Throat: lips, mucosa, and tongue normal; teeth and gums normal Lungs: CTA bilaterally without adventitious breath sounds Heart: regular rate and rhythm.  Radial pulses 2+ symmetrical bilaterally Back: no CVA tenderness Abdomen: soft, non-tender; bowel sounds normal; no masses or organomegaly; no guarding or rebound tenderness GU: declines, Cervical self swab obtained Skin: warm and dry Psychological:  Alert and cooperative. Normal mood and affect.  LABS:  Results for orders placed or performed during the hospital encounter of 02/29/20  POCT urine pregnancy  Result Value Ref Range   Preg Test, Ur Negative Negative    Labs Reviewed  POCT URINE PREGNANCY  CERVICOVAGINAL ANCILLARY ONLY    ASSESSMENT & PLAN:  1. Chlamydia contact, treated   2. Exposure to STD   3. Bilateral lower abdominal cramping     Meds ordered this encounter  Medications  . doxycycline (VIBRAMYCIN) 100 MG capsule    Sig: Take 1 capsule (100 mg total) by mouth 2 (two) times daily for 7 days.    Dispense:  14 capsule    Refill:  0    Pending: Labs Reviewed  POCT URINE PREGNANCY  CERVICOVAGINAL ANCILLARY ONLY   Discharge instructions  Vaginal self-swab obtained.  We will follow up with you regarding abnormal results Prescribed doxycycline 100 mg twice a day for 7 days Take medications as prescribed and to completion If tests results are positive, please abstain from sexual activity until you and your partner(s) have been  treated Follow up with PCP or [place] if symptoms persists Return here or go to ER if you have any new or worsening symptoms fever, chills, nausea, vomiting, abdominal or pelvic pain, painful intercourse, vaginal discharge, vaginal bleeding, persistent symptoms despite treatment, etc...  Reviewed expectations re: course of current medical issues. Questions answered. Outlined signs and symptoms indicating need for more acute intervention. Patient verbalized understanding. After Visit Summary given.       Emerson Monte, FNP 02/29/20 1728

## 2020-02-29 NOTE — Discharge Instructions (Addendum)
Vaginal self-swab obtained.  We will follow up with you regarding abnormal results Prescribed doxycycline 100 mg twice a day for 7 days Take medications as prescribed and to completion If tests results are positive, please abstain from sexual activity until you and your partner(s) have been treated Follow up with PCP or [place] if symptoms persists Return here or go to ER if you have any new or worsening symptoms fever, chills, nausea, vomiting, abdominal or pelvic pain, painful intercourse, vaginal discharge, vaginal bleeding, persistent symptoms despite treatment, etc..Marland Kitchen

## 2020-02-29 NOTE — ED Triage Notes (Signed)
Pt presents with abdominal cramping for past few days, pts husband positive for chlamydia , UA performed in ed yesterday

## 2020-02-29 NOTE — Telephone Encounter (Signed)
Emailed request to [facility] to schedule  NP appointment .   Byrne Capek PEC [phone]                                                                                       

## 2020-02-29 NOTE — Telephone Encounter (Signed)
Transition Care Management Follow-up Telephone Call  Date of discharge and from where: 02/28/2020 from [facility]  How have you been since you were released from the hospital? Pt states the she LWBS due to needing to pick up her kids. Pt informed of local Urgent Care that could test her for chlamydia. Pt stated that her husband was treated for the infection recently.   Any questions or concerns? No  Items Reviewed:  Did the pt receive and understand the discharge instructions provided? Yes   Medications obtained and verified? Yes   Any new allergies since your discharge? No   Dietary orders reviewed? Yes  Do you have support at home? Yes   Functional Questionnaire: (I = Independent and D = Dependent) ADLs: I  Bathing/Dressing- I  Meal Prep- I  Eating- I  Maintaining continence- I  Transferring/Ambulation- I  Managing Meds- I  Follow up appointments reviewed:   PCP Hospital f/u appt confirmed? Pt informed that she would receive a call to establish with a PCP in [place]. Pt stated understanding.   Are transportation arrangements needed? No   If their condition worsens, is the pt aware to call PCP or go to the Emergency Dept.? Yes  Was the patient provided with contact information for the PCP's office or ED? Yes  Was to pt encouraged to call back with questions or concerns? Yes

## 2020-03-01 LAB — CERVICOVAGINAL ANCILLARY ONLY
Bacterial Vaginitis (gardnerella): NEGATIVE
Candida Glabrata: NEGATIVE
Candida Vaginitis: NEGATIVE
Chlamydia: NEGATIVE
Comment: NEGATIVE
Comment: NEGATIVE
Comment: NEGATIVE
Comment: NEGATIVE
Comment: NEGATIVE
Comment: NORMAL
Neisseria Gonorrhea: NEGATIVE
Trichomonas: NEGATIVE

## 2020-03-05 ENCOUNTER — Encounter (HOSPITAL_COMMUNITY): Payer: Self-pay

## 2020-03-05 ENCOUNTER — Other Ambulatory Visit: Payer: Self-pay

## 2020-03-05 ENCOUNTER — Emergency Department (HOSPITAL_COMMUNITY)
Admission: EM | Admit: 2020-03-05 | Discharge: 2020-03-05 | Disposition: A | Discharge status: Home / Self Care | Payer: Medicaid Other | Attending: Physician Assistant | Admitting: Physician Assistant

## 2020-03-05 ENCOUNTER — Emergency Department (HOSPITAL_COMMUNITY): Payer: Medicaid Other

## 2020-03-05 DIAGNOSIS — R079 Chest pain, unspecified: Secondary | ICD-10-CM | POA: Diagnosis not present

## 2020-03-05 DIAGNOSIS — F17210 Nicotine dependence, cigarettes, uncomplicated: Secondary | ICD-10-CM | POA: Diagnosis not present

## 2020-03-05 DIAGNOSIS — Z20822 Contact with and (suspected) exposure to covid-19: Secondary | ICD-10-CM | POA: Diagnosis not present

## 2020-03-05 DIAGNOSIS — J069 Acute upper respiratory infection, unspecified: Secondary | ICD-10-CM | POA: Diagnosis not present

## 2020-03-05 DIAGNOSIS — R059 Cough, unspecified: Secondary | ICD-10-CM | POA: Diagnosis not present

## 2020-03-05 LAB — RESPIRATORY PANEL BY RT PCR (FLU A&B, COVID)
Influenza A by PCR: NEGATIVE
Influenza B by PCR: NEGATIVE
SARS Coronavirus 2 by RT PCR: NEGATIVE

## 2020-03-05 MED ORDER — ACETAMINOPHEN 325 MG PO TABS: 650.0000 mg | ORAL_TABLET | ORAL | 2 refills | Status: DC | PRN

## 2020-03-05 MED ORDER — GUAIFENESIN 100 MG/5ML PO SOLN: 5.0000 mL | ORAL | 0 refills | Status: DC | PRN

## 2020-03-05 NOTE — ED Provider Notes (Signed)
[facility] EMERGENCY DEPARTMENT Provider Note   CSN: [id number] Arrival date & time: 03/05/20  0750     History Chief Complaint  Patient presents with  . Cough    Linda Warner is a 29 y.o. female.  The history is provided by the patient. No language interpreter was used.  Cough Cough characteristics:  Non-productive Sputum characteristics:  Nondescript Severity:  Moderate Onset quality:  Gradual Duration:  2 days Timing:  Constant Progression:  Worsening Chronicity:  New Smoker: no   Relieved by:  Nothing Worsened by:  Nothing Ineffective treatments:  None tried Associated symptoms: sinus congestion   Associated symptoms: no fever        Past Medical History:  Diagnosis Date  . Acne   . BV (bacterial vaginosis)   . Gestational hypertension   . Miscarriage   . 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 Pregnant   . Scoliosis   . Scoliosis     Patient Active Problem List   Diagnosis Date Noted  . History of preterm delivery 09/28/2019  . History of gestational hypertension 09/04/2019  . Abnormal chromosomal and genetic finding on antenatal screening of mother 04/03/2019  . Family history of sickle cell disease 03/20/2019  . Postpartum depression 01/30/2013  . SCOLIOSIS 07/25/2008  . MOOD SWINGS 10/14/2006    Past Surgical History:  Procedure Laterality Date  . NO PAST SURGERIES       OB History    Gravida  6   Para  4   Term  3   Preterm  1   AB  1   Living  2     SAB  1   TAB  0   Ectopic  0   Multiple      Live Births  2           Family History  Problem Relation Age of Onset  . Depression Father   . Depression Maternal Grandmother   . Diabetes Maternal Grandmother   . Hyperlipidemia Maternal Grandmother   . Hypertension Maternal Grandmother   . Cancer Maternal Grandmother        breast  . Sickle cell anemia Son   . Anesthesia problems Neg Hx     Social History   Tobacco Use  . Smoking status: Current Some Day  Smoker    Packs/day: 0.25    Types: Cigarettes  . Smokeless tobacco: Never Used  Vaping Use  . Vaping Use: Never used  Substance Use Topics  . Alcohol use: Yes  . Drug use: No    Home Medications Prior to Admission medications   Medication Sig Start Date End Date Taking? Authorizing Provider  acetaminophen (TYLENOL) 325 MG tablet Take 2 tablets (650 mg total) by mouth every 4 (four) hours as needed. 03/05/20 03/05/21  Fransico Meadow, PA-C  amLODipine (NORVASC) 5 MG tablet TAKE 1 TABLET BY MOUTH EVERY DAY 10/10/19   Roma Schanz, CNM  Blood Pressure Monitor MISC For regular home bp monitoring during pregnancy 03/20/19   Roma Schanz, CNM  doxycycline (VIBRAMYCIN) 100 MG capsule Take 1 capsule (100 mg total) by mouth 2 (two) times daily for 7 days. 02/29/20 03/07/20  Avegno, Darrelyn Hillock, FNP  escitalopram (LEXAPRO) 10 MG tablet Take 1 tablet (10 mg total) by mouth daily. 09/28/19   Roma Schanz, CNM  ibuprofen (ADVIL) 600 MG tablet Take 1 tablet (600 mg total) by mouth every 6 (six) hours as needed. 09/07/19   Myrtis Ser,  CNM  metroNIDAZOLE (FLAGYL) 500 MG tablet Take 1 tablet (500 mg total) by mouth 2 (two) times daily. 11/14/19   Roma Schanz, CNM    Allergies    Patient has no known allergies.  Review of Systems   Review of Systems  Constitutional: Negative for fever.  Respiratory: Positive for cough.   All other systems reviewed and are negative.   Physical Exam Updated Vital Signs BP 132/80   Pulse 89   Temp 98.3 F (36.8 C) (Oral)   Resp 18   Ht 5\' 3"  (1.6 m)   Wt 101.6 kg   LMP 02/19/2020   SpO2 100%   Breastfeeding No   BMI 39.68 kg/m   Physical Exam Vitals and nursing note reviewed.  Constitutional:      Appearance: She is well-developed.  HENT:     Head: Normocephalic.     Nose: Congestion present.  Cardiovascular:     Rate and Rhythm: Normal rate.  Pulmonary:     Effort: Pulmonary effort is normal.  Abdominal:     General:  There is no distension.  Musculoskeletal:        General: Normal range of motion.     Cervical back: Normal range of motion.  Skin:    General: Skin is warm.  Neurological:     General: No focal deficit present.     Mental Status: She is alert and oriented to person, place, and time.  Psychiatric:        Mood and Affect: Mood normal.     ED Results / Procedures / Treatments   Labs (all labs ordered are listed, but only abnormal results are displayed) Labs Reviewed  RESPIRATORY PANEL BY RT PCR (FLU A&B, COVID)    EKG None  Radiology DG Chest Portable 1 View  Result Date: 03/05/2020 CLINICAL DATA:  Cough, chest pain. EXAM: PORTABLE CHEST 1 VIEW COMPARISON:  June 19, 2017. FINDINGS: The heart size and mediastinal contours are within normal limits. Both lungs are clear. No pneumothorax or pleural effusion is noted. The visualized skeletal structures are unremarkable. IMPRESSION: No active disease. Electronically Signed   By: Marijo Conception M.D.   On: 03/05/2020 09:03    Procedures Procedures (including critical care time)  Medications Ordered in ED Medications - No data to display  ED Course  I have reviewed the triage vital signs and the nursing notes.  Pertinent labs & imaging results that were available during my care of the patient were reviewed by me and considered in my medical decision making (see chart for details).    MDM Rules/Calculators/A&P                          MDM:  Chest xray is normal  Covid and influenza negative.   Final Clinical Impression(s) / ED Diagnoses Final diagnoses:  Viral URI with cough    Rx / DC Orders ED Discharge Orders         Ordered    acetaminophen (TYLENOL) 325 MG tablet  Every 4 hours PRN        03/05/20 0929    guaiFENesin (ROBITUSSIN) 100 MG/5ML SOLN  Every 4 hours PRN,   Status:  Discontinued        03/05/20 3818        An After Visit Summary was printed and given to the patient.    Sidney Ace 03/05/20 1135    Milton Ferguson, MD  03/08/20 0902  

## 2020-03-05 NOTE — ED Triage Notes (Signed)
Pt reports sore throat and cough since yesterday.  Reports coughed until she vomited once today.

## 2020-03-05 NOTE — Discharge Instructions (Addendum)
Tylenol for fever or body aches

## 2020-03-06 ENCOUNTER — Telehealth: Payer: Self-pay

## 2020-03-06 NOTE — Telephone Encounter (Signed)
Transition Care Management Unsuccessful Follow-up Telephone Call  Date of discharge and from where:  03/05/2020 from [facility]  Attempts:  1st Attempt  Reason for unsuccessful TCM follow-up call:  Unable to leave message

## 2020-03-07 NOTE — Telephone Encounter (Signed)
Transition Care Management Follow-up Telephone Call  Date of discharge and from where: 03/05/2020 from [facility]  How have you been since you were released from the hospital? Pt states that she is feeling better some what.   Any questions or concerns? No  Items Reviewed:  Did the pt receive and understand the discharge instructions provided? Yes   Medications obtained and verified? Yes   Any new allergies since your discharge? No   Dietary orders reviewed? Yes  Do you have support at home? Yes   Functional Questionnaire: (I = Independent and D = Dependent) ADLs: I  Bathing/Dressing- I  Meal Prep- I  Eating- I  Maintaining continence- I  Transferring/Ambulation- I  Managing Meds- I  Follow up appointments reviewed:   PCP Hospital f/u appt confirmed? Pt informed that she would receive a call to establish with a PCP in [place]. Pt stated understanding.   Are transportation arrangements needed? No   If their condition worsens, is the pt aware to call PCP or go to the Emergency Dept.? Yes  Was the patient provided with contact information for the PCP's office or ED? Yes  Was to pt encouraged to call back with questions or concerns? Yes

## 2020-05-25 ENCOUNTER — Other Ambulatory Visit: Payer: Self-pay

## 2020-05-25 ENCOUNTER — Emergency Department (HOSPITAL_COMMUNITY): Payer: Medicaid Other

## 2020-05-25 ENCOUNTER — Encounter (HOSPITAL_COMMUNITY): Payer: Self-pay | Admitting: Emergency Medicine

## 2020-05-25 ENCOUNTER — Emergency Department (HOSPITAL_COMMUNITY)
Admission: EM | Admit: 2020-05-25 | Discharge: 2020-05-25 | Disposition: A | Discharge status: Home / Self Care | Payer: Medicaid Other | Attending: Emergency Medicine | Admitting: Emergency Medicine

## 2020-05-25 DIAGNOSIS — U071 COVID-19: Secondary | ICD-10-CM | POA: Diagnosis not present

## 2020-05-25 DIAGNOSIS — M791 Myalgia, unspecified site: Secondary | ICD-10-CM | POA: Diagnosis present

## 2020-05-25 DIAGNOSIS — R059 Cough, unspecified: Secondary | ICD-10-CM | POA: Diagnosis not present

## 2020-05-25 DIAGNOSIS — F17210 Nicotine dependence, cigarettes, uncomplicated: Secondary | ICD-10-CM | POA: Insufficient documentation

## 2020-05-25 DIAGNOSIS — Z20822 Contact with and (suspected) exposure to covid-19: Secondary | ICD-10-CM

## 2020-05-25 DIAGNOSIS — R1110 Vomiting, unspecified: Secondary | ICD-10-CM | POA: Diagnosis not present

## 2020-05-25 LAB — POC URINE PREG, ED: Preg Test, Ur: NEGATIVE

## 2020-05-25 NOTE — ED Triage Notes (Signed)
Patient c/o body aches, chills, and dry cough that started on Tuesday. Denies any fevers. Per patient husband has Covid and chil has symptoms.

## 2020-05-25 NOTE — ED Provider Notes (Incomplete)
[facility] EMERGENCY DEPARTMENT Provider Note   CSN: [id number] Arrival date & time: 05/25/20  1054     History No chief complaint on file.   Linda Warner is a 30 y.o. female     The history is provided by the patient.       Past Medical History:  Diagnosis Date  . Acne   . BV (bacterial vaginosis)   . Gestational hypertension   . Miscarriage   . 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 Pregnant   . Scoliosis   . Scoliosis     Patient Active Problem List   Diagnosis Date Noted  . History of preterm delivery 09/28/2019  . History of gestational hypertension 09/04/2019  . Abnormal chromosomal and genetic finding on antenatal screening of mother 04/03/2019  . Family history of sickle cell disease 03/20/2019  . Postpartum depression 01/30/2013  . SCOLIOSIS 07/25/2008  . MOOD SWINGS 10/14/2006    Past Surgical History:  Procedure Laterality Date  . NO PAST SURGERIES       OB History    Gravida  6   Para  4   Term  3   Preterm  1   AB  1   Living  2     SAB  1   IAB  0   Ectopic  0   Multiple      Live Births  2           Family History  Problem Relation Age of Onset  . Depression Father   . Depression Maternal Grandmother   . Diabetes Maternal Grandmother   . Hyperlipidemia Maternal Grandmother   . Hypertension Maternal Grandmother   . Cancer Maternal Grandmother        breast  . Sickle cell anemia Son   . Anesthesia problems Neg Hx     Social History   Tobacco Use  . Smoking status: Current Some Day Smoker    Packs/day: 0.25    Types: Cigarettes  . Smokeless tobacco: Never Used  Vaping Use  . Vaping Use: Never used  Substance Use Topics  . Alcohol use: Yes    Comment: ocassional  . Drug use: No    Home Medications Prior to Admission medications   Medication Sig Start Date End Date Taking? Authorizing Provider  acetaminophen (TYLENOL) 325 MG tablet Take 2 tablets (650 mg total) by mouth every 4 (four) hours as  needed. 03/05/20 03/05/21  Fransico Meadow, PA-C  amLODipine (NORVASC) 5 MG tablet TAKE 1 TABLET BY MOUTH EVERY DAY 10/10/19   Roma Schanz, CNM  Blood Pressure Monitor MISC For regular home bp monitoring during pregnancy 03/20/19   Roma Schanz, CNM  escitalopram (LEXAPRO) 10 MG tablet Take 1 tablet (10 mg total) by mouth daily. 09/28/19   Roma Schanz, CNM  ibuprofen (ADVIL) 600 MG tablet Take 1 tablet (600 mg total) by mouth every 6 (six) hours as needed. 09/07/19   Myrtis Ser, CNM  metroNIDAZOLE (FLAGYL) 500 MG tablet Take 1 tablet (500 mg total) by mouth 2 (two) times daily. 11/14/19   Roma Schanz, CNM    Allergies    Patient has no known allergies.  Review of Systems   Review of Systems  Physical Exam Updated Vital Signs BP 126/83 (BP Location: Right Arm)   Pulse 93   Temp 98.7 F (37.1 C) (Oral)   Resp 18   Ht 5\' 3"  (1.6 m)   Wt 98.9 kg  LMP 05/18/2020   SpO2 97%   BMI 38.62 kg/m   Physical Exam  ED Results / Procedures / Treatments   Labs (all labs ordered are listed, but only abnormal results are displayed) Labs Reviewed  SARS CORONAVIRUS 2 (TAT 6-24 HRS)  POC SARS CORONAVIRUS 2 AG -  ED  POC URINE PREG, ED    EKG None  Radiology DG Chest Portable 1 View  Result Date: 05/25/2020 CLINICAL DATA:  Cough and vomiting.  Suspected coronavirus exposure. EXAM: PORTABLE CHEST 1 VIEW COMPARISON:  03/05/2020 FINDINGS: Heart and mediastinal shadows are normal. Chronic spinal scoliosis. The right lung is clear. Question minimal patchy infiltrate at the left base. No effusion. IMPRESSION: Question minimal patchy infiltrate at the left base. No dense consolidation or collapse. Electronically Signed   By: Nelson Chimes M.D.   On: 05/25/2020 13:49    Procedures Procedures (including critical care time)  Medications Ordered in ED Medications - No data to display  ED Course  I have reviewed the triage vital signs and the nursing  notes.  Pertinent labs & imaging results that were available during my care of the patient were reviewed by me and considered in my medical decision making (see chart for details).    MDM Rules/Calculators/A&P                          *** Final Clinical Impression(s) / ED Diagnoses Final diagnoses:  Suspected COVID-19 virus infection    Rx / DC Orders ED Discharge Orders    None

## 2020-05-25 NOTE — Discharge Instructions (Signed)
As discussed I suspect your COVID-19 test will be positive as your son and your husband both have this infection.  It will be important for you to rest and make sure you are drinking plenty of fluids.  Treat your symptoms as needed.  Get rechecked if you develop any significant shortness of breath or profound weakness.  You will need to maintain home quarantine for total of 7 days assuming your symptoms are resolved by day 7.  Refer to other helpful information below from the [facility] health department.     Person Under Monitoring Name: Linda Warner  Location: [address] [place] [address]   Infection Prevention Recommendations for Individuals Confirmed to have, or Being Evaluated for, 2019 Novel Coronavirus (COVID-19) Infection Who Receive Care at Home  Individuals who are confirmed to have, or are being evaluated for, COVID-19 should follow the prevention steps below until a healthcare provider or local or state health department says they can return to normal activities.  Stay home except to get medical care You should restrict activities outside your home, except for getting medical care. Do not go to work, school, or public areas, and do not use public transportation or taxis.  Call ahead before visiting your doctor Before your medical appointment, call the healthcare provider and tell them that you have, or are being evaluated for, COVID-19 infection. This will help the healthcare providers office take steps to keep other people from getting infected. Ask your healthcare provider to call the local or state health department.  Monitor your symptoms Seek prompt medical attention if your illness is worsening (e.g., difficulty breathing). Before going to your medical appointment, call the healthcare provider and tell them that you have, or are being evaluated for, COVID-19 infection. Ask your healthcare provider to call the local or state health department.  Wear a facemask You  should wear a facemask that covers your nose and mouth when you are in the same room with other people and when you visit a healthcare provider. People who live with or visit you should also wear a facemask while they are in the same room with you.  Separate yourself from other people in your home As much as possible, you should stay in a different room from other people in your home. Also, you should use a separate bathroom, if available.  Avoid sharing household items You should not share dishes, drinking glasses, cups, eating utensils, towels, bedding, or other items with other people in your home. After using these items, you should wash them thoroughly with soap and water.  Cover your coughs and sneezes Cover your mouth and nose with a tissue when you cough or sneeze, or you can cough or sneeze into your sleeve. Throw used tissues in a lined trash can, and immediately wash your hands with soap and water for at least 20 seconds or use an alcohol-based hand rub.  Wash your [facility] your hands often and thoroughly with soap and water for at least 20 seconds. You can use an alcohol-based hand sanitizer if soap and water are not available and if your hands are not visibly dirty. Avoid touching your eyes, nose, and mouth with unwashed hands.   Prevention Steps for Caregivers and Household Members of Individuals Confirmed to have, or Being Evaluated for, COVID-19 Infection Being Cared for in the Home  If you live with, or provide care at home for, a person confirmed to have, or being evaluated for, COVID-19 infection please follow these  guidelines to prevent infection:  Follow healthcare providers instructions Make sure that you understand and can help the patient follow any healthcare provider instructions for all care.  Provide for the patients basic needs You should help the patient with basic needs in the home and provide support for getting groceries, prescriptions, and other  personal needs.  Monitor the patients symptoms If they are getting sicker, call his or her medical provider and tell them that the patient has, or is being evaluated for, COVID-19 infection. This will help the healthcare providers office take steps to keep other people from getting infected. Ask the healthcare provider to call the local or state health department.  Limit the number of people who have contact with the patient If possible, have only one caregiver for the patient. Other household members should stay in another home or place of residence. If this is not possible, they should stay in another room, or be separated from the patient as much as possible. Use a separate bathroom, if available. Restrict visitors who do not have an essential need to be in the home.  Keep older adults, very young children, and other sick people away from the patient Keep older adults, very young children, and those who have compromised immune systems or chronic health conditions away from the patient. This includes people with chronic heart, lung, or kidney conditions, diabetes, and cancer.  Ensure good ventilation Make sure that shared spaces in the home have good air flow, such as from an air conditioner or an opened window, weather permitting.  Wash your hands often Wash your hands often and thoroughly with soap and water for at least 20 seconds. You can use an alcohol based hand sanitizer if soap and water are not available and if your hands are not visibly dirty. Avoid touching your eyes, nose, and mouth with unwashed hands. Use disposable paper towels to dry your hands. If not available, use dedicated cloth towels and replace them when they become wet.  Wear a facemask and gloves Wear a disposable facemask at all times in the room and gloves when you touch or have contact with the patients blood, body fluids, and/or secretions or excretions, such as sweat, saliva, sputum, nasal mucus, vomit,  urine, or feces.  Ensure the mask fits over your nose and mouth tightly, and do not touch it during use. Throw out disposable facemasks and gloves after using them. Do not reuse. Wash your hands immediately after removing your facemask and gloves. If your personal clothing becomes contaminated, carefully remove clothing and launder. Wash your hands after handling contaminated clothing. Place all used disposable facemasks, gloves, and other waste in a lined container before disposing them with other household waste. Remove gloves and wash your hands immediately after handling these items.  Do not share dishes, glasses, or other household items with the patient Avoid sharing household items. You should not share dishes, drinking glasses, cups, eating utensils, towels, bedding, or other items with a patient who is confirmed to have, or being evaluated for, COVID-19 infection. After the person uses these items, you should wash them thoroughly with soap and water.  Wash laundry thoroughly Immediately remove and wash clothes or bedding that have blood, body fluids, and/or secretions or excretions, such as sweat, saliva, sputum, nasal mucus, vomit, urine, or feces, on them. Wear gloves when handling laundry from the patient. Read and follow directions on labels of laundry or clothing items and detergent. In general, wash and dry with the warmest temperatures  recommended on the label.  Clean all areas the individual has used often Clean all touchable surfaces, such as counters, tabletops, doorknobs, bathroom fixtures, toilets, phones, keyboards, tablets, and bedside tables, every day. Also, clean any surfaces that may have blood, body fluids, and/or secretions or excretions on them. Wear gloves when cleaning surfaces the patient has come in contact with. Use a diluted bleach solution (e.g., dilute bleach with 1 part bleach and 10 parts water) or a household disinfectant with a label that says  EPA-registered for coronaviruses. To make a bleach solution at home, add 1 tablespoon of bleach to 1 quart (4 cups) of water. For a larger supply, add  cup of bleach to 1 gallon (16 cups) of water. Read labels of cleaning products and follow recommendations provided on product labels. Labels contain instructions for safe and effective use of the cleaning product including precautions you should take when applying the product, such as wearing gloves or eye protection and making sure you have good ventilation during use of the product. Remove gloves and wash hands immediately after cleaning.  Monitor yourself for signs and symptoms of illness Caregivers and household members are considered close contacts, should monitor their health, and will be asked to limit movement outside of the home to the extent possible. Follow the monitoring steps for close contacts listed on the symptom monitoring form.   ? If you have additional questions, contact your local health department or call the epidemiologist on call at [phone] (available 24/7). ? This guidance is subject to change. For the most up-to-date guidance from [facility], please refer to their website: [email or web address]

## 2020-05-25 NOTE — ED Notes (Signed)
No covid vaccine   High risk child at home w same sx   Spouse covid pos   Here for eval   Spec in lab

## 2020-05-26 LAB — SARS CORONAVIRUS 2 (TAT 6-24 HRS): SARS Coronavirus 2: POSITIVE — AB

## 2020-05-26 NOTE — ED Provider Notes (Signed)
[facility] EMERGENCY DEPARTMENT Provider Note   CSN: [id number] Arrival date & time: 05/25/20  1054     History No chief complaint on file.   Linda Warner is a 30 y.o. female presenting for evaluation of suspected Covid infection.  Her husband was diagnosed with Covid 2 days ago, despite pt's symptoms actually starting sooner, 4 days ago and include body aches, chills but no documented fever, dry cough.  She denies cp or shortness of breath, also denies headache, neck pain or stiffness, n/v/d or abdominal pain. She endorses generalized fatigue.  Her son is also here for Covid testing.  Pt is not vaccinated.  She has taken tylenol with transient improvement in symptoms.   HPI     Past Medical History:  Diagnosis Date  . Acne   . BV (bacterial vaginosis)   . Gestational hypertension   . Miscarriage   . 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 Pregnant   . Scoliosis   . Scoliosis     Patient Active Problem List   Diagnosis Date Noted  . History of preterm delivery 09/28/2019  . History of gestational hypertension 09/04/2019  . Abnormal chromosomal and genetic finding on antenatal screening of mother 04/03/2019  . Family history of sickle cell disease 03/20/2019  . Postpartum depression 01/30/2013  . SCOLIOSIS 07/25/2008  . MOOD SWINGS 10/14/2006    Past Surgical History:  Procedure Laterality Date  . NO PAST SURGERIES       OB History    Gravida  6   Para  4   Term  3   Preterm  1   AB  1   Living  2     SAB  1   IAB  0   Ectopic  0   Multiple      Live Births  2           Family History  Problem Relation Age of Onset  . Depression Father   . Depression Maternal Grandmother   . Diabetes Maternal Grandmother   . Hyperlipidemia Maternal Grandmother   . Hypertension Maternal Grandmother   . Cancer Maternal Grandmother        breast  . Sickle cell anemia Son   . Anesthesia problems Neg Hx     Social History   Tobacco Use  . Smoking  status: Current Some Day Smoker    Packs/day: 0.25    Types: Cigarettes  . Smokeless tobacco: Never Used  Vaping Use  . Vaping Use: Never used  Substance Use Topics  . Alcohol use: Yes    Comment: ocassional  . Drug use: No    Home Medications Prior to Admission medications   Medication Sig Start Date End Date Taking? Authorizing Provider  acetaminophen (TYLENOL) 325 MG tablet Take 2 tablets (650 mg total) by mouth every 4 (four) hours as needed. 03/05/20 03/05/21  Fransico Meadow, PA-C  amLODipine (NORVASC) 5 MG tablet TAKE 1 TABLET BY MOUTH EVERY DAY 10/10/19   Roma Schanz, CNM  Blood Pressure Monitor MISC For regular home bp monitoring during pregnancy 03/20/19   Roma Schanz, CNM  escitalopram (LEXAPRO) 10 MG tablet Take 1 tablet (10 mg total) by mouth daily. 09/28/19   Roma Schanz, CNM  ibuprofen (ADVIL) 600 MG tablet Take 1 tablet (600 mg total) by mouth every 6 (six) hours as needed. 09/07/19   Myrtis Ser, CNM  metroNIDAZOLE (FLAGYL) 500 MG tablet Take 1 tablet (500 mg total) by  mouth 2 (two) times daily. 11/14/19   Roma Schanz, CNM    Allergies    Patient has no known allergies.  Review of Systems   Review of Systems  Constitutional: Positive for chills and fatigue. Negative for fever.  HENT: Negative for congestion, ear pain, rhinorrhea, sinus pressure, sore throat, trouble swallowing and voice change.   Eyes: Negative for discharge.  Respiratory: Positive for cough. Negative for shortness of breath, wheezing and stridor.   Cardiovascular: Negative for chest pain.  Gastrointestinal: Negative for abdominal pain.  Genitourinary: Negative.   Musculoskeletal: Positive for myalgias.    Physical Exam Updated Vital Signs BP 126/83 (BP Location: Right Arm)   Pulse 93   Temp 98.7 F (37.1 C) (Oral)   Resp 18   Ht 5\' 3"  (1.6 m)   Wt 98.9 kg   LMP 05/18/2020   SpO2 97%   BMI 38.62 kg/m   Physical Exam Vitals and nursing note reviewed.   Constitutional:      Appearance: She is well-developed and well-nourished.  HENT:     Head: Normocephalic and atraumatic.     Nose: No congestion or rhinorrhea.     Mouth/Throat:     Mouth: Mucous membranes are moist.  Eyes:     Conjunctiva/sclera: Conjunctivae normal.  Cardiovascular:     Rate and Rhythm: Normal rate and regular rhythm.     Pulses: Normal pulses and intact distal pulses.     Heart sounds: Normal heart sounds. No murmur heard.   Pulmonary:     Effort: Pulmonary effort is normal. No respiratory distress.     Breath sounds: Normal breath sounds. No wheezing or rhonchi.  Abdominal:     General: Bowel sounds are normal.     Palpations: Abdomen is soft.     Tenderness: There is no abdominal tenderness.  Musculoskeletal:        General: Normal range of motion.     Cervical back: Normal range of motion.  Skin:    General: Skin is warm and dry.  Neurological:     General: No focal deficit present.     Mental Status: She is alert.  Psychiatric:        Mood and Affect: Mood and affect normal.     ED Results / Procedures / Treatments   Labs (all labs ordered are listed, but only abnormal results are displayed) Labs Reviewed  SARS CORONAVIRUS 2 (TAT 6-24 HRS) - Abnormal; Notable for the following components:      Result Value   SARS Coronavirus 2 POSITIVE (*)    All other components within normal limits  POC SARS CORONAVIRUS 2 AG -  ED  POC URINE PREG, ED    EKG None  Radiology DG Chest Portable 1 View  Result Date: 05/25/2020 CLINICAL DATA:  Cough and vomiting.  Suspected coronavirus exposure. EXAM: PORTABLE CHEST 1 VIEW COMPARISON:  03/05/2020 FINDINGS: Heart and mediastinal shadows are normal. Chronic spinal scoliosis. The right lung is clear. Question minimal patchy infiltrate at the left base. No effusion. IMPRESSION: Question minimal patchy infiltrate at the left base. No dense consolidation or collapse. Electronically Signed   By: Nelson Chimes M.D.    On: 05/25/2020 13:49    Procedures Procedures (including critical care time)  Medications Ordered in ED Medications - No data to display  ED Course  I have reviewed the triage vital signs and the nursing notes.  Pertinent labs & imaging results that were available during my care of the  patient were reviewed by me and considered in my medical decision making (see chart for details).    MDM Rules/Calculators/A&P                          Pt with suspected Covid 19, exam and vss, no hypoxia, pt does not appear acutely ill.  Home instructions given including quarantine recommendations per cdc. Strict return precautions also outlined.  Lab result had not resulted at time of dc, but pcr has resulted positive as expected given both family members with same.   Linda Warner was evaluated in Emergency Department on 05/26/2020 for the symptoms described in the history of present illness. She was evaluated in the context of the global COVID-19 pandemic, which necessitated consideration that the patient might be at risk for infection with the SARS-CoV-2 virus that causes COVID-19. Institutional protocols and algorithms that pertain to the evaluation of patients at risk for COVID-19 are in a state of rapid change based on information released by regulatory bodies including the CDC and federal and state organizations. These policies and algorithms were followed during the patient's care in the ED.  Final Clinical Impression(s) / ED Diagnoses Final diagnoses:  Suspected COVID-19 virus infection    Rx / DC Orders ED Discharge Orders    None       Landis Martins 05/26/20 1733    Isla Pence, MD 05/28/20 [phone]

## 2020-05-27 ENCOUNTER — Telehealth: Payer: Self-pay | Admitting: *Deleted

## 2020-05-27 ENCOUNTER — Telehealth: Payer: Self-pay

## 2020-05-27 NOTE — Telephone Encounter (Signed)
Transition Care Management Follow-up Telephone Call  Date of discharge and from where: 05/25/2020 [facility]  How have you been since you were released from the hospital? Doing okay.   Any questions or concerns? No  Items Reviewed:  Did the pt receive and understand the discharge instructions provided? Yes   Medications obtained and verified? No medications given.  Other? No   Any new allergies since your discharge? No   Dietary orders reviewed? Yes  Do you have support at home? Yes    Functional Questionnaire: (I = Independent and D = Dependent) ADLs: I  Bathing/Dressing- I  Meal Prep- I  Eating- I  Maintaining continence- I  Transferring/Ambulation- I  Managing Meds- I  Follow up appointments reviewed:   PCP Hospital f/u appt confirmed? Yes  Scheduled to see [place] on 06/10/2020 @ [facility] f/u appt confirmed? No    Are transportation arrangements needed? No   If their condition worsens, is the pt aware to call PCP or go to the Emergency Dept.? Yes  Was the patient provided with contact information for the PCP's office or ED? Yes  Was to pt encouraged to call back with questions or concerns? Yes

## 2020-05-27 NOTE — Telephone Encounter (Signed)
No answer and unable to leave VM.

## 2020-06-10 ENCOUNTER — Telehealth: Payer: Medicaid Other

## 2021-01-09 ENCOUNTER — Ambulatory Visit
Admission: EM | Admit: 2021-01-09 | Discharge: 2021-01-09 | Disposition: A | Discharge status: Home / Self Care | Payer: Medicaid Other | Attending: Emergency Medicine | Admitting: Emergency Medicine

## 2021-01-09 DIAGNOSIS — J029 Acute pharyngitis, unspecified: Secondary | ICD-10-CM

## 2021-01-09 LAB — POCT RAPID STREP A (OFFICE): Rapid Strep A Screen: NEGATIVE

## 2021-01-09 MED ORDER — AMOXICILLIN 500 MG PO CAPS: 500.0000 mg | ORAL_CAPSULE | Freq: Two times a day (BID) | ORAL | 0 refills | Status: AC

## 2021-01-09 NOTE — Discharge Instructions (Addendum)
Strep test negative, will send out for culture and we will call you with results Push fluids and get rest Prescribed amoxicillin '500mg'$  twice daily for 10 days.  Take as directed and to completion.  Drink warm or cool liquids, use throat lozenges, or popsicles to help alleviate symptoms Take OTC ibuprofen or tylenol as needed for pain Follow up with PCP if symptoms persist Return or go to ER if you have any new or worsening symptoms such as fever, chills, nausea, vomiting, worsening sore throat, cough, abdominal pain, chest pain, changes in bowel or bladder habits, etc..Marland Kitchen

## 2021-01-09 NOTE — ED Triage Notes (Signed)
Pt presents with c/o sore throat and body aches that began last night, states she has white patches on throat

## 2021-01-09 NOTE — ED Provider Notes (Signed)
[place]   [id number] 01/09/21 Arrival Time: [id number]  [id number] THROAT  SUBJECTIVE: History from: patient.  Linda Warner is a 30 y.o. female who presents with abrupt onset of sore throat and body aches for 1 day .  Denies to sick exposure to strep, flu or mono, or precipitating event.  Has tried OTC medications without relief.  Symptoms are made worse with swallowing, but tolerating liquids and own secretions without difficulty.  Reports previous symptoms in the past.   Denies fever, chills, ear pain, sinus pain, rhinorrhea, nasal congestion, cough, SOB, wheezing, chest pain, nausea, rash, changes in bowel or bladder habits.    ROS: As per HPI.  All other pertinent ROS negative.     Past Medical History:  Diagnosis Date   Acne    BV (bacterial vaginosis)    Gestational hypertension    Miscarriage    Post partum depression 01/30/2013   Pregnant    Scoliosis    Scoliosis    Past Surgical History:  Procedure Laterality Date   NO PAST SURGERIES     No Known Allergies No current facility-administered medications on file prior to encounter.   Current Outpatient Medications on File Prior to Encounter  Medication Sig Dispense Refill   acetaminophen (TYLENOL) 325 MG tablet Take 2 tablets (650 mg total) by mouth every 4 (four) hours as needed. 30 tablet 2   Blood Pressure Monitor MISC For regular home bp monitoring during pregnancy 1 each 0   ibuprofen (ADVIL) 600 MG tablet Take 1 tablet (600 mg total) by mouth every 6 (six) hours as needed. 30 tablet 0   metroNIDAZOLE (FLAGYL) 500 MG tablet Take 1 tablet (500 mg total) by mouth 2 (two) times daily. 14 tablet 0   Social History   Socioeconomic History   Marital status: Married    Spouse name: Leighanna Reynolds   Number of children: 2   Years of education: Not on file   Highest education level: High school graduate  Occupational History   Not on file  Tobacco Use   Smoking status: Some Days    Packs/day: 0.25    Types:  Cigarettes   Smokeless tobacco: Never  Vaping Use   Vaping Use: Never used  Substance and Sexual Activity   Alcohol use: Yes    Comment: ocassional   Drug use: No   Sexual activity: Yes    Birth control/protection: None  Other Topics Concern   Not on file  Social History Narrative   ** Merged History Encounter **       Social Determinants of Health   Financial Resource Strain: Not on file  Food Insecurity: Not on file  Transportation Needs: Not on file  Physical Activity: Not on file  Stress: Not on file  Social Connections: Not on file  Intimate Partner Violence: Not on file   Family History  Problem Relation Age of Onset   Depression Father    Depression Maternal Grandmother    Diabetes Maternal Grandmother    Hyperlipidemia Maternal Grandmother    Hypertension Maternal Grandmother    Cancer Maternal Grandmother        breast   Sickle cell anemia Son    Anesthesia problems Neg Hx     OBJECTIVE:  Vitals:   01/09/21 0838  BP: (!) 138/94  Pulse: (!) 117  Resp: 20  Temp: 98.3 F (36.8 C)  SpO2: 96%     General appearance: alert; appears mildly fatigued, but nontoxic, speaking in full  sentences and managing own secretions HEENT: NCAT; Ears: EACs clear, TMs pearly gray with visible cone of light, without erythema; Eyes: PERRL, EOMI grossly; Nose: no obvious rhinorrhea; Throat: oropharynx clear, tonsils 2-3+ and erythematous with white tonsillar exudates, uvula midline Neck: supple without LAD Lungs: CTA bilaterally without adventitious breath sounds; cough absent Heart: regular rate and rhythm.   Skin: warm and dry Psychological: alert and cooperative; normal mood and affect  LABS: Results for orders placed or performed during the hospital encounter of 01/09/21 (from the past 24 hour(s))  POCT rapid strep A     Status: None   Collection Time: 01/09/21  8:46 AM  Result Value Ref Range   Rapid Strep A Screen Negative Negative     ASSESSMENT & PLAN:  1.  Sore throat     Meds ordered this encounter  Medications   amoxicillin (AMOXIL) 500 MG capsule    Sig: Take 1 capsule (500 mg total) by mouth 2 (two) times daily for 10 days.    Dispense:  20 capsule    Refill:  0    Order Specific Question:   Supervising Provider    Answer:   Raylene Everts [id number]    Strep test negative, will send out for culture and we will call you with results Push fluids and get rest However, based on exam and symptoms will cover for strep Prescribed amoxicillin '500mg'$  twice daily for 10 days.  Take as directed and to completion.  Drink warm or cool liquids, use throat lozenges, or popsicles to help alleviate symptoms Take OTC ibuprofen or tylenol as needed for pain Follow up with PCP if symptoms persist Return or go to ER if you have any new or worsening symptoms such as fever, chills, nausea, vomiting, worsening sore throat, cough, abdominal pain, chest pain, changes in bowel or bladder habits, etc...  Reviewed expectations re: course of current medical issues. Questions answered. Outlined signs and symptoms indicating need for more acute intervention. Patient verbalized understanding. After Visit Summary given.         Lestine Box, PA-C 01/09/21 [phone]

## 2021-01-20 ENCOUNTER — Emergency Department (HOSPITAL_COMMUNITY)
Admission: EM | Admit: 2021-01-20 | Discharge: 2021-01-20 | Disposition: A | Discharge status: Home / Self Care | Payer: Medicaid Other | Attending: Student | Admitting: Student

## 2021-01-20 ENCOUNTER — Emergency Department (HOSPITAL_COMMUNITY): Payer: Medicaid Other

## 2021-01-20 ENCOUNTER — Other Ambulatory Visit: Payer: Self-pay

## 2021-01-20 DIAGNOSIS — W51XXXA Accidental striking against or bumped into by another person, initial encounter: Secondary | ICD-10-CM | POA: Insufficient documentation

## 2021-01-20 DIAGNOSIS — S82142A Displaced bicondylar fracture of left tibia, initial encounter for closed fracture: Secondary | ICD-10-CM | POA: Insufficient documentation

## 2021-01-20 DIAGNOSIS — M7989 Other specified soft tissue disorders: Secondary | ICD-10-CM | POA: Diagnosis not present

## 2021-01-20 DIAGNOSIS — S8292XA Unspecified fracture of left lower leg, initial encounter for closed fracture: Secondary | ICD-10-CM | POA: Diagnosis not present

## 2021-01-20 DIAGNOSIS — Y9389 Activity, other specified: Secondary | ICD-10-CM | POA: Insufficient documentation

## 2021-01-20 DIAGNOSIS — S8992XA Unspecified injury of left lower leg, initial encounter: Secondary | ICD-10-CM | POA: Diagnosis not present

## 2021-01-20 DIAGNOSIS — F17210 Nicotine dependence, cigarettes, uncomplicated: Secondary | ICD-10-CM | POA: Diagnosis not present

## 2021-01-20 DIAGNOSIS — M25562 Pain in left knee: Secondary | ICD-10-CM | POA: Insufficient documentation

## 2021-01-20 MED ORDER — OXYCODONE-ACETAMINOPHEN 5-325 MG PO TABS: 1.0000 | ORAL_TABLET | ORAL | 0 refills | Status: DC | PRN

## 2021-01-20 MED ORDER — OXYCODONE-ACETAMINOPHEN 5-325 MG PO TABS
2.0000 | ORAL_TABLET | Freq: Once | ORAL | Status: AC
  Administered 2021-01-20: 2 via ORAL
  Filled 2021-01-20: qty 2

## 2021-01-20 NOTE — Progress Notes (Signed)
Discussed with ER provider, patient with minimally displaced tibial plateau fracture, low-energy mechanism of injury, plan for a knee immobilizer, education regarding swelling and compartment syndrome which appears to be very low likelihood, nonweightbearing, return to clinic with me sometime next week.  Right now it does not look like surgical intervention will be indicated.  Marchia Bond, MD

## 2021-01-20 NOTE — Discharge Instructions (Addendum)
Return if any problems.  Schedule to see Dr. Mardelle Matte for appointmnet.  Call his office tomorrow to be seen this week

## 2021-01-20 NOTE — ED Provider Notes (Signed)
[facility] EMERGENCY DEPARTMENT Provider Note   CSN: [id number] Arrival date & time: 01/20/21  0818     History Chief Complaint  Patient presents with   Knee Injury    Linda Warner is a 30 y.o. female.  The history is provided by the patient. No language interpreter was used.  Knee Pain Location:  Knee Time since incident:  1 day Injury: no   Knee location:  L knee Pain details:    Quality:  Aching   Radiates to:  Does not radiate   Severity:  Severe   Onset quality:  Sudden   Duration:  1 day   Timing:  Constant   Progression:  Worsening Chronicity:  New Dislocation: no   Foreign body present:  No foreign bodies Prior injury to area:  No Relieved by:  Nothing Worsened by:  Nothing Ineffective treatments:  None tried Associated symptoms: no back pain   Risk factors: no concern for non-accidental trauma       Past Medical History:  Diagnosis Date   Acne    BV (bacterial vaginosis)    Gestational hypertension    Miscarriage    Post partum depression 01/30/2013   Pregnant    Scoliosis    Scoliosis     Patient Active Problem List   Diagnosis Date Noted   History of preterm delivery 09/28/2019   History of gestational hypertension 09/04/2019   Abnormal chromosomal and genetic finding on antenatal screening of mother 04/03/2019   Family history of sickle cell disease 03/20/2019   Postpartum depression 01/30/2013   SCOLIOSIS 07/25/2008   MOOD SWINGS 10/14/2006    Past Surgical History:  Procedure Laterality Date   NO PAST SURGERIES       OB History     Gravida  6   Para  4   Term  3   Preterm  1   AB  1   Living  2      SAB  1   IAB  0   Ectopic  0   Multiple      Live Births  2           Family History  Problem Relation Age of Onset   Depression Father    Depression Maternal Grandmother    Diabetes Maternal Grandmother    Hyperlipidemia Maternal Grandmother    Hypertension Maternal Grandmother    Cancer Maternal  Grandmother        breast   Sickle cell anemia Son    Anesthesia problems Neg Hx     Social History   Tobacco Use   Smoking status: Some Days    Packs/day: 0.25    Types: Cigarettes   Smokeless tobacco: Never  Vaping Use   Vaping Use: Never used  Substance Use Topics   Alcohol use: Yes    Comment: ocassional   Drug use: No    Home Medications Prior to Admission medications   Not on File    Allergies    Patient has no known allergies.  Review of Systems   Review of Systems  Musculoskeletal:  Negative for back pain.  All other systems reviewed and are negative.  Physical Exam Updated Vital Signs BP 128/81   Pulse (!) 107   Temp 98.4 F (36.9 C) (Oral)   Resp 18   Ht '5\' 3"'$  (1.6 m)   Wt 99 kg   LMP 01/19/2021 (Within Days)   SpO2 98%   BMI 38.66 kg/m  Physical Exam Vitals reviewed.  Musculoskeletal:        General: Swelling and tenderness present.     Comments: Swollen tender knee,  pain with range of motion,  nv nad ns intact   Skin:    General: Skin is warm.  Neurological:     General: No focal deficit present.     Mental Status: She is alert.  Psychiatric:        Mood and Affect: Mood normal.    ED Results / Procedures / Treatments   Labs (all labs ordered are listed, but only abnormal results are displayed) Labs Reviewed - No data to display  EKG None  Radiology DG Knee 2 Views Left  Result Date: 01/20/2021 CLINICAL DATA:  Left knee injury yesterday. EXAM: LEFT KNEE - 1-2 VIEW COMPARISON:  None. FINDINGS: Only two views are submitted. There is some rotation on the lateral view. Cortical irregularity of the lateral tibial plateau on the AP view is suspicious for a nondisplaced fracture. The distal femur, patella and proximal fibula appear intact. There is evidence of a small to moderate knee joint effusion. IMPRESSION: Possible nondisplaced intra-articular fracture of the lateral tibial plateau with associated joint effusion. Recommend further  evaluation with CT. Electronically Signed   By: Richardean Sale M.D.   On: 01/20/2021 09:13    Procedures Procedures   Medications Ordered in ED Medications - No data to display  ED Course  I have reviewed the triage vital signs and the nursing notes.  Pertinent labs & imaging results that were available during my care of the patient were reviewed by me and considered in my medical decision making (see chart for details).    MDM Rules/Calculators/A&P                           MDM: I discussed with Dr. Mardelle Matte who advised have pt call to be seen this week.  Pt placed in a knee immbolizer and given crutches  Final Clinical Impression(s) / ED Diagnoses Final diagnoses:  Closed fracture of left tibial plateau, initial encounter    Rx / DC Orders ED Discharge Orders     None     An After Visit Summary was printed and given to the patient.    Sidney Ace 01/20/21 1131    [place], [place], MD 01/20/21 1620

## 2021-01-20 NOTE — ED Notes (Signed)
Patient transported to CT 

## 2021-01-20 NOTE — ED Triage Notes (Signed)
Patient states she was playing kickball last night and a person ran into her left knee. Patient states swelling with 10/10 pain. Patient states unable to put pressure on her left leg and has not taking nay medication for pain.

## 2021-01-21 ENCOUNTER — Telehealth: Payer: Self-pay

## 2021-01-21 NOTE — Telephone Encounter (Signed)
Transition Care Management Follow-up Telephone Call Date of discharge and from where: 01/20/2021-[place] [facility]  How have you been since you were released from the hospital? Patient stated she is doing fine. Any questions or concerns? No  Items Reviewed: Did the pt receive and understand the discharge instructions provided? Yes  Medications obtained and verified? Yes  Other? No  Any new allergies since your discharge? No  Dietary orders reviewed? N/A Do you have support at home? Yes   Home Care and Equipment/Supplies: Were home health services ordered? not applicable If so, what is the name of the agency? N/A  Has the agency set up a time to come to the patient's home? not applicable Were any new equipment or medical supplies ordered?  No What is the name of the medical supply agency? N/A Were you able to get the supplies/equipment? not applicable Do you have any questions related to the use of the equipment or supplies? No  Functional Questionnaire: (I = Independent and D = Dependent) ADLs: I  Bathing/Dressing- I  Meal Prep- I  Eating- I  Maintaining continence- I  Transferring/Ambulation- I  Managing Meds- I  Follow up appointments reviewed:  PCP Hospital f/u appt confirmed? No   Specialist Hospital f/u appt confirmed? Yes  Scheduled to see Dr. Mardelle Matte on 01/22/2021 @ 9:30 am. Are transportation arrangements needed? No  If their condition worsens, is the pt aware to call PCP or go to the Emergency Dept.? Yes Was the patient provided with contact information for the PCP's office or ED? Yes Was to pt encouraged to call back with questions or concerns? Yes

## 2021-01-22 DIAGNOSIS — S82145A Nondisplaced bicondylar fracture of left tibia, initial encounter for closed fracture: Secondary | ICD-10-CM | POA: Diagnosis not present

## 2021-01-26 ENCOUNTER — Ambulatory Visit
Admission: EM | Admit: 2021-01-26 | Discharge: 2021-01-26 | Disposition: A | Discharge status: Home / Self Care | Payer: Medicaid Other | Attending: Family Medicine | Admitting: Family Medicine

## 2021-01-26 DIAGNOSIS — M79605 Pain in left leg: Secondary | ICD-10-CM | POA: Diagnosis not present

## 2021-01-26 DIAGNOSIS — Z79899 Other long term (current) drug therapy: Secondary | ICD-10-CM

## 2021-01-26 MED ORDER — TIZANIDINE HCL 2 MG PO TABS: 2.0000 mg | ORAL_TABLET | Freq: Every day | ORAL | 0 refills | Status: DC

## 2021-01-26 MED ORDER — NAPROXEN 500 MG PO TABS: 500.0000 mg | ORAL_TABLET | Freq: Two times a day (BID) | ORAL | 0 refills | Status: DC

## 2021-01-26 NOTE — ED Triage Notes (Signed)
Patient presents to Urgent Care with complaints of left leg pain. She states she was ED for fracture and followed up with ortho. She states ortho MD prescribed her a mediation that has caused nausea and not helping with pain. She states she if here to see if she can be prescribed a diff pain medication.

## 2021-01-26 NOTE — ED Provider Notes (Signed)
[facility]    CSN: [id number] Arrival date & time: 01/26/21  1208      History   Chief Complaint Chief Complaint  Patient presents with   Leg Injury    Left leg     HPI Linda Warner is a 30 y.o. female.   HPI Patient presents today for pain management related to a left leg fracture.  Patient was seen at the ER 01/20/2021 diagnosed with a closed fracture involving the left tibia.  She reports following up with [facility] in was placed on meloxicam however medication caused severe nausea and vomiting.  While at the ER she was placed on opiate medication and reports she was unable to tolerate medication as it was too strong.  She is requesting an alternative medication.  She has a follow-up scheduled with [facility] within the next week or 2.  Past Medical History:  Diagnosis Date   Acne    BV (bacterial vaginosis)    Gestational hypertension    Miscarriage    Post partum depression 01/30/2013   Pregnant    Scoliosis    Scoliosis     Patient Active Problem List   Diagnosis Date Noted   History of preterm delivery 09/28/2019   History of gestational hypertension 09/04/2019   Abnormal chromosomal and genetic finding on antenatal screening of mother 04/03/2019   Family history of sickle cell disease 03/20/2019   Postpartum depression 01/30/2013   SCOLIOSIS 07/25/2008   MOOD SWINGS 10/14/2006    Past Surgical History:  Procedure Laterality Date   NO PAST SURGERIES      OB History     Gravida  6   Para  4   Term  3   Preterm  1   AB  1   Living  2      SAB  1   IAB  0   Ectopic  0   Multiple      Live Births  2            Home Medications    Prior to Admission medications   Medication Sig Start Date End Date Taking? Authorizing Provider  oxyCODONE-acetaminophen (PERCOCET) 5-325 MG tablet Take 1 tablet by mouth every 4 (four) hours as needed for severe pain. 01/20/21 01/20/22  Fransico Meadow, PA-C    Family History Family  History  Problem Relation Age of Onset   Depression Father    Depression Maternal Grandmother    Diabetes Maternal Grandmother    Hyperlipidemia Maternal Grandmother    Hypertension Maternal Grandmother    Cancer Maternal Grandmother        breast   Sickle cell anemia Son    Anesthesia problems Neg Hx     Social History Social History   Tobacco Use   Smoking status: Some Days    Packs/day: 0.25    Types: Cigarettes   Smokeless tobacco: Never  Vaping Use   Vaping Use: Never used  Substance Use Topics   Alcohol use: Yes    Comment: ocassional   Drug use: No     Allergies   Patient has no known allergies.   Review of Systems Review of Systems Pertinent negatives listed in HPI   Physical Exam Triage Vital Signs ED Triage Vitals  Enc Vitals Group     BP 01/26/21 1309 125/80     Pulse Rate 01/26/21 1309 90     Resp 01/26/21 1309 16     Temp 01/26/21 1309 98.1 F (  36.7 C)     Temp Source 01/26/21 1309 Oral     SpO2 01/26/21 1309 98 %     Weight --      Height --      Head Circumference --      Peak Flow --      Pain Score 01/26/21 1308 10     Pain Loc --      Pain Edu? --      Excl. in [place]? --    No data found.  Updated Vital Signs BP 125/80 (BP Location: Right Arm)   Pulse 90   Temp 98.1 F (36.7 C) (Oral)   Resp 16   LMP 01/19/2021 (Within Days)   SpO2 98%   Visual Acuity Right Eye Distance:   Left Eye Distance:   Bilateral Distance:    Right Eye Near:   Left Eye Near:    Bilateral Near:     Physical Exam General appearance: Alert, well developed, well nourished, cooperative and in no distress Head: Normocephalic, without obvious abnormality, atraumatic Respiratory: Respirations even and unlabored, normal respiratory rate Heart: rate and rhythm normal. No gallop or murmurs noted on exam  Abdomen: BS +, no distention, no rebound tenderness, or no mass Extremities: Immobilizer left lower extremity (ambulating with crutches) Skin: Skin  color, texture, turgor normal. No rashes seen  Psych: Appropriate mood and affect. Neurologic: GCS 15, normal coordination, normal gait UC Treatments / Results  Labs (all labs ordered are listed, but only abnormal results are displayed) Labs Reviewed - No data to display  EKG   Radiology No results found.  Procedures Procedures (including critical care time)  Medications Ordered in UC Medications - No data to display  Initial Impression / Assessment and Plan / UC Course  I have reviewed the triage vital signs and the nursing notes.  Pertinent labs & imaging results that were available during my care of the patient were reviewed by me and considered in my medical decision making (see chart for details).    Discontinue meloxicam.  Start naproxen 500 mg twice daily as needed for pain.  Tizanidine 1 to 2 tablets as needed at bedtime for moderate to severe pain.  Keep follow-up with orthopedic provider.  Final Clinical Impressions(s) / UC Diagnoses   Final diagnoses:  Left leg pain  Medication therapy changed   Discharge Instructions   None    ED Prescriptions     Medication Sig Dispense Auth. Provider   naproxen (NAPROSYN) 500 MG tablet Take 1 tablet (500 mg total) by mouth 2 (two) times daily. 30 tablet Scot Jun, FNP   tiZANidine (ZANAFLEX) 2 MG tablet Take 1-2 tablets (2-4 mg total) by mouth at bedtime. 30 tablet Scot Jun, FNP      PDMP not reviewed this encounter.   Scot Jun,  02/01/21 [phone]

## 2021-01-29 DIAGNOSIS — S82145D Nondisplaced bicondylar fracture of left tibia, subsequent encounter for closed fracture with routine healing: Secondary | ICD-10-CM | POA: Diagnosis not present

## 2021-03-03 DIAGNOSIS — S82145D Nondisplaced bicondylar fracture of left tibia, subsequent encounter for closed fracture with routine healing: Secondary | ICD-10-CM | POA: Diagnosis not present

## 2021-03-15 DIAGNOSIS — S82142A Displaced bicondylar fracture of left tibia, initial encounter for closed fracture: Secondary | ICD-10-CM | POA: Diagnosis not present

## 2021-03-21 ENCOUNTER — Encounter (HOSPITAL_COMMUNITY): Payer: Self-pay

## 2021-03-21 ENCOUNTER — Other Ambulatory Visit: Payer: Self-pay

## 2021-03-21 ENCOUNTER — Ambulatory Visit (HOSPITAL_COMMUNITY): Payer: Medicaid Other | Attending: Orthopedic Surgery

## 2021-03-21 DIAGNOSIS — M6281 Muscle weakness (generalized): Secondary | ICD-10-CM | POA: Insufficient documentation

## 2021-03-21 DIAGNOSIS — R29898 Other symptoms and signs involving the musculoskeletal system: Secondary | ICD-10-CM | POA: Insufficient documentation

## 2021-03-21 NOTE — Therapy (Signed)
[place], [place], [address] Phone: [phone]   Fax:  [phone]  Physical Therapy Evaluation  Patient Details  Name: Linda Warner MRN: [id number] Date of Birth: 1991/03/16 Referring Provider (PT): Dr. Carter Kitten   Encounter Date: 03/21/2021   PT End of Session - 03/21/21 1012     Visit Number 1    Number of Visits 1    Authorization Type [place] [employer]    PT Start Time 0945    PT Stop Time 1010   evaluation only   PT Time Calculation (min) 25 min    Activity Tolerance Patient tolerated treatment well    Behavior During Therapy [facility] for tasks assessed/performed             Past Medical History:  Diagnosis Date   Acne    BV (bacterial vaginosis)    Gestational hypertension    Miscarriage    Post partum depression 01/30/2013   Pregnant    Scoliosis    Scoliosis     Past Surgical History:  Procedure Laterality Date   NO PAST SURGERIES      There were no vitals filed for this visit.    Subjective Assessment - 03/21/21 0955     Subjective September 4 fell playing kickball and fell and sustained a left tibial plateu fx. Non-surgical, PWB in hinged knee brace. Pt arrives to clinic wearing hinged knee brace but no use of AD    Currently in Pain? No/denies                [facility] PT Assessment - 03/21/21 0001       Assessment   Medical Diagnosis Left tibial plateau fx    Referring Provider (PT) Dr. Carter Kitten    Onset Date/Surgical Date 01/19/21    Next MD Visit 03/31/21      Precautions   Required Braces or Orthoses Other Brace/Splint    Other Brace/Splint hinged knee brace      Restrictions   Weight Bearing Restrictions Yes    LLE Weight Bearing Partial weight bearing      Prior Function   Level of Independence Independent      ROM / Strength   AROM / PROM / Strength AROM;Strength      AROM   AROM Assessment Site Knee    Right/Left Knee Left    Left Knee  Extension 0    Left Knee Flexion 125      Strength   Overall Strength Within functional limits for tasks performed    Overall Strength Comments no extensor lag      Ambulation/Gait   Ambulation/Gait Yes    Ambulation/Gait Assistance 7: Independent    Gait Comments per MD orders PWB in effect.                        Objective measurements completed on examination: See above findings.                PT Education - 03/21/21 1012     Education Details education on maintaining PWB compliance per ortho MD order    Person(s) Educated Patient    Methods Explanation    Comprehension Verbalized understanding              PT Short Term Goals - 03/21/21 1018       PT SHORT TERM GOAL #1   Title Demo independence with HEP  Time 1    Period Days    Status Achieved    Target Date 03/21/21                       Plan - 03/21/21 1013     Clinical Impression Statement Pt presents to clinic with no complaints of pain or dysfunction at this time.  Of note, pt has been ambulating without AD despite MD order PWB.  Therapist reinforced to pt of following PWB status until MD clears for WBAT/FWB.  No ROM deficits and no functional strength deficits present. 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  Pt performing mobility at an independent level at this time.  No PT services indicated at this time as no functional limitations of deficits present.  Recommend f/u with ortho MD for further assessment and tx details. One time encounter with training in open chain PRE for LLE to reverse quad atrophy    Personal Factors and Comorbidities Age;Time since onset of injury/illness/exacerbation    Stability/Clinical Decision Making Stable/Uncomplicated    Clinical Decision Making Low    Rehab Potential Excellent    PT Frequency One time visit    PT Treatment/Interventions Therapeutic exercise    PT Home Exercise Plan QS, SLR, sidelying hip abduction     Consulted and Agree with Plan of Care Patient             Patient will benefit from skilled therapeutic intervention in order to improve the following deficits and impairments:  Abnormal gait  Visit Diagnosis: Muscle weakness (generalized)  Other symptoms and signs involving the musculoskeletal system     Problem List Patient Active Problem List   Diagnosis Date Noted   History of preterm delivery 09/28/2019   History of gestational hypertension 09/04/2019   Abnormal chromosomal and genetic finding on antenatal screening of mother 04/03/2019   Family history of sickle cell disease 03/20/2019   Postpartum depression 01/30/2013   SCOLIOSIS 07/25/2008   MOOD SWINGS 10/14/2006    Toniann Fail, PT 03/21/2021, 10:19 AM  [place], [place], [address] Phone: [phone]   Fax:  [phone]  Name: Linda Warner MRN: [id number] Date of Birth: 1991-01-26

## 2021-05-07 ENCOUNTER — Other Ambulatory Visit: Payer: Self-pay

## 2021-05-07 ENCOUNTER — Emergency Department (HOSPITAL_COMMUNITY)
Admission: EM | Admit: 2021-05-07 | Discharge: 2021-05-07 | Disposition: A | Discharge status: Home / Self Care | Payer: Medicaid Other | Attending: Emergency Medicine | Admitting: Emergency Medicine

## 2021-05-07 ENCOUNTER — Encounter (HOSPITAL_COMMUNITY): Payer: Self-pay

## 2021-05-07 DIAGNOSIS — Z20822 Contact with and (suspected) exposure to covid-19: Secondary | ICD-10-CM | POA: Diagnosis not present

## 2021-05-07 DIAGNOSIS — F17210 Nicotine dependence, cigarettes, uncomplicated: Secondary | ICD-10-CM | POA: Insufficient documentation

## 2021-05-07 DIAGNOSIS — J020 Streptococcal pharyngitis: Secondary | ICD-10-CM

## 2021-05-07 DIAGNOSIS — J029 Acute pharyngitis, unspecified: Secondary | ICD-10-CM | POA: Diagnosis present

## 2021-05-07 DIAGNOSIS — R0981 Nasal congestion: Secondary | ICD-10-CM | POA: Diagnosis not present

## 2021-05-07 DIAGNOSIS — R590 Localized enlarged lymph nodes: Secondary | ICD-10-CM | POA: Diagnosis not present

## 2021-05-07 LAB — GROUP A STREP BY PCR: Group A Strep by PCR: DETECTED — AB

## 2021-05-07 LAB — RESP PANEL BY RT-PCR (FLU A&B, COVID) ARPGX2
Influenza A by PCR: NEGATIVE
Influenza B by PCR: NEGATIVE
SARS Coronavirus 2 by RT PCR: NEGATIVE

## 2021-05-07 MED ORDER — LIDOCAINE VISCOUS HCL 2 % MT SOLN: 5.0000 mL | Freq: Three times a day (TID) | OROMUCOSAL | 0 refills | Status: DC | PRN

## 2021-05-07 MED ORDER — PENICILLIN G BENZATHINE 1200000 UNIT/2ML IM SUSY
1.2000 10*6.[IU] | PREFILLED_SYRINGE | Freq: Once | INTRAMUSCULAR | Status: AC
  Administered 2021-05-07: 19:00:00 1.2 10*6.[IU] via INTRAMUSCULAR
  Filled 2021-05-07: qty 2

## 2021-05-07 NOTE — Discharge Instructions (Signed)
Your strep test today was positive.  Your COVID and flu test were negative.  You have been given an injection of penicillin for the strep throat.  I recommend that you alternate Tylenol and ibuprofen every 4 and 6 hours respectively for fever and/or body aches.  Drink plenty of fluids.  You have been prescribed medication to help with your sore throat pain.  Swish and spit this medicine, do not swallow.  Follow-up with your primary care provider for recheck.  Return emergency department for any new or worsening symptoms.

## 2021-05-07 NOTE — ED Triage Notes (Signed)
Sore throat since last night, pt feels she has strep.

## 2021-05-07 NOTE — ED Provider Notes (Signed)
[facility] EMERGENCY DEPARTMENT Provider Note   CSN: [id number] Arrival date & time: 05/07/21  1714     History Chief Complaint  Patient presents with   Sore Throat    Linda Warner is a 30 y.o. female.   Sore Throat Pertinent negatives include no chest pain, no abdominal pain, no headaches and no shortness of breath.       Linda Warner is a 30 y.o. female who presents to the Emergency Department complaining of sore throat and nasal congestion.  Symptoms began last evening.  She initially noticed some pain to her throat with swallowing.  Saw white patches on her tonsils this morning.  Concerned that she may have strep throat.  States she was treated with penicillin earlier this year for strep.  Endorses having tonsillitis in the past as well.  No known COVID or flu exposures.  States she tinges to have a normal appetite.  Tolerating liquids and solid foods.  She denies fever, chills, chest pain, shortness of breath abdominal pain, vomiting or diarrhea. States her children also have similar symptoms.     Past Medical History:  Diagnosis Date   Acne    BV (bacterial vaginosis)    Gestational hypertension    Miscarriage    Post partum depression 01/30/2013   Pregnant    Scoliosis    Scoliosis     Patient Active Problem List   Diagnosis Date Noted   History of preterm delivery 09/28/2019   History of gestational hypertension 09/04/2019   Abnormal chromosomal and genetic finding on antenatal screening of mother 04/03/2019   Family history of sickle cell disease 03/20/2019   Postpartum depression 01/30/2013   SCOLIOSIS 07/25/2008   MOOD SWINGS 10/14/2006    Past Surgical History:  Procedure Laterality Date   NO PAST SURGERIES       OB History     Gravida  6   Para  4   Term  3   Preterm  1   AB  1   Living  2      SAB  1   IAB  0   Ectopic  0   Multiple      Live Births  2           Family History  Problem Relation Age of Onset    Depression Father    Depression Maternal Grandmother    Diabetes Maternal Grandmother    Hyperlipidemia Maternal Grandmother    Hypertension Maternal Grandmother    Cancer Maternal Grandmother        breast   Sickle cell anemia Son    Anesthesia problems Neg Hx     Social History   Tobacco Use   Smoking status: Some Days    Packs/day: 0.25    Types: Cigarettes   Smokeless tobacco: Never  Vaping Use   Vaping Use: Never used  Substance Use Topics   Alcohol use: Yes    Comment: ocassional   Drug use: No    Home Medications Prior to Admission medications   Medication Sig Start Date End Date Taking? Authorizing Provider  naproxen (NAPROSYN) 500 MG tablet Take 1 tablet (500 mg total) by mouth 2 (two) times daily. 01/26/21   Scot Jun, FNP  oxyCODONE-acetaminophen (PERCOCET) 5-325 MG tablet Take 1 tablet by mouth every 4 (four) hours as needed for severe pain. 01/20/21 01/20/22  Fransico Meadow, PA-C  tiZANidine (ZANAFLEX) 2 MG tablet Take 1-2 tablets (2-4 mg total) by  mouth at bedtime. 01/26/21   Scot Jun, FNP    Allergies    Patient has no known allergies.  Review of Systems   Review of Systems  Constitutional:  Positive for appetite change. Negative for chills, fatigue and fever.  HENT:  Positive for congestion, rhinorrhea and sore throat. Negative for sinus pressure, trouble swallowing and voice change.   Respiratory:  Negative for cough, chest tightness, shortness of breath and wheezing.   Cardiovascular:  Negative for chest pain.  Gastrointestinal:  Negative for abdominal pain, nausea and vomiting.  Genitourinary:  Negative for dysuria and flank pain.  Musculoskeletal:  Negative for myalgias, neck pain and neck stiffness.  Skin:  Negative for rash.  Neurological:  Negative for dizziness, weakness, numbness and headaches.  Hematological:  Does not bruise/bleed easily.  All other systems reviewed and are negative.  Physical Exam Updated Vital Signs BP  131/86 (BP Location: Right Arm)    Pulse (!) 108    Temp 98.7 F (37.1 C) (Oral)    Resp 18    Ht 5\' 3"  (1.6 m)    Wt 99.8 kg    SpO2 99%    BMI 38.97 kg/m   Physical Exam Vitals and nursing note reviewed.  Constitutional:      General: She is not in acute distress.    Appearance: She is well-developed. She is not ill-appearing.  HENT:     Right Ear: Tympanic membrane and ear canal normal.     Left Ear: Tympanic membrane and ear canal normal.     Nose: Nose normal.     Mouth/Throat:     Mouth: Mucous membranes are moist.     Pharynx: Uvula midline. Posterior oropharyngeal erythema present. No pharyngeal swelling, oropharyngeal exudate or uvula swelling.     Tonsils: Tonsillar exudate present.     Comments: Erythema of the oropharynx.  Bilateral tonsillar exudate noted.  No edema of the tonsils.  Uvula is midline nonedematous.  No trismus or bulging of the soft palate. Cardiovascular:     Rate and Rhythm: Normal rate and regular rhythm.     Pulses: Normal pulses.  Pulmonary:     Effort: Pulmonary effort is normal.  Abdominal:     Palpations: Abdomen is soft.     Tenderness: There is no abdominal tenderness.  Musculoskeletal:        General: Normal range of motion.     Cervical back: No rigidity or tenderness.  Lymphadenopathy:     Cervical: Cervical adenopathy present.  Skin:    General: Skin is warm.     Capillary Refill: Capillary refill takes less than 2 seconds.     Findings: No rash.  Neurological:     General: No focal deficit present.     Mental Status: She is alert.     Sensory: No sensory deficit.     Motor: No weakness.    ED Results / Procedures / Treatments   Labs (all labs ordered are listed, but only abnormal results are displayed) Labs Reviewed  GROUP A STREP BY PCR - Abnormal; Notable for the following components:      Result Value   Group A Strep by PCR DETECTED (*)    All other components within normal limits  RESP PANEL BY RT-PCR (FLU A&B, COVID)  ARPGX2    EKG None  Radiology No results found.  Procedures Procedures   Medications Ordered in ED Medications - No data to display  ED Course  I have reviewed  the triage vital signs and the nursing notes.  Pertinent labs & imaging results that were available during my care of the patient were reviewed by me and considered in my medical decision making (see chart for details).    MDM Rules/Calculators/A&P                          Patient here for evaluation of sore throat.  Symptoms began last evening.  Endorses having strep throat 2 months ago was treated with oral medication.  Notes having resolution of her symptoms until yesterday.  States her children have similar symptoms.  On exam, patient well-appearing.  There is tonsillar exudate noted without significant edema.  No bulging of the soft palate or edema of the uvula.  She is handling secretions well.  Afebrile.  No concerning symptoms for peritonsillar or retropharyngeal abscess.  Strep PCR positive, influenza and COVID testing are negative.  Discussed findings with patient, through shared decision making,  patient opted to have IM injection of penicillin.  She appears appropriate for discharge home discussed outpatient follow-up.  Return precautions were discussed.      Final Clinical Impression(s) / ED Diagnoses Final diagnoses:  Strep pharyngitis    Rx / DC Orders ED Discharge Orders     None        Bufford Lope 05/07/21 1906    Dorie Rank, MD 05/11/21 1006

## 2021-05-08 ENCOUNTER — Encounter (HOSPITAL_COMMUNITY): Payer: Self-pay

## 2021-05-08 ENCOUNTER — Emergency Department (HOSPITAL_COMMUNITY)
Admission: EM | Admit: 2021-05-08 | Discharge: 2021-05-08 | Disposition: A | Discharge status: Home / Self Care | Payer: Medicaid Other | Attending: Emergency Medicine | Admitting: Emergency Medicine

## 2021-05-08 DIAGNOSIS — A491 Streptococcal infection, unspecified site: Secondary | ICD-10-CM | POA: Diagnosis not present

## 2021-05-08 DIAGNOSIS — R070 Pain in throat: Secondary | ICD-10-CM | POA: Diagnosis present

## 2021-05-08 DIAGNOSIS — R000 Tachycardia, unspecified: Secondary | ICD-10-CM | POA: Diagnosis not present

## 2021-05-08 DIAGNOSIS — F17210 Nicotine dependence, cigarettes, uncomplicated: Secondary | ICD-10-CM | POA: Insufficient documentation

## 2021-05-08 DIAGNOSIS — B950 Streptococcus, group A, as the cause of diseases classified elsewhere: Secondary | ICD-10-CM | POA: Diagnosis not present

## 2021-05-08 DIAGNOSIS — J029 Acute pharyngitis, unspecified: Secondary | ICD-10-CM | POA: Diagnosis not present

## 2021-05-08 MED ORDER — LIDOCAINE VISCOUS HCL 2 % MT SOLN: 5.0000 mL | Freq: Three times a day (TID) | OROMUCOSAL | 0 refills | Status: DC | PRN

## 2021-05-08 MED ORDER — SODIUM CHLORIDE 0.9 % IV BOLUS
1000.0000 mL | Freq: Once | INTRAVENOUS | Status: AC
  Administered 2021-05-08: 10:00:00 1000 mL via INTRAVENOUS

## 2021-05-08 NOTE — ED Provider Notes (Signed)
[facility] EMERGENCY DEPARTMENT Provider Note   CSN: [id number] Arrival date & time: 05/08/21  4132     History No chief complaint on file.   Linda Warner is a 30 y.o. female.  30 year old female presents today for evaluation of worsening sore throat.  Patient was evaluated in this emergency room yesterday and diagnosed with GAS.  Patient was given IM penicillin and prescribed lidocaine mouthwash.  She reports she has not been able to pick up the mouthwash yet.  She denies fever, inability to swallow, nausea, or change in voice.  She reports it is painful to swallow and has had decreased intake since last night.  Her kids have similar symptoms at home.  This is her second strep infection this year.  She reports a history of tonsillitis in the past.  The history is provided by the patient. No language interpreter was used.      Past Medical History:  Diagnosis Date   Acne    BV (bacterial vaginosis)    Gestational hypertension    Miscarriage    Post partum depression 01/30/2013   Pregnant    Scoliosis    Scoliosis     Patient Active Problem List   Diagnosis Date Noted   History of preterm delivery 09/28/2019   History of gestational hypertension 09/04/2019   Abnormal chromosomal and genetic finding on antenatal screening of mother 04/03/2019   Family history of sickle cell disease 03/20/2019   Postpartum depression 01/30/2013   SCOLIOSIS 07/25/2008   MOOD SWINGS 10/14/2006    Past Surgical History:  Procedure Laterality Date   NO PAST SURGERIES       OB History     Gravida  6   Para  4   Term  3   Preterm  1   AB  1   Living  2      SAB  1   IAB  0   Ectopic  0   Multiple      Live Births  2           Family History  Problem Relation Age of Onset   Depression Father    Depression Maternal Grandmother    Diabetes Maternal Grandmother    Hyperlipidemia Maternal Grandmother    Hypertension Maternal Grandmother    Cancer Maternal  Grandmother        breast   Sickle cell anemia Son    Anesthesia problems Neg Hx     Social History   Tobacco Use   Smoking status: Some Days    Packs/day: 0.25    Types: Cigarettes   Smokeless tobacco: Never  Vaping Use   Vaping Use: Never used  Substance Use Topics   Alcohol use: Yes    Comment: ocassional   Drug use: No    Home Medications Prior to Admission medications   Medication Sig Start Date End Date Taking? Authorizing Provider  magic mouthwash (lidocaine, diphenhydrAMINE, alum & mag hydroxide) suspension Swish and spit 5 mLs 3 (three) times daily as needed for mouth pain. 05/07/21   Triplett, Tammy, PA-C  naproxen (NAPROSYN) 500 MG tablet Take 1 tablet (500 mg total) by mouth 2 (two) times daily. 01/26/21   Scot Jun, FNP  oxyCODONE-acetaminophen (PERCOCET) 5-325 MG tablet Take 1 tablet by mouth every 4 (four) hours as needed for severe pain. 01/20/21 01/20/22  Fransico Meadow, PA-C  tiZANidine (ZANAFLEX) 2 MG tablet Take 1-2 tablets (2-4 mg total) by mouth at bedtime. 01/26/21  Scot Jun, FNP    Allergies    Patient has no known allergies.  Review of Systems   Review of Systems  Constitutional:  Negative for activity change, appetite change, chills and fever.  HENT:  Positive for sore throat. Negative for drooling, postnasal drip, sinus pressure, trouble swallowing and voice change.   Respiratory:  Negative for cough and shortness of breath.   Gastrointestinal:  Negative for nausea and vomiting.  Skin:  Negative for rash.  Neurological:  Negative for headaches.  All other systems reviewed and are negative.  Physical Exam Updated Vital Signs BP (!) 154/100 (BP Location: Right Arm)    Pulse (!) 125    Temp 98.7 F (37.1 C) (Oral)    Resp 18    Ht 5\' 3"  (1.6 m)    Wt 99.7 kg    SpO2 99%    BMI 38.94 kg/m   Physical Exam Vitals and nursing note reviewed.  Constitutional:      General: She is not in acute distress.    Appearance: Normal  appearance. She is not ill-appearing.  HENT:     Head: Normocephalic and atraumatic.     Right Ear: Tympanic membrane, ear canal and external ear normal. There is no impacted cerumen.     Left Ear: Tympanic membrane, ear canal and external ear normal. There is no impacted cerumen.     Nose: Nose normal.     Mouth/Throat:     Mouth: Mucous membranes are moist.     Tongue: No lesions. Tongue does not deviate from midline.     Pharynx: Posterior oropharyngeal erythema present.     Tonsils: Tonsillar exudate present. No tonsillar abscesses.  Cardiovascular:     Rate and Rhythm: Regular rhythm. Tachycardia present.  Pulmonary:     Effort: Pulmonary effort is normal. No respiratory distress.     Breath sounds: Normal breath sounds. No wheezing.  Neurological:     Mental Status: She is alert.    ED Results / Procedures / Treatments   Labs (all labs ordered are listed, but only abnormal results are displayed) Labs Reviewed - No data to display  EKG None  Radiology No results found.  Procedures Procedures   Medications Ordered in ED Medications  sodium chloride 0.9 % bolus 1,000 mL (has no administration in time range)    ED Course  I have reviewed the triage vital signs and the nursing notes.  Pertinent labs & imaging results that were available during my care of the patient were reviewed by me and considered in my medical decision making (see chart for details).    MDM Rules/Calculators/A&P                         30 year old female with documented group A strep yesterday presents today for worsening of her symptoms.  Patient reports she has not taken it much p.o. intake because of pain on swallowing.  She denies inability to swallow.  She has taken Aleve with minimal relief.  She was also prescribed Magic mouthwash which she has not picked up yet.  She has history of recent group A strep earlier this year and remote history of tonsillitis.  On exam she does have tonsillar  exudates but without signs of peritonsillar abscess or Ludwick's angina.  She is tachycardic which is likely from dehydration.  We will give a liter fluid bolus.  She appears well on exam without acute distress.  She is  without inability to swallow, or change in voice.  On reevaluation following completion of liter bolus patient's heart rate improved to about 108 remains well-appearing and without acute distress.  Patient is appropriate for discharge.  Discussion had at length regarding return precautions.  Patient voices understanding and is in agreement with plan.     Final Clinical Impression(s) / ED Diagnoses Final diagnoses:  None    Rx / DC Orders ED Discharge Orders     None        Evlyn Courier, PA-C 05/08/21 1117    Long, Wonda Olds, MD 05/13/21 [phone]

## 2021-05-08 NOTE — ED Triage Notes (Signed)
Was here yesterday with a sore throat + for strep today her symptoms are worse.  This nurse assessed her throat yesterday and today throat looks worse, white patches in tonsil areas.  Breathing WNL O2 99%.

## 2021-05-08 NOTE — Discharge Instructions (Addendum)
You are given IV hydration in the emergency room today.  You are given antibiotics in the emergency room yesterday.  Pick up the mouthwash that was prescribed to you yesterday.  Start warm water salt gargles, continue taking Aleve and Tylenol, you can use lozenges as well for symptom relief.  This can take up to 5 days to improve.  Please increase your fluid intake.  If your symptoms worsen, you are unable to swallow fluids or start drooling, your voice becomes muffled please return to the emergency room.

## 2021-05-09 ENCOUNTER — Other Ambulatory Visit: Payer: Self-pay

## 2021-05-09 ENCOUNTER — Telehealth: Payer: Self-pay

## 2021-05-09 ENCOUNTER — Ambulatory Visit
Admission: EM | Admit: 2021-05-09 | Discharge: 2021-05-09 | Disposition: A | Discharge status: Home / Self Care | Payer: Medicaid Other | Attending: Physician Assistant | Admitting: Physician Assistant

## 2021-05-09 DIAGNOSIS — J020 Streptococcal pharyngitis: Secondary | ICD-10-CM | POA: Diagnosis not present

## 2021-05-09 DIAGNOSIS — Z9189 Other specified personal risk factors, not elsewhere classified: Secondary | ICD-10-CM

## 2021-05-09 MED ORDER — CEFDINIR 300 MG PO CAPS: 300.0000 mg | ORAL_CAPSULE | Freq: Two times a day (BID) | ORAL | 0 refills | Status: DC

## 2021-05-09 NOTE — ED Triage Notes (Signed)
Pt presents with worsening sore throat. Has been seen in ED twice , received magic mouthwash and PCN injection but states she feels worse

## 2021-05-09 NOTE — Addendum Note (Signed)
Addended by: Edgar Frisk on: 05/09/2021 06:35 PM   Modules accepted: Orders

## 2021-05-09 NOTE — Telephone Encounter (Signed)
Transition Care Management Follow-up Telephone Call Date of discharge and from where: 05/08/2021-[place]  How have you been since you were released from the hospital? Patient stated she is doing fine.  Any questions or concerns? No  Items Reviewed: Did the pt receive and understand the discharge instructions provided? Yes  Medications obtained and verified? Yes  Other? No  Any new allergies since your discharge? No  Dietary orders reviewed? No Do you have support at home? Yes   Home Care and Equipment/Supplies: Were home health services ordered? not applicable If so, what is the name of the agency? N/A  Has the agency set up a time to come to the patient's home? not applicable Were any new equipment or medical supplies ordered?  No What is the name of the medical supply agency? N/A Were you able to get the supplies/equipment? not applicable Do you have any questions related to the use of the equipment or supplies? No  Functional Questionnaire: (I = Independent and D = Dependent) ADLs: I  Bathing/Dressing- I  Meal Prep- I  Eating- I  Maintaining continence- I  Transferring/Ambulation- I  Managing Meds- I  Follow up appointments reviewed:  PCP Hospital f/u appt confirmed? No   Specialist Hospital f/u appt confirmed? No   Are transportation arrangements needed? No  If their condition worsens, is the pt aware to call PCP or go to the Emergency Dept.? Yes Was the patient provided with contact information for the PCP's office or ED? Yes Was to pt encouraged to call back with questions or concerns? Yes

## 2021-05-16 NOTE — ED Provider Notes (Signed)
[facility]    CSN: [id number] Arrival date & time: 05/09/21  1607      History   Chief Complaint Chief Complaint  Patient presents with   Sore Throat    HPI Linda Warner is a 30 y.o. female.   Pt diagnosed with strep.  Pt complains of increased swelling and pain   The history is provided by the patient. No language interpreter was used.  Sore Throat This is a recurrent problem. The current episode started more than 2 days ago. The problem occurs constantly. The problem has been gradually worsening. Nothing aggravates the symptoms. Nothing relieves the symptoms.   Past Medical History:  Diagnosis Date   Acne    BV (bacterial vaginosis)    Gestational hypertension    Miscarriage    Post partum depression 01/30/2013   Pregnant    Scoliosis    Scoliosis     Patient Active Problem List   Diagnosis Date Noted   History of preterm delivery 09/28/2019   History of gestational hypertension 09/04/2019   Abnormal chromosomal and genetic finding on antenatal screening of mother 04/03/2019   Family history of sickle cell disease 03/20/2019   Postpartum depression 01/30/2013   SCOLIOSIS 07/25/2008   MOOD SWINGS 10/14/2006    Past Surgical History:  Procedure Laterality Date   NO PAST SURGERIES      OB History     Gravida  6   Para  4   Term  3   Preterm  1   AB  1   Living  2      SAB  1   IAB  0   Ectopic  0   Multiple      Live Births  2            Home Medications    Prior to Admission medications   Medication Sig Start Date End Date Taking? Authorizing Provider  cefdinir (OMNICEF) 300 MG capsule Take 1 capsule (300 mg total) by mouth 2 (two) times daily. 05/09/21  Yes Caryl Ada K, PA-C  magic mouthwash (lidocaine, diphenhydrAMINE, alum & mag hydroxide) suspension Swish and spit 5 mLs 3 (three) times daily as needed for mouth pain. 05/08/21   Evlyn Courier, PA-C  naproxen (NAPROSYN) 500 MG tablet Take 1 tablet (500 mg  total) by mouth 2 (two) times daily. Patient not taking: Reported on 05/08/2021 01/26/21   Scot Jun, FNP  oxyCODONE-acetaminophen (PERCOCET) 5-325 MG tablet Take 1 tablet by mouth every 4 (four) hours as needed for severe pain. Patient not taking: Reported on 05/08/2021 01/20/21 01/20/22  Fransico Meadow, PA-C  tiZANidine (ZANAFLEX) 2 MG tablet Take 1-2 tablets (2-4 mg total) by mouth at bedtime. Patient not taking: Reported on 05/08/2021 01/26/21   Scot Jun, FNP    Family History Family History  Problem Relation Age of Onset   Depression Father    Depression Maternal Grandmother    Diabetes Maternal Grandmother    Hyperlipidemia Maternal Grandmother    Hypertension Maternal Grandmother    Cancer Maternal Grandmother        breast   Sickle cell anemia Son    Anesthesia problems Neg Hx     Social History Social History   Tobacco Use   Smoking status: Some Days    Packs/day: 0.25    Types: Cigarettes   Smokeless tobacco: Never  Vaping Use   Vaping Use: Never used  Substance Use Topics   Alcohol use: Yes  Comment: ocassional   Drug use: No     Allergies   Patient has no known allergies.   Review of Systems Review of Systems  All other systems reviewed and are negative.   Physical Exam Triage Vital Signs ED Triage Vitals [05/09/21 1012]  Enc Vitals Group     BP (!) 129/91     Pulse Rate (!) 118     Resp 20     Temp 99.2 F (37.3 C)     Temp src      SpO2 97 %     Weight      Height      Head Circumference      Peak Flow      Pain Score      Pain Loc      Pain Edu?      Excl. in [place]?    No data found.  Updated Vital Signs BP (!) 129/91    Pulse (!) 118    Temp 99.2 F (37.3 C)    Resp 20    SpO2 97%   Visual Acuity Right Eye Distance:   Left Eye Distance:   Bilateral Distance:    Right Eye Near:   Left Eye Near:    Bilateral Near:     Physical Exam Vitals reviewed.  HENT:     Head: Normocephalic and atraumatic.     Right  Ear: Tympanic membrane normal.     Left Ear: Tympanic membrane normal.     Mouth/Throat:     Mouth: Mucous membranes are moist.     Tonsils: 2+ on the right. 2+ on the left.  Eyes:     Conjunctiva/sclera: Conjunctivae normal.  Cardiovascular:     Rate and Rhythm: Normal rate.  Musculoskeletal:     Cervical back: Normal range of motion.  Skin:    General: Skin is warm.  Neurological:     Mental Status: She is alert.  Psychiatric:        Mood and Affect: Mood normal.     UC Treatments / Results  Labs (all labs ordered are listed, but only abnormal results are displayed) Labs Reviewed - No data to display  EKG   Radiology No results found.  Procedures Procedures (including critical care time)  Medications Ordered in UC Medications - No data to display  Initial Impression / Assessment and Plan / UC Course  I have reviewed the triage vital signs and the nursing notes.  Pertinent labs & imaging results that were available during my care of the patient were reviewed by me and considered in my medical decision making (see chart for details).     MDM: Pt given rx for omnicef.   Final Clinical Impressions(s) / UC Diagnoses   Final diagnoses:  Strep pharyngitis   Discharge Instructions   None    ED Prescriptions     Medication Sig Dispense Auth. Provider   cefdinir (OMNICEF) 300 MG capsule Take 1 capsule (300 mg total) by mouth 2 (two) times daily. 20 capsule Fransico Meadow, [place]      PDMP not reviewed this encounter.   Fransico Meadow, [place] 05/16/21 1524

## 2021-05-26 ENCOUNTER — Telehealth: Payer: Self-pay

## 2021-05-26 NOTE — Patient Outreach (Signed)
Care Coordination  05/26/2021  Linda Warner May 01, 1991 [id number]   Medicaid Managed Care   Unsuccessful Outreach Note  05/26/2021 Name: Linda Warner MRN: [id number] DOB: [birth year]  Referred by: Patient, No Pcp Per (Inactive) Reason for referral : High Risk Managed Medicaid (MM Social work [employer] manager)   An unsuccessful telephone outreach was attempted today. The patient was referred to the case management team for assistance with care management and care coordination.   Follow Up Plan: The care management team will reach out to the patient again over the next 7 days.   Mickel Fuchs, BSW, [place] Managed Medicaid Team  [phone]

## 2021-05-26 NOTE — Patient Instructions (Signed)
Visit Information  Ms. [name]  - as a part of your Medicaid benefit, you are eligible for care management and care coordination services at no cost or copay. I was unable to reach you by phone today but would be happy to help you with your health related needs. Please feel free to call me @ [phone].   A member of the Managed Medicaid care management team will reach out to you again over the next 7 days.   Mickel Fuchs, BSW, [place] Managed Medicaid Team  [phone]

## 2021-06-05 ENCOUNTER — Telehealth: Payer: Self-pay

## 2021-06-05 NOTE — Patient Outreach (Signed)
Care Coordination  06/05/2021  Linda Warner 08-Nov-1990 [id number]   Medicaid Managed Care   Unsuccessful Outreach Note  06/05/2021 Name: Linda Warner MRN: [id number] DOB: [birth year]  Referred by: Patient, No Pcp Per (Inactive) Reason for referral : High Risk Managed Medicaid (MM Social Work Unsuccessful [employer])   A second unsuccessful telephone outreach was attempted today. The patient was referred to the case management team for assistance with care management and care coordination.   Follow Up Plan: The care management team will reach out to the patient again over the next 7 days.   Mickel Fuchs, BSW, [place] Managed Medicaid Team  [phone]

## 2021-06-05 NOTE — Patient Instructions (Signed)
Visit Information  Ms. Linda Warner  - as a part of your Medicaid benefit, you are eligible for care management and care coordination services at no cost or copay. I was unable to reach you by phone today but would be happy to help you with your health related needs. Please feel free to call me @ [phone].   A member of the Managed Medicaid care management team will reach out to you again over the next 7 days.  Mickel Fuchs, BSW, [place] Managed Medicaid Team  [phone]

## 2021-06-13 ENCOUNTER — Telehealth: Payer: Self-pay

## 2021-06-13 NOTE — Patient Instructions (Signed)
Visit Information  Linda Warner was given information about Medicaid Managed Care team care coordination services as a part of their Healthy [facility] Medicaid benefit. Linda Warner verbally consented to engagement with the [facility] Managed Care team.   If you are experiencing a medical emergency, please call 911 or report to your local emergency department or urgent care.   If you have a non-emergency medical problem during routine business hours, please contact your provider's office and ask to speak with a nurse.   For questions related to your Healthy [facility] health plan, please call: [phone] or visit the homepage here: [email or web address]  If you would like to schedule transportation through your Healthy [facility] plan, please call the following number at least 2 days in advance of your appointment: [phone]  Call the [place] at [phone], at any time, 24 hours a day, 7 days a week. If you are in danger or need immediate medical attention call 911.  If you would like help to quit smoking, call 1-800-QUIT-NOW 304-864-4012) OR Espaol: 1-855-Djelo-Ya (9-758-832-5498) o para ms informacin haga clic aqu or Text READY to 200-400 to register via text  Linda Warner - following are the goals we discussed in your visit today:   Goals Addressed   None      The  Patient                                              has been provided with contact information for the Managed Medicaid care management team and has been advised to call with any health related questions or concerns.   Mickel Fuchs, BSW, [place] Managed Medicaid Team  [phone]   Following is a copy of your plan of care:  There are no care plans that you recently modified to display for this patient.

## 2021-06-13 NOTE — Patient Outreach (Signed)
°  Medicaid Managed Care Social Work Note  06/13/2021 Name:  Linda Warner MRN:  [id number] DOB:  07-19-1990  Linda Warner is an 31 y.o. year old female who is a primary Linda Warner of Linda Warner, No Pcp Per (Inactive).  The [facility] Managed Care Coordination team was consulted for assistance with:   PCP  Ms. Sara was given information about Medicaid Managed Care Coordination team services today. [name] Linda Warner agreed to services and verbal consent obtained.  Engaged with Linda Warner  for by telephone forinitial visit in response to referral for case management and/or care coordination services.   Assessments/Interventions:  Review of past medical history, allergies, medications, health status, including review of consultants reports, laboratory and other test data, was performed as part of comprehensive evaluation and provision of chronic care management services.  SDOH: (Social Determinant of Health) assessments and interventions performed: BSW completed telephone outreach to Linda Warner regarding a referral received for a PCP. BSW informed Linda Warner to contact [employer] to get the PCP information that was placed when she signed up for [employer]. No other resources needed at this time.  Advanced Directives Status:  Not addressed in this encounter.  Care Plan                 No Known Allergies  Medications Reviewed Today     Reviewed by Azalia Bilis, RN (Registered Nurse) on 05/09/21 at 1015  Med List Status: <None>   Medication Order Taking? Sig Documenting Provider Last Dose Status Informant  magic mouthwash (lidocaine, diphenhydrAMINE, alum & mag hydroxide) suspension [id number]  Swish and spit 5 mLs 3 (three) times daily as needed for mouth pain. Evlyn Courier, PA-C  Active   naproxen (NAPROSYN) 500 MG tablet [id number]  Take 1 tablet (500 mg total) by mouth 2 (two) times daily.  Linda Warner not taking: Reported on 05/08/2021   Scot Jun, FNP  Active Self  oxyCODONE-acetaminophen  (PERCOCET) 5-325 MG tablet [id number]  Take 1 tablet by mouth every 4 (four) hours as needed for severe pain.  Linda Warner not taking: Reported on 05/08/2021   Sidney Ace  Active Self  tiZANidine (ZANAFLEX) 2 MG tablet [id number]  Take 1-2 tablets (2-4 mg total) by mouth at bedtime.  Linda Warner not taking: Reported on 05/08/2021   Scot Jun, FNP  Active Self            Linda Warner Active Problem List   Diagnosis Date Noted   History of preterm delivery 09/28/2019   History of gestational hypertension 09/04/2019   Abnormal chromosomal and genetic finding on antenatal screening of mother 04/03/2019   Family history of sickle cell disease 03/20/2019   Postpartum depression 01/30/2013   SCOLIOSIS 07/25/2008   MOOD SWINGS 10/14/2006    Conditions to be addressed/monitored per PCP order:   PCP  There are no care plans that you recently modified to display for this Linda Warner.   Follow up:  Linda Warner requests no follow-up at this time.  Plan: The  Linda Warner has been provided with contact information for the Managed Medicaid care management team and has been advised to call with any health related questions or concerns.    Mickel Fuchs, BSW, [place] Managed Medicaid Team  [phone]

## 2021-07-04 ENCOUNTER — Other Ambulatory Visit: Payer: Self-pay

## 2021-07-04 ENCOUNTER — Encounter: Payer: Self-pay | Admitting: Emergency Medicine

## 2021-07-04 ENCOUNTER — Ambulatory Visit
Admission: EM | Admit: 2021-07-04 | Discharge: 2021-07-04 | Disposition: A | Discharge status: Home / Self Care | Payer: Medicaid Other | Attending: Urgent Care | Admitting: Urgent Care

## 2021-07-04 DIAGNOSIS — J029 Acute pharyngitis, unspecified: Secondary | ICD-10-CM

## 2021-07-04 DIAGNOSIS — R070 Pain in throat: Secondary | ICD-10-CM | POA: Insufficient documentation

## 2021-07-04 LAB — POCT RAPID STREP A (OFFICE): Rapid Strep A Screen: NEGATIVE

## 2021-07-04 MED ORDER — LEVOCETIRIZINE DIHYDROCHLORIDE 5 MG PO TABS
5.0000 mg | ORAL_TABLET | Freq: Every evening | ORAL | 0 refills | Status: DC
Start: 2021-07-04 — End: 2023-02-03

## 2021-07-04 MED ORDER — PSEUDOEPHEDRINE HCL 30 MG PO TABS: 30.0000 mg | ORAL_TABLET | Freq: Three times a day (TID) | ORAL | 0 refills | Status: DC | PRN

## 2021-07-04 NOTE — ED Provider Notes (Signed)
[place]   MRN: [id number] DOB: [birth year]  Subjective:   Linda Warner is a 31 y.o. female presenting for acute onset this morning of recurrent throat pain, painful swallowing, white patches on her throat.  Patient states that she gets frequent throat infections with strep.  Would like to get treated today.  Chart review shows that she last had this in December and took cefdinir.  Patient is requesting penicillin.  No fever, runny or stuffy nose, ear pain, cough, chest pain, shortness of breath.  No current facility-administered medications for this encounter.  Current Outpatient Medications:    cefdinir (OMNICEF) 300 MG capsule, Take 1 capsule (300 mg total) by mouth 2 (two) times daily., Disp: 20 capsule, Rfl: 0   magic mouthwash (lidocaine, diphenhydrAMINE, alum & mag hydroxide) suspension, Swish and spit 5 mLs 3 (three) times daily as needed for mouth pain., Disp: 360 mL, Rfl: 0   naproxen (NAPROSYN) 500 MG tablet, Take 1 tablet (500 mg total) by mouth 2 (two) times daily. (Patient not taking: Reported on 05/08/2021), Disp: 30 tablet, Rfl: 0   oxyCODONE-acetaminophen (PERCOCET) 5-325 MG tablet, Take 1 tablet by mouth every 4 (four) hours as needed for severe pain. (Patient not taking: Reported on 05/08/2021), Disp: 20 tablet, Rfl: 0   tiZANidine (ZANAFLEX) 2 MG tablet, Take 1-2 tablets (2-4 mg total) by mouth at bedtime. (Patient not taking: Reported on 05/08/2021), Disp: 30 tablet, Rfl: 0   No Known Allergies  Past Medical History:  Diagnosis Date   Acne    BV (bacterial vaginosis)    Gestational hypertension    Miscarriage    Post partum depression 01/30/2013   Pregnant    Scoliosis    Scoliosis      Past Surgical History:  Procedure Laterality Date   NO PAST SURGERIES      Family History  Problem Relation Age of Onset   Depression Father    Depression Maternal Grandmother    Diabetes Maternal Grandmother    Hyperlipidemia Maternal Grandmother     Hypertension Maternal Grandmother    Cancer Maternal Grandmother        breast   Sickle cell anemia Son    Anesthesia problems Neg Hx     Social History   Tobacco Use   Smoking status: Some Days    Packs/day: 0.25    Types: Cigarettes   Smokeless tobacco: Never  Vaping Use   Vaping Use: Never used  Substance Use Topics   Alcohol use: Yes    Comment: ocassional   Drug use: No    ROS   Objective:   Vitals: BP (!) 134/94 (BP Location: Right Arm)    Pulse (!) 103    Temp 99.2 F (37.3 C) (Oral)    Resp 18    Ht 5\' 3"  (1.6 m)    Wt 223 lb (101.2 kg)    LMP 06/06/2021 (Approximate)    SpO2 98%    Breastfeeding No    BMI 39.50 kg/m   Physical Exam Constitutional:      General: She is not in acute distress.    Appearance: Normal appearance. She is well-developed. She is not ill-appearing, toxic-appearing or diaphoretic.  HENT:     Head: Normocephalic and atraumatic.     Nose: Nose normal.     Mouth/Throat:     Mouth: Mucous membranes are moist.     Pharynx: No pharyngeal swelling, oropharyngeal exudate, posterior oropharyngeal erythema or uvula swelling.     Tonsils:  No tonsillar exudate or tonsillar abscesses. 1+ on the right. 1+ on the left.     Comments: Tonsilloliths noted over both tonsils bilaterally. Chronic tonsillar hypertrophy. Eyes:     General: No scleral icterus.       Right eye: No discharge.        Left eye: No discharge.     Extraocular Movements: Extraocular movements intact.  Cardiovascular:     Rate and Rhythm: Normal rate.  Pulmonary:     Effort: Pulmonary effort is normal.  Skin:    General: Skin is warm and dry.  Neurological:     General: No focal deficit present.     Mental Status: She is alert and oriented to person, place, and time.  Psychiatric:        Mood and Affect: Mood normal.        Behavior: Behavior normal.    Results for orders placed or performed during the hospital encounter of 07/04/21 (from the past 24 hour(s))  POCT  rapid strep A     Status: None   Collection Time: 07/04/21  6:52 PM  Result Value Ref Range   Rapid Strep A Screen Negative Negative    Assessment and Plan :   PDMP not reviewed this encounter.  1. Acute viral pharyngitis   2. Throat pain    Recommend supportive care.  Strep culture pending.  Patient declined COVID test. Counseled patient on potential for adverse effects with medications prescribed/recommended today, ER and return-to-clinic precautions discussed, patient verbalized understanding.    Jaynee Eagles, [place] 07/04/21 [id number]

## 2021-07-04 NOTE — ED Triage Notes (Signed)
Pt reports sore throat since this am. Pt reports noticed white patches to throat this am. Denies any known fevers.

## 2021-07-07 LAB — CULTURE, GROUP A STREP (THRC)

## 2021-08-29 ENCOUNTER — Encounter (HOSPITAL_COMMUNITY): Payer: Self-pay

## 2021-08-29 ENCOUNTER — Other Ambulatory Visit: Payer: Self-pay

## 2021-08-29 ENCOUNTER — Emergency Department (HOSPITAL_COMMUNITY)
Admission: EM | Admit: 2021-08-29 | Discharge: 2021-08-30 | Disposition: A | Discharge status: Home / Self Care | Payer: Medicaid Other | Attending: Emergency Medicine | Admitting: Emergency Medicine

## 2021-08-29 DIAGNOSIS — R519 Headache, unspecified: Secondary | ICD-10-CM | POA: Insufficient documentation

## 2021-08-29 NOTE — ED Triage Notes (Signed)
Pt arrived via POV c/o recurrent headache. Pt reports taking Ibuprofen in the past for relief, but this is no longer effective. Pt endorses mild nausea, and lightheadedness. NAD. ?

## 2021-08-30 MED ORDER — BUTALBITAL-APAP-CAFFEINE 50-325-40 MG PO TABS: 1.0000 | ORAL_TABLET | Freq: Four times a day (QID) | ORAL | 0 refills | Status: AC | PRN

## 2021-08-30 NOTE — ED Provider Notes (Signed)
?[place] ?Provider Note ? ? ?CSN: [id number] ?Arrival date & time: 08/29/21  2053 ? ?  ? ?History ? ?Chief Complaint  ?Patient presents with  ? Headache  ? ? ?Linda Warner is a 31 y.o. female. ? ?31 year old female who presents to the ER for headaches.  Patient states that she has had headaches pretty consistently for the last month to month and a half.  She states that they come on almost daily.  They can be frontal, temporal or posterior.  She states that there is no real obvious pattern to them.  She has no neurologic changes with them.  She is not nauseous.  Is no photosensitivity or sound sensitivity.  No fevers.  She states that she quit smoking about a month ago but she stated headache started before that.  No changes in diet or medications.  No other systemic changes.  She is worried because her cousin had a brain aneurysm at a young age.  No other family history of aneurysms. ? ? ?Headache ? ?  ? ?Home Medications ?Prior to Admission medications   ?Medication Sig Start Date End Date Taking? Authorizing Provider  ?butalbital-acetaminophen-caffeine (FIORICET) 50-325-40 MG tablet Take 1-2 tablets by mouth every 6 (six) hours as needed for headache. 08/30/21 08/30/22 Yes Verlyn Lambert, Corene Cornea, MD  ?levocetirizine (XYZAL) 5 MG tablet Take 1 tablet (5 mg total) by mouth every evening. 07/04/21   Jaynee Eagles, PA-C  ?magic mouthwash (lidocaine, diphenhydrAMINE, alum & mag hydroxide) suspension Swish and spit 5 mLs 3 (three) times daily as needed for mouth pain. 05/08/21   Evlyn Courier, PA-C  ?pseudoephedrine (SUDAFED) 30 MG tablet Take 1 tablet (30 mg total) by mouth every 8 (eight) hours as needed for congestion. 07/04/21   Jaynee Eagles, PA-C  ?   ? ?Allergies    ?Patient has no known allergies.   ? ?Review of Systems   ?Review of Systems  ?Neurological:  Positive for headaches.  ? ?Physical Exam ?Updated Vital Signs ?BP (!) 118/107 (BP Location: Right Arm)   Pulse 80   Temp 97.8 ?F (36.6 ?C) (Oral)    Resp 18   Ht '5\' 3"'$  (1.6 m)   Wt 103 kg   LMP 08/04/2021 (Exact Date)   SpO2 100%   BMI 40.21 kg/m?  ?Physical Exam ?Vitals and nursing note reviewed.  ?Constitutional:   ?   Appearance: She is well-developed.  ?HENT:  ?   Head: Normocephalic and atraumatic.  ?Cardiovascular:  ?   Rate and Rhythm: Normal rate and regular rhythm.  ?Pulmonary:  ?   Effort: No respiratory distress.  ?   Breath sounds: No stridor.  ?Abdominal:  ?   General: There is no distension.  ?Musculoskeletal:     ?   General: No swelling or tenderness. Normal range of motion.  ?   Cervical back: Normal range of motion.  ?Skin: ?   General: Skin is warm and dry.  ?Neurological:  ?   Mental Status: She is alert and oriented to person, place, and time. Mental status is at baseline.  ?   Comments: Ambulates without difficulty.  Raise eyebrows, extraocular movements, smile is all symmetric.  Uvula is midline.  Tongue protrudes midline.  Symmetric flexion and extension of arms and grip strength and at shoulders.  Symmetric leg raise, dorsiflexion and plantarflexion bilaterally.  Symmetric sensation to light touch in distal extremities.  ? ? ?ED Results / Procedures / Treatments   ?Labs ?(all labs ordered are listed,  but only abnormal results are displayed) ?Labs Reviewed - No data to display ? ?EKG ?None ? ?Radiology ?No results found. ? ?Procedures ?Procedures  ? ? ?Medications Ordered in ED ?Medications - No data to display ? ?ED Course/ Medical Decision Making/ A&P ?  ?                        ?Medical Decision Making ?Risk ?Prescription drug management. ? ? ?Secondary to the prolonged nature of her headaches offered to do CT scan to evaluate for aneurysm.  She asked my opinion which I did not think it was high likelihood with her description of symptoms and that there is probably some other underlying cause for headaches.  Does not seem consistent with tension, migraine, temporal arteritis headache.  Could be cluster headache.  Either way  without neurologic changes, consistent pattern, risk factors and feel the aneurysm was high and the likelihood but was willing to do the CT scan to help rule out those emergent causes for further follow-up.  Patient stated if I did not think that that was what was going on she did not feel the need for CT scan which I did agree with.  We will start with some outpatient medications and neurology follow-up.  She knows to return here for any new or worsening symptoms ? ?Final Clinical Impression(s) / ED Diagnoses ?Final diagnoses:  ?Nonintractable headache, unspecified chronicity pattern, unspecified headache type  ? ? ?Rx / DC Orders ?ED Discharge Orders   ? ?      Ordered  ?  butalbital-acetaminophen-caffeine (FIORICET) 50-325-40 MG tablet  Every 6 hours PRN       ? 08/30/21 0003  ?  Ambulatory referral to Neurology       ?Comments: An appointment is requested in approximately: 2 weeks  ? 08/30/21 0004  ? ?  ?  ? ?  ? ? ?  ?Merrily Pew, MD ?08/30/21 0217 ? ?

## 2021-09-01 ENCOUNTER — Telehealth: Payer: Self-pay

## 2021-09-01 NOTE — Telephone Encounter (Signed)
Transition Care Management Follow-up Telephone Call ?Date of discharge and from where: 08/30/2021 from [facility] ?How have you been since you were released from the hospital? Patient stated that she is feeling better and did not have any questions or concerns at this time.  ?Any questions or concerns? No ? ?Items Reviewed: ?Did the pt receive and understand the discharge instructions provided? Yes  ?Medications obtained and verified? Yes  ?Other? No  ?Any new allergies since your discharge? No  ?Dietary orders reviewed? No ?Do you have support at home? Yes  ? ?Functional Questionnaire: (I = Independent and D = Dependent) ?ADLs: I ? ?Bathing/Dressing- I ? ?Meal Prep- I ? ?Eating- I ? ?Maintaining continence- I ? ?Transferring/Ambulation- I ? ?Managing Meds- I ? ? ?Follow up appointments reviewed: ? ?PCP Hospital f/u appt confirmed? No   ?Specialist Hospital f/u appt confirmed? No  Recommendation to follow up with Neurology. Patient stated that she would call today when she gets off work.  ?Are transportation arrangements needed? No  ?If their condition worsens, is the pt aware to call PCP or go to the Emergency Dept.? Yes ?Was the patient provided with contact information for the PCP's office or ED? Yes ?Was to pt encouraged to call back with questions or concerns? Yes ? ?

## 2021-09-02 ENCOUNTER — Emergency Department (HOSPITAL_COMMUNITY)
Admission: EM | Admit: 2021-09-02 | Discharge: 2021-09-02 | Disposition: A | Discharge status: Home / Self Care | Payer: Medicaid Other | Attending: Emergency Medicine | Admitting: Emergency Medicine

## 2021-09-02 ENCOUNTER — Other Ambulatory Visit: Payer: Self-pay

## 2021-09-02 ENCOUNTER — Encounter (HOSPITAL_COMMUNITY): Payer: Self-pay

## 2021-09-02 DIAGNOSIS — R519 Headache, unspecified: Secondary | ICD-10-CM | POA: Diagnosis not present

## 2021-09-02 DIAGNOSIS — H6691 Otitis media, unspecified, right ear: Secondary | ICD-10-CM | POA: Diagnosis not present

## 2021-09-02 DIAGNOSIS — H6690 Otitis media, unspecified, unspecified ear: Secondary | ICD-10-CM

## 2021-09-02 DIAGNOSIS — H9201 Otalgia, right ear: Secondary | ICD-10-CM | POA: Diagnosis present

## 2021-09-02 MED ORDER — ACETAMINOPHEN 500 MG PO TABS
1000.0000 mg | ORAL_TABLET | Freq: Once | ORAL | Status: AC
  Administered 2021-09-02: 1000 mg via ORAL
  Filled 2021-09-02: qty 2

## 2021-09-02 MED ORDER — AMOXICILLIN-POT CLAVULANATE 875-125 MG PO TABS: 1.0000 | ORAL_TABLET | Freq: Two times a day (BID) | ORAL | 0 refills | Status: DC

## 2021-09-02 NOTE — ED Triage Notes (Signed)
Pt c/o right ear pain, states she's tried OTC pain meds and ear drops with no relief.  ?

## 2021-09-02 NOTE — ED Provider Notes (Signed)
?[place] ?Provider Note ? ? ?CSN: [id number] ?Arrival date & time: 09/02/21  2056 ? ?  ? ?History ? ?Chief Complaint  ?Patient presents with  ? Otalgia  ? ? ?Linda Warner is a 31 y.o. female.  Patient presents to the emergency department complaining of right ear pain.  Patient also complains of some pain in the right side of her neck.  No other complaints at this time. ? ?HPI ? ?  ? ?Home Medications ?Prior to Admission medications   ?Medication Sig Start Date End Date Taking? Authorizing Provider  ?amoxicillin-clavulanate (AUGMENTIN) 875-125 MG tablet Take 1 tablet by mouth every 12 (twelve) hours. 09/02/21  Yes Dorothyann Peng, PA-C  ?butalbital-acetaminophen-caffeine (FIORICET) 50-325-40 MG tablet Take 1-2 tablets by mouth every 6 (six) hours as needed for headache. 08/30/21 08/30/22  Mesner, Corene Cornea, MD  ?levocetirizine (XYZAL) 5 MG tablet Take 1 tablet (5 mg total) by mouth every evening. 07/04/21   Jaynee Eagles, PA-C  ?magic mouthwash (lidocaine, diphenhydrAMINE, alum & mag hydroxide) suspension Swish and spit 5 mLs 3 (three) times daily as needed for mouth pain. 05/08/21   Evlyn Courier, PA-C  ?pseudoephedrine (SUDAFED) 30 MG tablet Take 1 tablet (30 mg total) by mouth every 8 (eight) hours as needed for congestion. 07/04/21   Jaynee Eagles, PA-C  ?   ? ?Allergies    ?Patient has no known allergies.   ? ?Review of Systems   ?Review of Systems  ?HENT:  Positive for ear pain. Negative for ear discharge and facial swelling.   ?Musculoskeletal:  Positive for neck pain.  ? ?Physical Exam ?Updated Vital Signs ?BP (!) 142/92 (BP Location: Left Arm)   Pulse 94   Temp 98.4 ?F (36.9 ?C) (Oral)   Resp 18   Ht '5\' 3"'$  (1.6 m)   Wt 104.3 kg   LMP 08/27/2021 (Exact Date)   SpO2 100%   BMI 40.74 kg/m?  ?Physical Exam ?Constitutional:   ?   Appearance: She is obese.  ?HENT:  ?   Head: Normocephalic and atraumatic.  ?   Ears:  ?   Comments: Erythematous right TM ?   Mouth/Throat:  ?   Mouth: Mucous  membranes are moist.  ?Eyes:  ?   Pupils: Pupils are equal, round, and reactive to light.  ?Musculoskeletal:     ?   General: Tenderness (Mild tenderness to palpation of the right side of the neck) present.  ?Neurological:  ?   Mental Status: She is alert.  ? ? ?ED Results / Procedures / Treatments   ?Labs ?(all labs ordered are listed, but only abnormal results are displayed) ?Labs Reviewed - No data to display ? ?EKG ?None ? ?Radiology ?No results found. ? ?Procedures ?Procedures  ? ? ?Medications Ordered in ED ?Medications  ?acetaminophen (TYLENOL) tablet 1,000 mg (has no administration in time range)  ? ? ?ED Course/ Medical Decision Making/ A&P ?  ?                        ?Medical Decision Making ?Risk ?OTC drugs. ? ? ?The patient presents with concerns of right-sided ear pain.  Differential includes otitis media, otitis externa, mastoiditis, and others.  Examination shows signs of otitis media.  Plan on treating with antibiotics.   ? ?I ordered a dose of Tylenol to help with the patient's pain. The patient was reassessed and had a mild improvement in symptoms. ? ?Discharge home  ? ? ?Final Clinical Impression(s) /  ED Diagnoses ?Final diagnoses:  ?Acute otitis media, unspecified otitis media type  ? ? ?Rx / DC Orders ?ED Discharge Orders   ? ?      Ordered  ?  amoxicillin-clavulanate (AUGMENTIN) 875-125 MG tablet  Every 12 hours       ? 09/02/21 2222  ? ?  ?  ? ?  ? ? ?  ?Dorothyann Peng, PA-C ?09/02/21 2225 ? ?  ?Milton Ferguson, MD ?09/03/21 1138 ? ?

## 2021-09-02 NOTE — Discharge Instructions (Addendum)
You were diagnosed today with with an infection of the inner ear.  I prescribed antibiotics.  Please take the full course of antibiotics.  Recommend ibuprofen and Tylenol for pain. Recommend establishing care with a primary care provider ?

## 2021-09-03 ENCOUNTER — Telehealth: Payer: Self-pay

## 2021-09-03 NOTE — Telephone Encounter (Signed)
Transition Care Management Follow-up Telephone Call ?Date of discharge and from where: 09/02/2021-[place]  ?How have you been since you were released from the hospital? Pt stated she is not doing the best and has not picked up her medications yet but she will pick it up today.  ?Any questions or concerns? No ? ?Items Reviewed: ?Did the pt receive and understand the discharge instructions provided? Yes  ?Medications obtained and verified? Yes  ?Other? No  ?Any new allergies since your discharge? No  ?Dietary orders reviewed? No ?Do you have support at home? Yes  ? ?Home Care and Equipment/Supplies: ?Were home health services ordered? not applicable ?If so, what is the name of the agency? N/A  ?Has the agency set up a time to come to the patient's home? not applicable ?Were any new equipment or medical supplies ordered?  No ?What is the name of the medical supply agency? N/A ?Were you able to get the supplies/equipment? not applicable ?Do you have any questions related to the use of the equipment or supplies? No ? ?Functional Questionnaire: (I = Independent and D = Dependent) ?ADLs: I ? ?Bathing/Dressing- I ? ?Meal Prep- I ? ?Eating- I ? ?Maintaining continence- I ? ?Transferring/Ambulation- I ? ?Managing Meds- I ? ?Follow up appointments reviewed: ? ?PCP Hospital f/u appt confirmed? No   ?Specialist Hospital f/u appt confirmed? No   ?Are transportation arrangements needed? No  ?If their condition worsens, is the pt aware to call PCP or go to the Emergency Dept.? Yes ?Was the patient provided with contact information for the PCP's office or ED? Yes ?Was to pt encouraged to call back with questions or concerns? Yes  ?

## 2022-04-28 ENCOUNTER — Encounter: Payer: Self-pay | Admitting: Emergency Medicine

## 2022-04-28 ENCOUNTER — Ambulatory Visit
Admission: EM | Admit: 2022-04-28 | Discharge: 2022-04-28 | Disposition: A | Discharge status: Home / Self Care | Payer: Medicaid Other | Attending: Family Medicine | Admitting: Family Medicine

## 2022-04-28 ENCOUNTER — Other Ambulatory Visit: Payer: Self-pay

## 2022-04-28 DIAGNOSIS — Z1152 Encounter for screening for COVID-19: Secondary | ICD-10-CM | POA: Insufficient documentation

## 2022-04-28 DIAGNOSIS — Z79899 Other long term (current) drug therapy: Secondary | ICD-10-CM | POA: Insufficient documentation

## 2022-04-28 DIAGNOSIS — R52 Pain, unspecified: Secondary | ICD-10-CM | POA: Diagnosis not present

## 2022-04-28 DIAGNOSIS — J069 Acute upper respiratory infection, unspecified: Secondary | ICD-10-CM | POA: Diagnosis not present

## 2022-04-28 DIAGNOSIS — R059 Cough, unspecified: Secondary | ICD-10-CM | POA: Insufficient documentation

## 2022-04-28 LAB — RESP PANEL BY RT-PCR (FLU A&B, COVID) ARPGX2
Influenza A by PCR: NEGATIVE
Influenza B by PCR: NEGATIVE
SARS Coronavirus 2 by RT PCR: NEGATIVE

## 2022-04-28 MED ORDER — PROMETHAZINE-DM 6.25-15 MG/5ML PO SYRP: 5.0000 mL | ORAL_SOLUTION | Freq: Four times a day (QID) | ORAL | 0 refills | Status: DC | PRN

## 2022-04-28 MED ORDER — FLUTICASONE PROPIONATE 50 MCG/ACT NA SUSP
1.0000 | Freq: Two times a day (BID) | NASAL | 2 refills | Status: DC
Start: 2022-04-28 — End: 2023-02-03

## 2022-04-28 NOTE — ED Provider Notes (Signed)
[facility]    CSN: [id number] Arrival date & time: 04/28/22  0808      History   Chief Complaint Chief Complaint  Patient presents with   Cough    HPI Linda Warner is a 31 y.o. female.   Presenting today with several day history of cough, sore throat, runny nose, congestion, hoarseness.  Denies fever, chest pain, shortness of breath, abdominal pain, nausea vomiting or diarrhea.  Trying over-the-counter cold and congestion medications with minimal relief.  Multiple sick contacts recently.    Past Medical History:  Diagnosis Date   Acne    BV (bacterial vaginosis)    Gestational hypertension    Miscarriage    Post partum depression 01/30/2013   Pregnant    Scoliosis    Scoliosis     Patient Active Problem List   Diagnosis Date Noted   History of preterm delivery 09/28/2019   History of gestational hypertension 09/04/2019   Abnormal chromosomal and genetic finding on antenatal screening of mother 04/03/2019   Family history of sickle cell disease 03/20/2019   Postpartum depression 01/30/2013   SCOLIOSIS 07/25/2008   MOOD SWINGS 10/14/2006    Past Surgical History:  Procedure Laterality Date   NO PAST SURGERIES      OB History     Gravida  6   Para  4   Term  3   Preterm  1   AB  1   Living  2      SAB  1   IAB  0   Ectopic  0   Multiple      Live Births  2            Home Medications    Prior to Admission medications   Medication Sig Start Date End Date Taking? Authorizing Provider  fluticasone (FLONASE) 50 MCG/ACT nasal spray Place 1 spray into both nostrils 2 (two) times daily. 04/28/22  Yes Volney American, PA-C  promethazine-dextromethorphan (PROMETHAZINE-DM) 6.25-15 MG/5ML syrup Take 5 mLs by mouth 4 (four) times daily as needed. 04/28/22  Yes Volney American, PA-C  amoxicillin-clavulanate (AUGMENTIN) 875-125 MG tablet Take 1 tablet by mouth every 12 (twelve) hours. 09/02/21   Dorothyann Peng,  PA-C  butalbital-acetaminophen-caffeine (FIORICET) [phone] MG tablet Take 1-2 tablets by mouth every 6 (six) hours as needed for headache. 08/30/21 08/30/22  Mesner, Corene Cornea, MD  levocetirizine (XYZAL) 5 MG tablet Take 1 tablet (5 mg total) by mouth every evening. 07/04/21   Jaynee Eagles, PA-C  magic mouthwash (lidocaine, diphenhydrAMINE, alum & mag hydroxide) suspension Swish and spit 5 mLs 3 (three) times daily as needed for mouth pain. 05/08/21   Evlyn Courier, PA-C  pseudoephedrine (SUDAFED) 30 MG tablet Take 1 tablet (30 mg total) by mouth every 8 (eight) hours as needed for congestion. 07/04/21   Jaynee Eagles, PA-C    Family History Family History  Problem Relation Age of Onset   Depression Father    Depression Maternal Grandmother    Diabetes Maternal Grandmother    Hyperlipidemia Maternal Grandmother    Hypertension Maternal Grandmother    Cancer Maternal Grandmother        breast   Sickle cell anemia Son    Anesthesia problems Neg Hx     Social History Social History   Tobacco Use   Smoking status: Some Days    Packs/day: 0.25    Types: Cigarettes   Smokeless tobacco: Never  Vaping Use   Vaping Use: Never used  Substance Use Topics   Alcohol use: Yes    Comment: ocassional   Drug use: No     Allergies   Patient has no known allergies.   Review of Systems Review of Systems PER HPI  Physical Exam Triage Vital Signs ED Triage Vitals [04/28/22 0829]  Enc Vitals Group     BP 126/89     Pulse Rate 98     Resp 20     Temp 98.3 F (36.8 C)     Temp Source Oral     SpO2 97 %     Weight      Height      Head Circumference      Peak Flow      Pain Score 6     Pain Loc      Pain Edu?      Excl. in [place]?    No data found.  Updated Vital Signs BP 126/89 (BP Location: Right Arm)   Pulse 98   Temp 98.3 F (36.8 C) (Oral)   Resp 20   LMP 04/17/2022 (Approximate)   SpO2 97%   Visual Acuity Right Eye Distance:   Left Eye Distance:   Bilateral Distance:     Right Eye Near:   Left Eye Near:    Bilateral Near:     Physical Exam Vitals and nursing note reviewed.  Constitutional:      Appearance: Normal appearance.  HENT:     Head: Atraumatic.     Right Ear: Tympanic membrane and external ear normal.     Left Ear: Tympanic membrane and external ear normal.     Nose: Rhinorrhea present.     Mouth/Throat:     Mouth: Mucous membranes are moist.     Pharynx: Posterior oropharyngeal erythema present.  Eyes:     Extraocular Movements: Extraocular movements intact.     Conjunctiva/sclera: Conjunctivae normal.  Cardiovascular:     Rate and Rhythm: Normal rate and regular rhythm.     Heart sounds: Normal heart sounds.  Pulmonary:     Effort: Pulmonary effort is normal.     Breath sounds: Normal breath sounds. No wheezing or rales.  Musculoskeletal:        General: Normal range of motion.     Cervical back: Normal range of motion and neck supple.  Skin:    General: Skin is warm and dry.  Neurological:     Mental Status: She is alert and oriented to person, place, and time.  Psychiatric:        Mood and Affect: Mood normal.        Thought Content: Thought content normal.      UC Treatments / Results  Labs (all labs ordered are listed, but only abnormal results are displayed) Labs Reviewed  RESP PANEL BY RT-PCR (FLU A&B, COVID) ARPGX2    EKG   Radiology No results found.  Procedures Procedures (including critical care time)  Medications Ordered in UC Medications - No data to display  Initial Impression / Assessment and Plan / UC Course  I have reviewed the triage vital signs and the nursing notes.  Pertinent labs & imaging results that were available during my care of the patient were reviewed by me and considered in my medical decision making (see chart for details).     Respiratory panel pending, suspect viral upper respiratory infection.  Treat with Phenergan DM, Flonase, supportive over-the-counter medications  and home care.  Return for worsening symptoms.  Final  Clinical Impressions(s) / UC Diagnoses   Final diagnoses:  Viral URI with cough  Generalized body aches   Discharge Instructions   None    ED Prescriptions     Medication Sig Dispense Auth. Provider   promethazine-dextromethorphan (PROMETHAZINE-DM) 6.25-15 MG/5ML syrup Take 5 mLs by mouth 4 (four) times daily as needed. 100 mL Volney American, PA-C   fluticasone [facility]) 50 MCG/ACT nasal spray Place 1 spray into both nostrils 2 (two) times daily. 16 g Volney American, [place]      PDMP not reviewed this encounter.   Volney American, [place] 04/28/22 [phone]

## 2022-04-28 NOTE — ED Triage Notes (Addendum)
Pt reports cough, sore throat, runny nose, head congestion,hoarseness since Thursday. Denies any known fevers but reports chills. Has tried otc medication with no change in symptoms.

## 2022-06-04 ENCOUNTER — Other Ambulatory Visit: Payer: Self-pay

## 2022-06-04 ENCOUNTER — Encounter (HOSPITAL_COMMUNITY): Payer: Self-pay | Admitting: Emergency Medicine

## 2022-06-04 ENCOUNTER — Emergency Department (HOSPITAL_COMMUNITY)
Admission: EM | Admit: 2022-06-04 | Discharge: 2022-06-04 | Disposition: A | Discharge status: Home / Self Care | Payer: Medicaid Other | Attending: Emergency Medicine | Admitting: Emergency Medicine

## 2022-06-04 DIAGNOSIS — F17210 Nicotine dependence, cigarettes, uncomplicated: Secondary | ICD-10-CM | POA: Diagnosis not present

## 2022-06-04 DIAGNOSIS — U071 COVID-19: Secondary | ICD-10-CM | POA: Insufficient documentation

## 2022-06-04 DIAGNOSIS — R059 Cough, unspecified: Secondary | ICD-10-CM | POA: Diagnosis present

## 2022-06-04 LAB — RESP PANEL BY RT-PCR (RSV, FLU A&B, COVID)  RVPGX2
Influenza A by PCR: NEGATIVE
Influenza B by PCR: NEGATIVE
Resp Syncytial Virus by PCR: NEGATIVE
SARS Coronavirus 2 by RT PCR: POSITIVE — AB

## 2022-06-04 MED ORDER — ACETAMINOPHEN 325 MG PO TABS
650.0000 mg | ORAL_TABLET | Freq: Once | ORAL | Status: AC
Start: 2022-06-04 — End: 2022-06-04
  Administered 2022-06-04: 650 mg via ORAL
  Filled 2022-06-04: qty 2

## 2022-06-04 MED ORDER — IBUPROFEN 400 MG PO TABS
400.0000 mg | ORAL_TABLET | Freq: Once | ORAL | Status: AC
  Administered 2022-06-04: 400 mg via ORAL
  Filled 2022-06-04: qty 1

## 2022-06-04 MED ORDER — IPRATROPIUM-ALBUTEROL 0.5-2.5 (3) MG/3ML IN SOLN
3.0000 mL | Freq: Once | RESPIRATORY_TRACT | Status: AC
  Administered 2022-06-04: 3 mL via RESPIRATORY_TRACT
  Filled 2022-06-04: qty 3

## 2022-06-04 MED ORDER — PREDNISONE 50 MG PO TABS
60.0000 mg | ORAL_TABLET | Freq: Once | ORAL | Status: AC
  Administered 2022-06-04: 60 mg via ORAL
  Filled 2022-06-04: qty 1

## 2022-06-04 MED ORDER — ALBUTEROL SULFATE HFA 108 (90 BASE) MCG/ACT IN AERS: 2.0000 | INHALATION_SPRAY | RESPIRATORY_TRACT | 0 refills | Status: DC | PRN

## 2022-06-04 MED ORDER — PREDNISONE 50 MG PO TABS: 50.0000 mg | ORAL_TABLET | Freq: Every day | ORAL | 0 refills | Status: DC

## 2022-06-04 NOTE — ED Provider Notes (Signed)
[facility] EMERGENCY DEPARTMENT Provider Note   CSN: [id number] Arrival date & time: 06/04/22  0402     History  Chief Complaint  Patient presents with   Flu Like Symptoms    Linda Warner is a 32 y.o. female.  The history is provided by the patient.  She has history of gestational hypertension and comes in with onset yesterday of clear rhinorrhea, nonproductive cough, posttussive emesis, generalized bodyaches.  She has had chills but no known fever or sweats.  She had a similar illness last month at which time she tested negative for influenza and COVID-19.  She denies any sick contacts.  She is a cigarette smoker.  She has taken DayQuil without relief.   Home Medications Prior to Admission medications   Medication Sig Start Date End Date Taking? Authorizing Provider  amoxicillin-clavulanate (AUGMENTIN) 875-125 MG tablet Take 1 tablet by mouth every 12 (twelve) hours. 09/02/21   Dorothyann Peng, PA-C  butalbital-acetaminophen-caffeine (FIORICET) [id number] MG tablet Take 1-2 tablets by mouth every 6 (six) hours as needed for headache. 08/30/21 08/30/22  Mesner, Corene Cornea, MD  fluticasone (FLONASE) 50 MCG/ACT nasal spray Place 1 spray into both nostrils 2 (two) times daily. 04/28/22   Volney American, PA-C  levocetirizine (XYZAL) 5 MG tablet Take 1 tablet (5 mg total) by mouth every evening. 07/04/21   Jaynee Eagles, PA-C  magic mouthwash (lidocaine, diphenhydrAMINE, alum & mag hydroxide) suspension Swish and spit 5 mLs 3 (three) times daily as needed for mouth pain. 05/08/21   Evlyn Courier, PA-C  promethazine-dextromethorphan (PROMETHAZINE-DM) 6.25-15 MG/5ML syrup Take 5 mLs by mouth 4 (four) times daily as needed. 04/28/22   Volney American, PA-C  pseudoephedrine (SUDAFED) 30 MG tablet Take 1 tablet (30 mg total) by mouth every 8 (eight) hours as needed for congestion. 07/04/21   Jaynee Eagles, PA-C      Allergies    Patient has no known allergies.    Review of Systems    Review of Systems  All other systems reviewed and are negative.   Physical Exam Updated Vital Signs BP (!) 140/88   Pulse (!) 118   Temp (!) 100.6 F (38.1 C) (Oral)   Resp 17   Ht '5\' 3"'$  (1.6 m)   Wt 104.3 kg   SpO2 94%   BMI 40.73 kg/m  Physical Exam Vitals and nursing note reviewed.   32 year old female, resting comfortably and in no acute distress. Vital signs are significant for elevated temperature, heart rate, blood pressure. Oxygen saturation is 94%, which is normal. Head is normocephalic and atraumatic. PERRLA, EOMI. Oropharynx is clear. Neck is nontender and supple without adenopathy or JVD. Back is nontender and there is no CVA tenderness. Lungs have a prolonged exhalation phase without any overt rales, wheezes, or rhonchi. Chest is nontender. Heart has regular rate and rhythm without murmur. Abdomen is soft, flat, nontender. Extremities have no cyanosis or edema, full range of motion is present. Skin is warm and dry without rash. Neurologic: Mental status is normal, cranial nerves are intact, moves all extremities equally.  ED Results / Procedures / Treatments   Labs (all labs ordered are listed, but only abnormal results are displayed) Labs Reviewed  RESP PANEL BY RT-PCR (RSV, FLU A&B, COVID)  RVPGX2 - Abnormal; Notable for the following components:      Result Value   SARS Coronavirus 2 by RT PCR POSITIVE (*)    All other components within normal limits   Procedures Procedures  Medications Ordered in ED Medications  acetaminophen (TYLENOL) tablet 650 mg (650 mg Oral Given 06/04/22 0446)  ibuprofen (ADVIL) tablet 400 mg (400 mg Oral Given 06/04/22 0446)  ipratropium-albuterol (DUONEB) 0.5-2.5 (3) MG/3ML nebulizer solution 3 mL (3 mLs Nebulization Given 06/04/22 0514)  predniSONE (DELTASONE) tablet 60 mg (60 mg Oral Given 06/04/22 0645)    ED Course/ Medical Decision Making/ A&P                             Medical Decision Making Risk OTC  drugs. Prescription drug management.   Influenza-like illness.  Consider influenza, COVID-19, RSV, other respiratory viruses.  I have ordered a respiratory pathogen panel.  I have ordered a trial of albuterol and ipratropium via nebulizer to try to help with her cough.  I have ordered a dose of ibuprofen and acetaminophen to help with pain.  I have reviewed and interpreted her laboratory tests, and my interpretation is positive PCR for COVID-19.  Patient had good symptomatic relief with albuterol and ipratropium nebulizer.  I have offered Paxlovid prescription.  However, as patient is not at high risk for hospitalization, potential gain is minimal and through shared decision making, decision was made to not prescribe antiviral medication.  Ordered a dose of prednisone and I am discharging her with prescriptions for prednisone and albuterol inhaler.  Final Clinical Impression(s) / ED Diagnoses Final diagnoses:  [id number] virus infection    Rx / DC Orders ED Discharge Orders          Ordered    albuterol (VENTOLIN HFA) 108 (90 Base) MCG/ACT inhaler  Every 4 hours PRN        06/04/22 0639    predniSONE (DELTASONE) 50 MG tablet  Daily        06/04/22 6415              Delora Fuel, MD 83/09/40 [phone]

## 2022-06-04 NOTE — ED Triage Notes (Signed)
Pt concerned she may have flu or COVID. Pt c/o nausea, vomiting, fever, body aches, chills and headache since yesterday. Pt took dayquil with no relief.

## 2022-06-04 NOTE — Discharge Instructions (Signed)
Drink plenty of fluids.  Take acetaminophen and/your ibuprofen as needed for fever or aching.  Use the inhaler-2 puffs at a time, every 4 hours as needed to help with your coughing and breathing.  Return if you have any new or concerning symptoms.

## 2023-02-03 ENCOUNTER — Encounter: Payer: Medicaid Other | Admitting: Family Medicine

## 2023-02-03 ENCOUNTER — Encounter: Payer: Self-pay | Admitting: Family Medicine

## 2023-02-03 NOTE — Patient Instructions (Signed)
Due for pap

## 2023-02-03 NOTE — Progress Notes (Deleted)
New Patient Office Visit  Subjective:  Patient ID: Linda Warner, female    DOB: 10-08-1990  Age: 32 y.o. MRN: [id number]  CC:  Chief Complaint  Patient presents with   New Patient (Initial Visit)    Establishing care. Pt reports headaches on and off. Seasonal allergies and tonsillitis get worse with weather change needs something prescribed to have.     HPI Linda Warner is a 32 y.o. female with past medical history of *** presents for establishing care.  Past Medical History:  Diagnosis Date   Acne    BV (bacterial vaginosis)    Gestational hypertension    Miscarriage    Post partum depression 01/30/2013   Pregnant    Scoliosis    Scoliosis     Past Surgical History:  Procedure Laterality Date   NO PAST SURGERIES      Family History  Problem Relation Age of Onset   Depression Father    Depression Maternal Grandmother    Diabetes Maternal Grandmother    Hyperlipidemia Maternal Grandmother    Hypertension Maternal Grandmother    Cancer Maternal Grandmother        breast   Sickle cell anemia Son    Anesthesia problems Neg Hx     Social History   Socioeconomic History   Marital status: Married    Spouse name: Darrel Holtmeyer   Number of children: 2   Years of education: Not on file   Highest education level: High school graduate  Occupational History   Not on file  Tobacco Use   Smoking status: Some Days    Current packs/day: 0.25    Types: Cigarettes   Smokeless tobacco: Never  Vaping Use   Vaping status: Never Used  Substance and Sexual Activity   Alcohol use: Yes    Comment: ocassional   Drug use: No   Sexual activity: Yes    Birth control/protection: None  Other Topics Concern   Not on file  Social History Narrative   ** Merged History Encounter **       Social Determinants of Health   Financial Resource Strain: Low Risk  (09/28/2019)   Overall Financial Resource Strain (CARDIA)    Difficulty of Paying Living Expenses: Not very hard  Food  Insecurity: No Food Insecurity (09/28/2019)   Hunger Vital Sign    Worried About Running Out of Food in the Last Year: Never true    Ran Out of Food in the Last Year: Never true  Transportation Needs: No Transportation Needs (09/28/2019)   PRAPARE - Administrator, Civil Service (Medical): No    Lack of Transportation (Non-Medical): No  Physical Activity: Inactive (09/28/2019)   Exercise Vital Sign    Days of Exercise per Week: 0 days    Minutes of Exercise per Session: 0 min  Stress: No Stress Concern Present (09/28/2019)   [employer] of Occupational Health - Occupational Stress Questionnaire    Feeling of Stress : Only a little  Social Connections: Moderately Isolated (09/28/2019)   Social Connection and Isolation Panel [NHANES]    Frequency of Communication with Friends and Family: More than three times a week    Frequency of Social Gatherings with Friends and Family: Once a week    Attends Religious Services: 1 to 4 times per year    Active Member of [employer] or Organizations: No    Attends Banker Meetings: Never    Marital Status: Separated  Intimate Programme researcher, broadcasting/film/video  Violence: Not At Risk (09/28/2019)   Humiliation, Afraid, Rape, and Kick questionnaire    Fear of Current or Ex-Partner: No    Emotionally Abused: No    Physically Abused: No    Sexually Abused: No    ROS Review of Systems  Objective:   Today's Vitals: BP 135/88   Pulse 91   Wt 221 lb 1.9 oz (100.3 kg)   SpO2 96%   BMI 39.17 kg/m   Physical Exam   Assessment & Plan:   There are no diagnoses linked to this encounter.   Follow-up: No follow-ups on file.   Gilmore Laroche, FNP

## 2023-02-05 NOTE — Progress Notes (Deleted)
Erroneous encounter disregard the patient left without being seen.

## 2023-02-05 NOTE — Progress Notes (Signed)
Erroneous encounter disregard the patient left without being seen.

## 2023-02-05 NOTE — Progress Notes (Deleted)
New Patient Office Visit  Subjective:  Patient ID: Linda Warner, female    DOB: 1990/08/22  Age: 32 y.o. MRN: [id number]  CC:  Chief Complaint  Patient presents with   New Patient (Initial Visit)    Establishing care. Pt reports headaches on and off. Seasonal allergies and tonsillitis get worse with weather change needs something prescribed to have.     HPI Linda Warner is a 32 y.o. female with past medical history of *** presents for establishing care.  Past Medical History:  Diagnosis Date   Acne    BV (bacterial vaginosis)    Gestational hypertension    Miscarriage    Post partum depression 01/30/2013   Pregnant    Scoliosis    Scoliosis     Past Surgical History:  Procedure Laterality Date   NO PAST SURGERIES      Family History  Problem Relation Age of Onset   Depression Father    Depression Maternal Grandmother    Diabetes Maternal Grandmother    Hyperlipidemia Maternal Grandmother    Hypertension Maternal Grandmother    Cancer Maternal Grandmother        breast   Sickle cell anemia Son    Anesthesia problems Neg Hx     Social History   Socioeconomic History   Marital status: Married    Spouse name: Airyanna Defries   Number of children: 2   Years of education: Not on file   Highest education level: High school graduate  Occupational History   Not on file  Tobacco Use   Smoking status: Some Days    Current packs/day: 0.25    Types: Cigarettes   Smokeless tobacco: Never  Vaping Use   Vaping status: Never Used  Substance and Sexual Activity   Alcohol use: Yes    Comment: ocassional   Drug use: No   Sexual activity: Yes    Birth control/protection: None  Other Topics Concern   Not on file  Social History Narrative   ** Merged History Encounter **       Social Determinants of Health   Financial Resource Strain: Low Risk  (09/28/2019)   Overall Financial Resource Strain (CARDIA)    Difficulty of Paying Living Expenses: Not very hard  Food  Insecurity: No Food Insecurity (09/28/2019)   Hunger Vital Sign    Worried About Running Out of Food in the Last Year: Never true    Ran Out of Food in the Last Year: Never true  Transportation Needs: No Transportation Needs (09/28/2019)   PRAPARE - Administrator, Civil Service (Medical): No    Lack of Transportation (Non-Medical): No  Physical Activity: Inactive (09/28/2019)   Exercise Vital Sign    Days of Exercise per Week: 0 days    Minutes of Exercise per Session: 0 min  Stress: No Stress Concern Present (09/28/2019)   [employer] of Occupational Health - Occupational Stress Questionnaire    Feeling of Stress : Only a little  Social Connections: Moderately Isolated (09/28/2019)   Social Connection and Isolation Panel [NHANES]    Frequency of Communication with Friends and Family: More than three times a week    Frequency of Social Gatherings with Friends and Family: Once a week    Attends Religious Services: 1 to 4 times per year    Active Member of [employer] or Organizations: No    Attends Banker Meetings: Never    Marital Status: Separated  Intimate Programme researcher, broadcasting/film/video  Violence: Not At Risk (09/28/2019)   Humiliation, Afraid, Rape, and Kick questionnaire    Fear of Current or Ex-Partner: No    Emotionally Abused: No    Physically Abused: No    Sexually Abused: No    ROS Review of Systems  Objective:   Today's Vitals: BP 135/88   Pulse 91   Wt 221 lb 1.9 oz (100.3 kg)   SpO2 96%   BMI 39.17 kg/m   Physical Exam   Assessment & Plan:   Erroneous encounter - disregard  IFG (impaired fasting glucose) -     Hemoglobin A1c  Vitamin D deficiency -     VITAMIN D 25 Hydroxy (Vit-D Deficiency, Fractures)  Need for hepatitis C screening test -     Hepatitis C antibody  Encounter for screening for HIV -     HIV Antibody (routine testing w rflx)  Other specified hypothyroidism -     TSH + free T4  Other hyperlipidemia -     Lipid panel -      CMP14+EGFR -     CBC with Differential/Platelet     Follow-up: No follow-ups on file.   Linda Laroche, FNP

## 2023-03-10 NOTE — Progress Notes (Unsigned)
   New Patient Office Visit   Subjective   Patient ID: Linda Warner, female    DOB: 01-May-1991  Age: 32 y.o. MRN: [id number]  CC: No chief complaint on file.   HPI DARRIELL DEMORY 32 year old female, presents to establish care. 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  HPI    Outpatient Encounter Medications as of 03/11/2023  Medication Sig   BORIC ACID VAGINAL VA Place vaginally.   ibuprofen (ADVIL) 400 MG tablet Take 400 mg by mouth every 6 (six) hours as needed.   No facility-administered encounter medications on file as of 03/11/2023.    Past Surgical History:  Procedure Laterality Date   NO PAST SURGERIES      ROS    Objective    There were no vitals taken for this visit.  Physical Exam    Assessment & Plan:  There are no diagnoses linked to this encounter.  No follow-ups on file.   Cruzita Lederer Newman Nip, FNP

## 2023-03-10 NOTE — Patient Instructions (Signed)

## 2023-03-11 ENCOUNTER — Ambulatory Visit: Payer: Medicaid Other | Admitting: Family Medicine

## 2023-03-11 ENCOUNTER — Encounter: Payer: Self-pay | Admitting: Family Medicine

## 2023-03-11 VITALS — BP 149/94 | HR 91 | Ht 63.0 in | Wt 224.0 lb

## 2023-03-11 DIAGNOSIS — E538 Deficiency of other specified B group vitamins: Secondary | ICD-10-CM

## 2023-03-11 DIAGNOSIS — Z136 Encounter for screening for cardiovascular disorders: Secondary | ICD-10-CM

## 2023-03-11 DIAGNOSIS — Z1159 Encounter for screening for other viral diseases: Secondary | ICD-10-CM

## 2023-03-11 DIAGNOSIS — E559 Vitamin D deficiency, unspecified: Secondary | ICD-10-CM | POA: Diagnosis not present

## 2023-03-11 DIAGNOSIS — R7301 Impaired fasting glucose: Secondary | ICD-10-CM | POA: Diagnosis not present

## 2023-03-11 DIAGNOSIS — R030 Elevated blood-pressure reading, without diagnosis of hypertension: Secondary | ICD-10-CM | POA: Diagnosis not present

## 2023-03-11 DIAGNOSIS — E66812 Obesity, class 2: Secondary | ICD-10-CM | POA: Diagnosis not present

## 2023-03-11 DIAGNOSIS — E038 Other specified hypothyroidism: Secondary | ICD-10-CM | POA: Diagnosis not present

## 2023-03-11 DIAGNOSIS — I10 Essential (primary) hypertension: Secondary | ICD-10-CM | POA: Insufficient documentation

## 2023-03-11 NOTE — Assessment & Plan Note (Signed)
Vitals:   03/11/23 1051 03/11/23 1113  BP: (!) 155/100 (!) 149/94  Not controlled currently no medication intervention Follow up in 1 weeks with at home blood pressure readings To monitor trends Labs ordered in today's visit Continued discussion on DASH diet, low sodium diet and maintain a exercise routine for 150 minutes per week.

## 2023-03-11 NOTE — Assessment & Plan Note (Signed)
Started patient on weight management plan Discussed the importance to start eating 3 meals a day including breakfast, drink 8 glasses of water a day ,reduce portion sizes. reduced carbohydrates limit saturated and trans fat, increase servings of vegetables and limit processed foods. Find an activity that you will enjoy and start to be active at least 5 days a week for 30 minutes each day. Keep a food journal or an activity journal to identify triggers that lead to emotional eating Follow up in 4 weeks for Weight loss management

## 2023-03-12 DIAGNOSIS — E538 Deficiency of other specified B group vitamins: Secondary | ICD-10-CM | POA: Diagnosis not present

## 2023-03-12 DIAGNOSIS — R7301 Impaired fasting glucose: Secondary | ICD-10-CM | POA: Diagnosis not present

## 2023-03-12 DIAGNOSIS — E559 Vitamin D deficiency, unspecified: Secondary | ICD-10-CM | POA: Diagnosis not present

## 2023-03-12 DIAGNOSIS — E038 Other specified hypothyroidism: Secondary | ICD-10-CM | POA: Diagnosis not present

## 2023-03-12 DIAGNOSIS — Z136 Encounter for screening for cardiovascular disorders: Secondary | ICD-10-CM | POA: Diagnosis not present

## 2023-03-12 DIAGNOSIS — Z1159 Encounter for screening for other viral diseases: Secondary | ICD-10-CM | POA: Diagnosis not present

## 2023-03-13 LAB — CMP14+EGFR
ALT: 13 [IU]/L (ref 0–32)
AST: 14 [IU]/L (ref 0–40)
Albumin: 4.4 g/dL (ref 3.9–4.9)
Alkaline Phosphatase: 74 [IU]/L (ref 44–121)
BUN/Creatinine Ratio: 19 (ref 9–23)
BUN: 13 mg/dL (ref 6–20)
Bilirubin Total: 0.3 mg/dL (ref 0.0–1.2)
CO2: 24 mmol/L (ref 20–29)
Calcium: 9.3 mg/dL (ref 8.7–10.2)
Chloride: 104 mmol/L (ref 96–106)
Creatinine, Ser: 0.69 mg/dL (ref 0.57–1.00)
Globulin, Total: 2.9 g/dL (ref 1.5–4.5)
Glucose: 85 mg/dL (ref 70–99)
Potassium: 4.1 mmol/L (ref 3.5–5.2)
Sodium: 139 mmol/L (ref 134–144)
Total Protein: 7.3 g/dL (ref 6.0–8.5)
eGFR: 119 mL/min/{1.73_m2} (ref >59)

## 2023-03-13 LAB — HEMOGLOBIN A1C
Est. average glucose Bld gHb Est-mCnc: 105 mg/dL
Hgb A1c MFr Bld: 5.3 % (ref 4.8–5.6)

## 2023-03-13 LAB — TSH+FREE T4
Free T4: 1.35 ng/dL (ref 0.82–1.77)
TSH: 1.96 u[IU]/mL (ref 0.450–4.500)

## 2023-03-13 LAB — CBC WITH DIFFERENTIAL/PLATELET
Basophils Absolute: 0 10*3/uL (ref 0.0–0.2)
Basos: 1 %
EOS (ABSOLUTE): 0.2 10*3/uL (ref 0.0–0.4)
Eos: 4 %
Hematocrit: 39.4 % (ref 34.0–46.6)
Hemoglobin: 12.4 g/dL (ref 11.1–15.9)
Immature Grans (Abs): 0 10*3/uL (ref 0.0–0.1)
Immature Granulocytes: 0 %
Lymphocytes Absolute: 2.2 10*3/uL (ref 0.7–3.1)
Lymphs: 36 %
MCH: 27.3 pg (ref 26.6–33.0)
MCHC: 31.5 g/dL (ref 31.5–35.7)
MCV: 87 fL (ref 79–97)
Monocytes Absolute: 0.3 10*3/uL (ref 0.1–0.9)
Monocytes: 4 %
Neutrophils Absolute: 3.4 10*3/uL (ref 1.4–7.0)
Neutrophils: 55 %
Platelets: 351 10*3/uL (ref 150–450)
RBC: 4.54 x10E6/uL (ref 3.77–5.28)
RDW: 13.9 % (ref 11.7–15.4)
WBC: 6.1 10*3/uL (ref 3.4–10.8)

## 2023-03-13 LAB — HEPATITIS C ANTIBODY: Hep C Virus Ab: NONREACTIVE

## 2023-03-13 LAB — LIPID PANEL
Chol/HDL Ratio: 3.3 ratio (ref 0.0–4.4)
Cholesterol, Total: 157 mg/dL (ref 100–199)
HDL: 48 mg/dL (ref >39)
LDL Chol Calc (NIH): 99 mg/dL (ref 0–99)
Triglycerides: 50 mg/dL (ref 0–149)
VLDL Cholesterol Cal: 10 mg/dL (ref 5–40)

## 2023-03-13 LAB — VITAMIN D 25 HYDROXY (VIT D DEFICIENCY, FRACTURES): Vit D, 25-Hydroxy: 12.5 ng/mL — ABNORMAL LOW (ref 30.0–100.0)

## 2023-03-13 LAB — VITAMIN B12: Vitamin B-12: 328 pg/mL (ref 232–1245)

## 2023-03-16 NOTE — Patient Instructions (Signed)

## 2023-03-16 NOTE — Progress Notes (Unsigned)
   Established Patient Office Visit   Subjective  Patient ID: Linda Warner, female    DOB: 03/08/1991  Age: 32 y.o. MRN: [id number]  No chief complaint on file.   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  HPI  Patient presents to the clinic for    ROS    Objective:     There were no vitals taken for this visit. {Vitals History (Optional):23777}  Physical Exam   No results found for any visits on 03/17/23.  The ASCVD Risk score (Arnett DK, et al., 2019) failed to calculate for the following reasons:   The 2019 ASCVD risk score is only valid for ages 23 to 22    Assessment & Plan:  There are no diagnoses linked to this encounter.  No follow-ups on file.   Cruzita Lederer Newman Nip, FNP

## 2023-03-17 ENCOUNTER — Other Ambulatory Visit (HOSPITAL_COMMUNITY)
Admission: RE | Admit: 2023-03-17 | Discharge: 2023-03-17 | Disposition: A | Discharge status: Home / Self Care | Payer: Medicaid Other | Source: Ambulatory Visit | Attending: Family Medicine | Admitting: Family Medicine

## 2023-03-17 ENCOUNTER — Encounter: Payer: Self-pay | Admitting: Family Medicine

## 2023-03-17 ENCOUNTER — Ambulatory Visit: Payer: Medicaid Other | Admitting: Family Medicine

## 2023-03-17 VITALS — BP 137/87 | HR 76 | Ht 63.0 in | Wt 224.0 lb

## 2023-03-17 DIAGNOSIS — Z124 Encounter for screening for malignant neoplasm of cervix: Secondary | ICD-10-CM | POA: Insufficient documentation

## 2023-03-17 DIAGNOSIS — I10 Essential (primary) hypertension: Secondary | ICD-10-CM | POA: Diagnosis not present

## 2023-03-17 MED ORDER — TRIAMCINOLONE ACETONIDE 0.5 % EX OINT: 1.0000 | TOPICAL_OINTMENT | Freq: Two times a day (BID) | CUTANEOUS | 0 refills | Status: DC

## 2023-03-17 MED ORDER — AMLODIPINE BESYLATE 5 MG PO TABS
5.0000 mg | ORAL_TABLET | Freq: Every day | ORAL | 2 refills | Status: DC
Start: 2023-03-17 — End: 2023-04-08

## 2023-03-17 NOTE — Assessment & Plan Note (Signed)
Pap smear done, Patient tolerated procedure well. Informed patient I will keep them updated on results. Right breasts appear normal, no suspicious masses, no skin or nipple changes or axillary nodes, right breast normal without mass, skin or nipple changes or axillary nodes, left breast normal without mass, skin or nipple changes or axillary nodes.

## 2023-03-17 NOTE — Assessment & Plan Note (Addendum)
Vitals:   03/17/23 1035  BP: 137/87  At home blood pressure readings ranging from 150's/100's Started patient on Amlodipine 5 mg once daily Follow up in 4 weeks with at home blood pressure reading to monitor trends  Discussed with  patient to monitor their blood pressure regularly and maintain a heart-healthy diet rich in fruits, vegetables, whole grains, and low-fat dairy, while reducing sodium intake to less than 2,300 mg per day. Regular physical activity, such as 30 minutes of moderate exercise most days of the week, will help lower blood pressure and improve overall cardiovascular health. Avoiding smoking, limiting alcohol consumption, and managing stress. Take  prescribed medication, & take it as directed and avoid skipping doses. Seek emergency care if your blood pressure is (over 180/100) or you experience chest pain, shortness of breath, or sudden vision changes.Patient verbalizes understanding regarding plan of care and all questions answered.

## 2023-03-22 LAB — CYTOLOGY - PAP
Chlamydia: NEGATIVE
Comment: NEGATIVE
Comment: NEGATIVE
Comment: NEGATIVE
Comment: NORMAL
Diagnosis: NEGATIVE
HSV1: NEGATIVE
HSV2: NEGATIVE
Neisseria Gonorrhea: NEGATIVE
Trichomonas: NEGATIVE

## 2023-04-07 NOTE — Progress Notes (Unsigned)
   Established Patient Office Visit   Subjective  Patient ID: Linda Warner, female    DOB: 1991-01-10  Age: 32 y.o. MRN: [id number]  No chief complaint on file.   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  HPI  ROS    Objective:     There were no vitals taken for this visit. {Vitals History (Optional):23777}  Physical Exam   No results found for any visits on 04/08/23.  The ASCVD Risk score (Arnett DK, et al., 2019) failed to calculate for the following reasons:   The 2019 ASCVD risk score is only valid for ages 68 to 62    Assessment & Plan:  There are no diagnoses linked to this encounter.  No follow-ups on file.   Cruzita Lederer Newman Nip, FNP

## 2023-04-07 NOTE — Patient Instructions (Signed)
        Great to see you today.  I have refilled the medication(s) we provide.   Follow up in 4 weeks via my chart with  at home blood pressures readings    If labs were collected, we will inform you of lab results once received either by echart message or telephone call.   - echart message- for normal results that have been seen by the patient already.   - telephone call: abnormal results or if patient has not viewed results in their echart.   - Please take medications as prescribed. - Follow up with your primary health provider if any health concerns arises. - If symptoms worsen please contact your primary care provider and/or visit the emergency department.

## 2023-04-08 ENCOUNTER — Ambulatory Visit
Admission: EM | Admit: 2023-04-08 | Discharge: 2023-04-08 | Disposition: A | Discharge status: Home / Self Care | Payer: Medicaid Other | Attending: Family Medicine | Admitting: Family Medicine

## 2023-04-08 ENCOUNTER — Encounter: Payer: Self-pay | Admitting: Family Medicine

## 2023-04-08 ENCOUNTER — Ambulatory Visit: Payer: Self-pay | Admitting: Family Medicine

## 2023-04-08 ENCOUNTER — Encounter: Payer: Self-pay | Admitting: Emergency Medicine

## 2023-04-08 VITALS — BP 133/83 | HR 85 | Ht 63.0 in | Wt 224.0 lb

## 2023-04-08 DIAGNOSIS — I10 Essential (primary) hypertension: Secondary | ICD-10-CM | POA: Diagnosis not present

## 2023-04-08 DIAGNOSIS — L509 Urticaria, unspecified: Secondary | ICD-10-CM | POA: Diagnosis not present

## 2023-04-08 DIAGNOSIS — E66812 Obesity, class 2: Secondary | ICD-10-CM | POA: Diagnosis not present

## 2023-04-08 MED ORDER — AMLODIPINE BESYLATE 10 MG PO TABS: 10.0000 mg | ORAL_TABLET | Freq: Every day | ORAL | 3 refills | Status: DC

## 2023-04-08 MED ORDER — SEMAGLUTIDE-WEIGHT MANAGEMENT 0.25 MG/0.5ML ~~LOC~~ SOAJ: 0.2500 mg | SUBCUTANEOUS | 0 refills | Status: DC

## 2023-04-08 MED ORDER — METHYLPREDNISOLONE SODIUM SUCC 125 MG IJ SOLR
80.0000 mg | Freq: Once | INTRAMUSCULAR | Status: AC
  Administered 2023-04-08: 80 mg via INTRAMUSCULAR

## 2023-04-08 MED ORDER — CETIRIZINE HCL 10 MG PO TABS: 10.0000 mg | ORAL_TABLET | Freq: Every day | ORAL | 0 refills | Status: DC

## 2023-04-08 NOTE — ED Provider Notes (Signed)
[facility]    CSN: [id number] Arrival date & time: 04/08/23  1403      History   Chief Complaint No chief complaint on file.   HPI Linda Warner is a 32 y.o. female.   Patient presenting today with itchy rash across body, worse on scalp, shoulders and arms.  Denies throat itching or swelling, chest tightness, shortness of breath, abdominal pain, nausea vomiting or diarrhea.  So far trying anything over-the-counter for symptoms.  States she just started using a new hair oil.    Past Medical History:  Diagnosis Date   Acne    BV (bacterial vaginosis)    Gestational hypertension    Miscarriage    Post partum depression 01/30/2013   Pregnant    Scoliosis    Scoliosis     Patient Active Problem List   Diagnosis Date Noted   Cervical cancer screening 03/17/2023   Hypertension 03/11/2023   Obesity, Class II, BMI 35-39.9 03/11/2023   History of preterm delivery 09/28/2019   History of gestational hypertension 09/04/2019   Abnormal chromosomal and genetic finding on antenatal screening of mother 04/03/2019   Family history of sickle cell disease 03/20/2019   Postpartum depression 01/30/2013   SCOLIOSIS 07/25/2008   MOOD SWINGS 10/14/2006    Past Surgical History:  Procedure Laterality Date   NO PAST SURGERIES      OB History     Gravida  6   Para  4   Term  3   Preterm  1   AB  1   Living  2      SAB  1   IAB  0   Ectopic  0   Multiple      Live Births  2            Home Medications    Prior to Admission medications   Medication Sig Start Date End Date Taking? Authorizing Provider  cetirizine (ZYRTEC ALLERGY) 10 MG tablet Take 1 tablet (10 mg total) by mouth daily. 04/08/23  Yes Particia Nearing, PA-C  amLODipine (NORVASC) 10 MG tablet Take 1 tablet (10 mg total) by mouth daily. 04/08/23   Del Nigel Berthold, FNP  BORIC ACID VAGINAL [facility] vaginally.    [provider]  ibuprofen (ADVIL) 400 MG  tablet Take 400 mg by mouth every 6 (six) hours as needed.    [provider]  Semaglutide-Weight Management 0.25 MG/0.5ML SOAJ Inject 0.25 mg into the skin once a week for 28 days. 04/08/23 05/06/23  Del Nigel Berthold, FNP    Family History Family History  Problem Relation Age of Onset   Depression Father    Sickle cell anemia Son    ADD / ADHD Son    Depression Maternal Grandmother    Diabetes Maternal Grandmother    Hyperlipidemia Maternal Grandmother    Hypertension Maternal Grandmother    Cancer Maternal Grandmother        breast   Anesthesia problems Neg Hx     Social History Social History   Tobacco Use   Smoking status: Former    Current packs/day: 0.25    Average packs/day: 0.3 packs/day for 0.9 years (0.2 ttl pk-yrs)    Types: Cigarettes    Start date: 2024   Smokeless tobacco: Never  Vaping Use   Vaping status: Never Used  Substance Use Topics   Alcohol use: Yes    Comment: ocassional   Drug use: No  Allergies   Patient has no known allergies.   Review of Systems Review of Systems Per HPI  Physical Exam Triage Vital Signs ED Triage Vitals  Encounter Vitals Group     BP 04/08/23 1408 128/84     Systolic BP Percentile --      Diastolic BP Percentile --      Pulse Rate 04/08/23 1408 (!) 103     Resp 04/08/23 1408 18     Temp 04/08/23 1408 97.7 F (36.5 C)     Temp Source 04/08/23 1408 Oral     SpO2 04/08/23 1408 96 %     Weight --      Height --      Head Circumference --      Peak Flow --      Pain Score 04/08/23 1409 0     Pain Loc --      Pain Education --      Exclude from Growth Chart --    No data found.  Updated Vital Signs BP 128/84 (BP Location: Right Arm)   Pulse (!) 103   Temp 97.7 F (36.5 C) (Oral)   Resp 18   LMP 03/09/2023 (Approximate)   SpO2 96%   Visual Acuity Right Eye Distance:   Left Eye Distance:   Bilateral Distance:    Right Eye Near:   Left Eye Near:    Bilateral Near:      Physical Exam Vitals and nursing note reviewed.  Constitutional:      Appearance: Normal appearance. She is not ill-appearing.  HENT:     Head: Atraumatic.     Mouth/Throat:     Mouth: Mucous membranes are moist.     Pharynx: Oropharynx is clear.  Eyes:     Extraocular Movements: Extraocular movements intact.     Conjunctiva/sclera: Conjunctivae normal.  Cardiovascular:     Rate and Rhythm: Normal rate and regular rhythm.     Heart sounds: Normal heart sounds.  Pulmonary:     Effort: Pulmonary effort is normal.     Breath sounds: Normal breath sounds. No wheezing or rales.  Musculoskeletal:        General: Normal range of motion.     Cervical back: Normal range of motion and neck supple.  Skin:    General: Skin is warm and dry.     Findings: Rash present.     Comments: Urticarial erythematous rash to bilateral arms, chest, face and scalp  Neurological:     Mental Status: She is alert and oriented to person, place, and time.  Psychiatric:        Mood and Affect: Mood normal.        Thought Content: Thought content normal.        Judgment: Judgment normal.      UC Treatments / Results  Labs (all labs ordered are listed, but only abnormal results are displayed) Labs Reviewed - No data to display  EKG   Radiology No results found.  Procedures Procedures (including critical care time)  Medications Ordered in UC Medications  methylPREDNISolone sodium succinate (SOLU-MEDROL) 125 mg/2 mL injection 80 mg (80 mg Intramuscular Given 04/08/23 1459)    Initial Impression / Assessment and Plan / UC Course  I have reviewed the triage vital signs and the nursing notes.  Pertinent labs & imaging results that were available during my care of the patient were reviewed by me and considered in my medical decision making (see chart for details).  Consistent with hives, possibly from new scalp oil.  Discontinue, only use hypoallergenic unscented products, antihistamines,  IM Solu-Medrol given in clinic.  Discussed supportive home care and return precautions.  Final Clinical Impressions(s) / UC Diagnoses   Final diagnoses:  Hives     Discharge Instructions      We have given you a steroid shot today to help with your hives rash.  Avoid any new products as these might be what are causing the reaction.  I have also sent in Zyrtec that you can take once to twice daily until rash resolves.  Follow-up for worsening symptoms.    ED Prescriptions     Medication Sig Dispense Auth. Provider   cetirizine (ZYRTEC ALLERGY) 10 MG tablet Take 1 tablet (10 mg total) by mouth daily. 30 tablet Particia Nearing, [place]      PDMP not reviewed this encounter.   Particia Nearing, [place] 04/08/23 1758

## 2023-04-08 NOTE — ED Triage Notes (Signed)
Itchy rash all over body that started today.

## 2023-04-08 NOTE — Assessment & Plan Note (Signed)
Trial on Wegovy 0.25 mg weekly injection Continued discussion adhering to weight loss plan, with strong emphasize on nutrition and exercise. Read food labels to know how many calories are in each serving , Increase water intake 2-3 L a day. Include more protein intake such as lean meat, poultry, fish. Increase fiber intake such as vegetables, whole grains, fruits, artichokes, green peas, broccoli, lentils and lima beans.

## 2023-04-08 NOTE — Discharge Instructions (Signed)
We have given you a steroid shot today to help with your hives rash.  Avoid any new products as these might be what are causing the reaction.  I have also sent in Zyrtec that you can take once to twice daily until rash resolves.  Follow-up for worsening symptoms.

## 2023-04-08 NOTE — Assessment & Plan Note (Signed)
At home blood pressures not controlled, ranging from 140-150s-90s/100s Increased Amlodipine 10 mg once daily Follow up in 4 weeks with at home blood pressure reading to monitor trends via my chart. Continued discussion on DASH diet, low sodium diet and maintain a exercise routine for 150 minutes per week.

## 2023-04-30 ENCOUNTER — Encounter: Payer: Self-pay | Admitting: Family Medicine

## 2023-05-03 NOTE — Telephone Encounter (Signed)
Office visit to rule out migraines

## 2023-05-03 NOTE — Telephone Encounter (Signed)
PA not found. Entered one today.

## 2023-05-03 NOTE — Telephone Encounter (Signed)
Patient is needing to be seen sooner then what is available in epic.

## 2023-05-03 NOTE — Telephone Encounter (Signed)
Blood pressure has improved continue amlodipine 10 mg once daily Thanks for the PA

## 2023-05-04 ENCOUNTER — Telehealth: Payer: Self-pay

## 2023-05-04 NOTE — Telephone Encounter (Signed)
Provider notified of mediation approval

## 2023-05-04 NOTE — Telephone Encounter (Signed)
Pt made appt via mychart

## 2023-05-05 ENCOUNTER — Other Ambulatory Visit: Payer: Self-pay | Admitting: Family Medicine

## 2023-05-05 NOTE — Patient Instructions (Addendum)
        Great to see you today.  I have refilled the medication(s) we provide.    - Please take medications as prescribed. - Follow up with your primary health provider if any health concerns arises. - If symptoms worsen please contact your primary care provider and/or visit the emergency department.  

## 2023-05-05 NOTE — Progress Notes (Unsigned)
   Established Patient Office Visit   Subjective  Patient ID: Linda Warner, female    DOB: 21-Sep-1990  Age: 32 y.o. MRN: [id number]  No chief complaint on file.   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  Migraine     ROS    Objective:     LMP 03/09/2023 (Approximate)  {Vitals History (Optional):23777}  Physical Exam   No results found for any visits on 05/06/23.  The ASCVD Risk score (Arnett DK, et al., 2019) failed to calculate for the following reasons:   The 2019 ASCVD risk score is only valid for ages 47 to 16    Assessment & Plan:  There are no diagnoses linked to this encounter.  No follow-ups on file.   Cruzita Lederer Newman Nip, FNP

## 2023-05-06 ENCOUNTER — Ambulatory Visit: Payer: Medicaid Other | Admitting: Family Medicine

## 2023-05-06 ENCOUNTER — Encounter: Payer: Self-pay | Admitting: Family Medicine

## 2023-05-06 VITALS — BP 109/77 | HR 95 | Ht 63.0 in | Wt 228.0 lb

## 2023-05-06 DIAGNOSIS — I10 Essential (primary) hypertension: Secondary | ICD-10-CM

## 2023-05-06 DIAGNOSIS — G43909 Migraine, unspecified, not intractable, without status migrainosus: Secondary | ICD-10-CM | POA: Insufficient documentation

## 2023-05-06 DIAGNOSIS — G43009 Migraine without aura, not intractable, without status migrainosus: Secondary | ICD-10-CM

## 2023-05-06 MED ORDER — PROPRANOLOL HCL 10 MG PO TABS: 10.0000 mg | ORAL_TABLET | Freq: Two times a day (BID) | ORAL | 2 refills | Status: DC | PRN

## 2023-05-06 MED ORDER — OLMESARTAN MEDOXOMIL 20 MG PO TABS: 10.0000 mg | ORAL_TABLET | Freq: Every day | ORAL | 2 refills | Status: DC

## 2023-05-06 NOTE — Assessment & Plan Note (Signed)
Vitals:   05/06/23 1125  BP: 109/77  Discontinue Amlodipine 10 mg due to side effects; hives Start Olmesartan 10 mg once daily Follow up in 4 weeks with at home blood pressure readings to monitor trends

## 2023-05-06 NOTE — Assessment & Plan Note (Signed)
 Trial on Propranolol 10 mg PRN Advise methods include deep breathing, cognitive behavioral therapy focusing on changing thoughts, Limit or avoid alcohol, caffeine, chocolate, canner foods, MSG and aspartame.  Implement sleep hygiene includes not watching TV or listen music in bed, don't eat heavy meals within a couple of hours of bedtime, don't use your phone, laptop, or tablet at bedtime

## 2023-05-20 ENCOUNTER — Encounter: Payer: Self-pay | Admitting: Family Medicine

## 2023-05-21 ENCOUNTER — Other Ambulatory Visit: Payer: Self-pay | Admitting: Family Medicine

## 2023-05-21 MED ORDER — TOPIRAMATE 25 MG PO TABS: 25.0000 mg | ORAL_TABLET | Freq: Every day | ORAL | 2 refills | Status: DC

## 2023-05-21 NOTE — Telephone Encounter (Signed)
 Please inform the patient that a prescription for Topamax  25 mg has been sent to their pharmacy. A referral to neurology has been placed; however, it may take some time to schedule the appointment. In the meantime, the patient should take Topamax  25 mg daily as prescribed. Advise them to visit the emergency department if their symptoms worsen or become unmanageable

## 2023-05-22 ENCOUNTER — Other Ambulatory Visit: Payer: Self-pay

## 2023-05-22 ENCOUNTER — Emergency Department (HOSPITAL_COMMUNITY): Payer: Medicaid Other

## 2023-05-22 ENCOUNTER — Emergency Department (HOSPITAL_COMMUNITY)
Admission: EM | Admit: 2023-05-22 | Discharge: 2023-05-22 | Disposition: A | Discharge status: Home / Self Care | Payer: Medicaid Other | Attending: Emergency Medicine | Admitting: Emergency Medicine

## 2023-05-22 ENCOUNTER — Encounter (HOSPITAL_COMMUNITY): Payer: Self-pay | Admitting: *Deleted

## 2023-05-22 DIAGNOSIS — Z79899 Other long term (current) drug therapy: Secondary | ICD-10-CM | POA: Insufficient documentation

## 2023-05-22 DIAGNOSIS — I10 Essential (primary) hypertension: Secondary | ICD-10-CM | POA: Diagnosis not present

## 2023-05-22 DIAGNOSIS — R519 Headache, unspecified: Secondary | ICD-10-CM | POA: Diagnosis not present

## 2023-05-22 LAB — CBC
HCT: 36 % (ref 36.0–46.0)
Hemoglobin: 12.5 g/dL (ref 12.0–15.0)
MCH: 28.3 pg (ref 26.0–34.0)
MCHC: 34.7 g/dL (ref 30.0–36.0)
MCV: 81.4 fL (ref 80.0–100.0)
Platelets: 306 10*3/uL (ref 150–400)
RBC: 4.42 MIL/uL (ref 3.87–5.11)
RDW: 13.8 % (ref 11.5–15.5)
WBC: 5.3 10*3/uL (ref 4.0–10.5)
nRBC: 0 % (ref 0.0–0.2)

## 2023-05-22 LAB — BASIC METABOLIC PANEL
Anion gap: 6 (ref 5–15)
BUN: 13 mg/dL (ref 6–20)
CO2: 25 mmol/L (ref 22–32)
Calcium: 9.5 mg/dL (ref 8.9–10.3)
Chloride: 105 mmol/L (ref 98–111)
Creatinine, Ser: 0.71 mg/dL (ref 0.44–1.00)
GFR, Estimated: >60 mL/min (ref >60)
Glucose, Bld: 94 mg/dL (ref 70–99)
Potassium: 3.8 mmol/L (ref 3.5–5.1)
Sodium: 136 mmol/L (ref 135–145)

## 2023-05-22 MED ORDER — KETOROLAC TROMETHAMINE 30 MG/ML IJ SOLN
30.0000 mg | Freq: Once | INTRAMUSCULAR | Status: AC
  Administered 2023-05-22: 30 mg via INTRAVENOUS
  Filled 2023-05-22: qty 1

## 2023-05-22 MED ORDER — PROCHLORPERAZINE EDISYLATE 10 MG/2ML IJ SOLN
10.0000 mg | Freq: Once | INTRAMUSCULAR | Status: AC
  Administered 2023-05-22: 10 mg via INTRAVENOUS
  Filled 2023-05-22: qty 2

## 2023-05-22 NOTE — Discharge Instructions (Addendum)
 Your laboratory tests and CT scan today were normal.  Follow-up with your doctor and the neurologist as planned

## 2023-05-22 NOTE — ED Triage Notes (Signed)
 Pt c/o recurrent headaches. She called her PCP but was told to to come to the ED because she can't be seen by the neurologist anytime soon. Ibuprofen helps to relieve the pain.

## 2023-05-22 NOTE — ED Provider Notes (Signed)
 [place] [facility] AT [facility] Provider Note   CSN: [id number] Arrival date & time: 05/22/23  0848     History  Chief Complaint  Patient presents with   Headache    Linda Warner is a 33 y.o. female.   Headache    Patient has a history of gestational hypertension who presents ED for evaluation of persistent headaches.  Patient states she has been having headaches for several months now.  She has seen her primary care doctor and has been prescribed several medications.  Patient states these headaches unfortunately continue.  She has had headaches almost daily now for the past week.  Patient is now taking propranolol  and was recently sent a prescription for Topamax .  Ibuprofen  often does help the headaches.  Patient was also given a referral to a neurologist.  Patient has not had any imaging.  She states her doctor told her to come to the emergency room to have brain scanning and possibly see a neurologist because of her persistent headaches.  Patient states there is a family history of a cousin having a headache condition associated with fluid on her brain  Home Medications Prior to Admission medications   Medication Sig Start Date End Date Taking? Authorizing Provider  BORIC ACID VAGINAL [facility] vaginally.    [provider]  cetirizine  (ZYRTEC  ALLERGY) 10 MG tablet Take 1 tablet (10 mg total) by mouth daily. 04/08/23   Stuart Vernell Norris, PA-C  ibuprofen  (ADVIL ) 400 MG tablet Take 400 mg by mouth every 6 (six) hours as needed.    [provider]  olmesartan  (BENICAR ) 20 MG tablet Take 0.5 tablets (10 mg total) by mouth daily. 05/06/23   Del Orbe Polanco, Iliana, FNP  propranolol  (INDERAL ) 10 MG tablet Take 1 tablet (10 mg total) by mouth every 12 (twelve) hours as needed. 05/06/23   Del Orbe Polanco, Iliana, FNP  topiramate  (TOPAMAX ) 25 MG tablet Take 1 tablet (25 mg total) by mouth daily. 05/21/23   Del Orbe Polanco, Iliana, FNP       Allergies    Amlodipine     Review of Systems   Review of Systems  Neurological:  Positive for headaches.    Physical Exam Updated Vital Signs BP (!) 152/104 (BP Location: Left Arm)   Pulse 91   Temp 97.7 F (36.5 C) (Oral)   Resp 18   Ht 1.6 m (5' 3)   Wt 102.1 kg   LMP 05/09/2023 (Approximate)   SpO2 98%   BMI 39.86 kg/m  Physical Exam Vitals and nursing note reviewed.  Constitutional:      General: She is not in acute distress.    Appearance: She is well-developed.  HENT:     Head: Normocephalic and atraumatic.     Right Ear: External ear normal.     Left Ear: External ear normal.  Eyes:     General: No visual field deficit or scleral icterus.       Right eye: No discharge.        Left eye: No discharge.     Extraocular Movements: Extraocular movements intact.     Conjunctiva/sclera: Conjunctivae normal.     Pupils: Pupils are equal, round, and reactive to light.     Comments: No papilledema noted on funduscopic exam  Neck:     Trachea: No tracheal deviation.  Cardiovascular:     Rate and Rhythm: Normal rate and regular rhythm.  Pulmonary:     Effort: Pulmonary effort  is normal. No respiratory distress.     Breath sounds: Normal breath sounds. No stridor. No wheezing or rales.  Abdominal:     General: Bowel sounds are normal. There is no distension.     Palpations: Abdomen is soft.     Tenderness: There is no abdominal tenderness. There is no guarding or rebound.  Musculoskeletal:        General: No tenderness.     Cervical back: Neck supple.  Skin:    General: Skin is warm and dry.     Findings: No rash.  Neurological:     Mental Status: She is alert and oriented to person, place, and time.     Cranial Nerves: No cranial nerve deficit, dysarthria or facial asymmetry.     Sensory: No sensory deficit.     Motor: No abnormal muscle tone, seizure activity or pronator drift.     Coordination: Coordination normal.     Comments:  able to hold both legs  off bed for 5 seconds, sensation intact in all extremities,  no left or right sided neglect, normal finger-nose exam bilaterally, no nystagmus noted   Psychiatric:        Mood and Affect: Mood normal.     ED Results / Procedures / Treatments   Labs (all labs ordered are listed, but only abnormal results are displayed) Labs Reviewed  CBC  BASIC METABOLIC PANEL    EKG None  Radiology CT Head Wo Contrast Result Date: 05/22/2023 CLINICAL DATA:  Headache EXAM: CT HEAD WITHOUT CONTRAST TECHNIQUE: Contiguous axial images were obtained from the base of the skull through the vertex without intravenous contrast. RADIATION DOSE REDUCTION: This exam was performed according to the departmental dose-optimization program which includes automated exposure control, adjustment of the mA and/or kV according to patient size and/or use of iterative reconstruction technique. COMPARISON:  None Available. FINDINGS: Brain: No evidence of acute infarction, hemorrhage, hydrocephalus, extra-axial collection or mass lesion/mass effect. Vascular: No hyperdense vessel or unexpected calcification. Skull: Normal. Negative for fracture or focal lesion. Sinuses/Orbits: No acute finding. IMPRESSION: Negative head CT.  No explanation for headache. Electronically Signed   By: Dorn Roulette M.D.   On: 05/22/2023 09:40    Procedures Procedures    Medications Ordered in ED Medications  ketorolac  (TORADOL ) 30 MG/ML injection 30 mg (30 mg Intravenous Given 05/22/23 0945)  prochlorperazine  (COMPAZINE ) injection 10 mg (10 mg Intravenous Given 05/22/23 0946)    ED Course/ Medical Decision Making/ A&P Clinical Course as of 05/22/23 1019  Sat May 22, 2023  1000 CBC and metabolic panel are normal. [JK]  1001 CT scan does not show any acute abnormality [JK]    Clinical Course User Index [JK] Randol Simmonds, MD                                 Medical Decision Making Problems Addressed: Headache disorder: acute illness or injury  that poses a threat to life or bodily functions  Amount and/or Complexity of Data Reviewed Labs: ordered. Radiology: ordered.  Risk Prescription drug management.   Patient presented to the ED for evaluation of persistent headaches.  Patient was sent to the ED to have brain imaging concerning her chronic headaches.  Patient has not had any before.  Patient is waiting to see a neurologist as an outpatient.  There is a family history of possible hydrocephalus as described by the patient.  Fortunately her CT scan  does not show any acute abnormality including hydrocephalus.  I do not see any papilledema on exam to suggest idiopathic intracranial hypertension.    Evaluation and diagnostic testing in the emergency department does not suggest an emergent condition requiring admission or immediate intervention beyond what has been performed at this time.  The patient is safe for discharge and has been instructed to return immediately for worsening symptoms, change in symptoms or any other concerns.         Final Clinical Impression(s) / ED Diagnoses Final diagnoses:  Headache disorder    Rx / DC Orders ED Discharge Orders     None         Randol Simmonds, MD 05/22/23 1019

## 2023-05-26 ENCOUNTER — Encounter: Payer: Self-pay | Admitting: Family Medicine

## 2023-05-26 ENCOUNTER — Other Ambulatory Visit: Payer: Self-pay | Admitting: Family Medicine

## 2023-05-26 DIAGNOSIS — E66812 Obesity, class 2: Secondary | ICD-10-CM

## 2023-05-30 ENCOUNTER — Other Ambulatory Visit: Payer: Self-pay | Admitting: Family Medicine

## 2023-05-30 DIAGNOSIS — E66812 Obesity, class 2: Secondary | ICD-10-CM

## 2023-05-31 DIAGNOSIS — H5213 Myopia, bilateral: Secondary | ICD-10-CM | POA: Diagnosis not present

## 2023-06-01 ENCOUNTER — Other Ambulatory Visit: Payer: Self-pay | Admitting: Family Medicine

## 2023-06-01 MED ORDER — SEMAGLUTIDE-WEIGHT MANAGEMENT 0.5 MG/0.5ML ~~LOC~~ SOAJ: 0.5000 mg | SUBCUTANEOUS | 0 refills | Status: AC

## 2023-06-01 NOTE — Telephone Encounter (Signed)
Sent wegovy 0.5 mg

## 2023-06-30 ENCOUNTER — Other Ambulatory Visit: Payer: Self-pay | Admitting: Family Medicine

## 2023-06-30 MED ORDER — SEMAGLUTIDE-WEIGHT MANAGEMENT 1 MG/0.5ML ~~LOC~~ SOAJ
1.0000 mg | SUBCUTANEOUS | 0 refills | Status: DC
Start: 2023-08-27 — End: 2023-07-27

## 2023-07-16 ENCOUNTER — Other Ambulatory Visit: Payer: Self-pay | Admitting: Family Medicine

## 2023-07-16 NOTE — Telephone Encounter (Signed)
 Last Fill: 06/30/23 2 mL/0 RF  Last OV: 04/08/23 Next OV: 08/06/23  Routing to provider for review/authorization.

## 2023-07-16 NOTE — Telephone Encounter (Signed)
 Copied from CRM [id number]. Topic: Clinical - Medication Refill >> Jul 16, 2023 11:48 AM Eula Fried wrote: Most Recent Primary Care Visit:  Provider: [facility]  Department: [facility]  Visit Type: OFFICE VISIT  Date: 05/06/2023  Medication: [id number]   Has the patient contacted their pharmacy? Yes (Agent: If no, request that the patient contact the pharmacy for the refill. If patient does not wish to contact the pharmacy document the reason why and proceed with request.) (Agent: If yes, when and what did the pharmacy advise?)  Is this the correct pharmacy for this prescription? Yes If no, delete pharmacy and type the correct one.  This is the patient's preferred pharmacy:  [id number] - [place] [place] - [address] AT [facility] [address]  [place] [address] Phone: [phone] Fax: [phone]   Has the prescription been filled recently? No- pharmacy states it is pending   Is the patient out of the medication? No -  will be by next week  Has the patient been seen for an appointment in the last year OR does the patient have an upcoming appointment? Yes  Can we respond through MyChart? Yes  Agent: Please be advised that Rx refills may take up to 3 business days. We ask that you follow-up with your pharmacy.

## 2023-07-16 NOTE — Telephone Encounter (Signed)
 Too soon for refill.

## 2023-07-18 ENCOUNTER — Encounter: Payer: Self-pay | Admitting: Family Medicine

## 2023-07-20 ENCOUNTER — Telehealth: Payer: Self-pay | Admitting: Pharmacy Technician

## 2023-07-20 ENCOUNTER — Other Ambulatory Visit (HOSPITAL_COMMUNITY): Payer: Self-pay

## 2023-07-20 NOTE — Telephone Encounter (Signed)
 PA request has been Started. New Encounter has been or will be created for follow up. For additional info see Pharmacy Prior Auth telephone encounter from 07/20/2023.

## 2023-07-20 NOTE — Telephone Encounter (Signed)
 Pharmacy Patient Advocate Encounter   Received notification from Patient Advice Request messages that prior authorization for [facility] 0.5MG /0.5ML PEN is required/requested.   Insurance verification completed.   The patient is insured through [facility] .   Per test claim: PA required; PA started via CoverMyMeds. KEY [id number] . Waiting for clinical questions to populate.

## 2023-07-21 ENCOUNTER — Telehealth: Payer: Self-pay | Admitting: Family Medicine

## 2023-07-21 ENCOUNTER — Other Ambulatory Visit (HOSPITAL_COMMUNITY): Payer: Self-pay

## 2023-07-21 ENCOUNTER — Other Ambulatory Visit: Payer: Self-pay | Admitting: Family Medicine

## 2023-07-21 ENCOUNTER — Encounter: Payer: Self-pay | Admitting: Family Medicine

## 2023-07-21 MED ORDER — SEMAGLUTIDE-WEIGHT MANAGEMENT 1.7 MG/0.75ML ~~LOC~~ SOAJ: 1.7000 mg | SUBCUTANEOUS | 0 refills | Status: DC

## 2023-07-21 NOTE — Telephone Encounter (Signed)
 Copied from CRM [id number]. Topic: General - Call Back - No Documentation >> Jul 21, 2023 11:35 AM Higinio Roger wrote: Reason for CRM: Patient stated insurance company [employer] is requesting a callback to discuss prior authorization for Semaglutide-Weight Management 1 MG/0.5ML SOAJ (Starting on 08/27/2023). Patient could not locate insurance callback #. Patient callback #: [phone]

## 2023-07-21 NOTE — Telephone Encounter (Signed)
 Pharmacy Patient Advocate Encounter   Received notification from Patient Advice Request messages that prior authorization for [facility] 0.5MG /0.5ML PEN is required/requested.   Insurance verification completed.   The patient is insured through [facility] .   Per test claim: PA required; PA submitted to above mentioned insurance via CoverMyMeds Key/confirmation #/EOC [facility] Status is pending

## 2023-07-21 NOTE — Telephone Encounter (Signed)
 Sent!

## 2023-07-22 NOTE — Telephone Encounter (Signed)
 Thank you :)

## 2023-07-22 NOTE — Telephone Encounter (Signed)
 PA request has been Started. New Encounter has been or will be created for follow up. For additional info see Pharmacy Prior Auth telephone encounter from 07/21/2023.

## 2023-07-23 ENCOUNTER — Other Ambulatory Visit (HOSPITAL_COMMUNITY): Payer: Self-pay

## 2023-07-23 NOTE — Telephone Encounter (Signed)
 PA request has been Denied. New Encounter has been or will be created for follow up. For additional info see Pharmacy Prior Auth telephone encounter from 07/20/2023.

## 2023-07-23 NOTE — Telephone Encounter (Signed)
 Pharmacy Patient Advocate Encounter  Received notification from [facility] that Prior Authorization for [facility] 0.5MG /0.5ML PEN has been DENIED.  Full denial letter will be uploaded to the media tab. See denial reason below.   PA #/Case ID/Reference #: Key: [id number]

## 2023-07-23 NOTE — Telephone Encounter (Signed)
 Patient is calling to check on PA status for Semaglutide-Weight Management 1.7 MG/0.75ML SOAJ [[id number]] Patient is requesting a call back [phone]

## 2023-07-26 ENCOUNTER — Other Ambulatory Visit (HOSPITAL_COMMUNITY): Payer: Self-pay

## 2023-07-27 ENCOUNTER — Other Ambulatory Visit: Payer: Self-pay | Admitting: Family Medicine

## 2023-07-28 ENCOUNTER — Other Ambulatory Visit (HOSPITAL_COMMUNITY): Payer: Self-pay

## 2023-08-06 ENCOUNTER — Ambulatory Visit: Payer: Medicaid Other | Admitting: Family Medicine

## 2023-08-11 ENCOUNTER — Encounter: Payer: Self-pay | Admitting: Family Medicine

## 2023-08-19 ENCOUNTER — Encounter: Payer: Self-pay | Admitting: Family Medicine

## 2023-08-19 ENCOUNTER — Ambulatory Visit (INDEPENDENT_AMBULATORY_CARE_PROVIDER_SITE_OTHER): Admitting: Family Medicine

## 2023-08-19 VITALS — BP 127/85 | HR 84 | Ht 63.0 in | Wt 227.1 lb

## 2023-08-19 DIAGNOSIS — G43009 Migraine without aura, not intractable, without status migrainosus: Secondary | ICD-10-CM

## 2023-08-19 DIAGNOSIS — I10 Essential (primary) hypertension: Secondary | ICD-10-CM

## 2023-08-19 MED ORDER — TOPIRAMATE 25 MG PO TABS: 25.0000 mg | ORAL_TABLET | Freq: Every day | ORAL | 2 refills | Status: DC

## 2023-08-19 NOTE — Assessment & Plan Note (Signed)
 Vitals:   08/19/23 1042  BP: 127/85  Controlled, Continue Olmesartan 10 mg once daily Discussed with  patient to monitor their blood pressure regularly and maintain a heart-healthy diet rich in fruits, vegetables, whole grains, and low-fat dairy, while reducing sodium intake to less than 2,300 mg per day. Regular physical activity, such as 30 minutes of moderate exercise most days of the week, will help lower blood pressure and improve overall cardiovascular health. Avoiding smoking, limiting alcohol consumption, and managing stress. Take  prescribed medication, & take it as directed and avoid skipping doses. Seek emergency care if your blood pressure is (over 180/100) or you experience chest pain, shortness of breath, or sudden vision changes.Patient verbalizes understanding regarding plan of care and all questions answered.

## 2023-08-19 NOTE — Progress Notes (Signed)
 Established Patient Office Visit   Subjective  Patient ID: Linda Warner, female    DOB: 02/20/1991  Age: 33 y.o. MRN: [id number]  Chief Complaint  Patient presents with   Hospitalization Follow-up    Follow up from 03/21/24 for headache disorder Pt has made life style changes as well has been taking b/p meds regular but headaches are still chronic an severe . Also would like to ask for other wt. Loss meds as ozempic is no longer approved.     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  HPI Patient presents to the clinic for hypertension and migraine follow up. For the details of today's visit, please refer to assessment and plan.   Review of Systems  Constitutional:  Negative for chills and fever.  Eyes:  Negative for blurred vision.  Respiratory:  Negative for shortness of breath.   Cardiovascular:  Negative for chest pain.  Neurological:  Positive for headaches.      Objective:     BP 127/85   Pulse 84   Ht 5\' 3"  (1.6 m)   Wt 227 lb 1.9 oz (103 kg)   LMP 07/29/2023 (Exact Date)   SpO2 94%   BMI 40.23 kg/m  BP Readings from Last 3 Encounters:  08/19/23 127/85  05/22/23 (!) 126/96  05/06/23 109/77      Physical Exam Vitals reviewed.  Constitutional:      General: She is not in acute distress.    Appearance: Normal appearance. She is not ill-appearing, toxic-appearing or diaphoretic.  HENT:     Head: Normocephalic.     Right Ear: Tympanic membrane normal.     Left Ear: Tympanic membrane normal.  Eyes:     General:        Right eye: No discharge.        Left eye: No discharge.     Conjunctiva/sclera: Conjunctivae normal.     Pupils: Pupils are equal, round, and reactive to light.  Cardiovascular:     Rate and Rhythm: Normal rate.     Pulses: Normal pulses.     Heart sounds: Normal heart sounds.  Pulmonary:     Effort:  Pulmonary effort is normal. No respiratory distress.     Breath sounds: Normal breath sounds.  Musculoskeletal:     Cervical back: Normal range of motion.  Skin:    General: Skin is warm and dry.     Capillary Refill: Capillary refill takes less than 2 seconds.  Neurological:     Mental Status: She is alert.     Motor: No weakness.     Coordination: Coordination normal.     Gait: Gait normal.  Psychiatric:        Mood and Affect: Mood normal.        Behavior: Behavior normal.      No results found for any visits on 08/19/23.  The ASCVD Risk score (Arnett DK, et al., 2019) failed to calculate for the following reasons:   The 2019 ASCVD risk score is only valid for ages 27 to 9    Assessment & Plan:  Migraine without aura and without status migrainosus, not intractable Assessment & Plan: Patient continues to experience migraine headaches four times per week with no improvement. Previous treatments with Propranolol and Imitrex were ineffective. A trial of Topamax 25 mg once daily has been initiated. The patient recently visited the emergency department  for headache evaluation, and a Head CT was negative. A referral has been made to neurology for further evaluation.  Orders: -     Ambulatory referral to Neurology  Primary hypertension Assessment & Plan: Vitals:   08/19/23 1042  BP: 127/85  Controlled, Continue Olmesartan 10 mg once daily Discussed with  patient to monitor their blood pressure regularly and maintain a heart-healthy diet rich in fruits, vegetables, whole grains, and low-fat dairy, while reducing sodium intake to less than 2,300 mg per day. Regular physical activity, such as 30 minutes of moderate exercise most days of the week, will help lower blood pressure and improve overall cardiovascular health. Avoiding smoking, limiting alcohol consumption, and managing stress. Take  prescribed medication, & take it as directed and avoid skipping doses. Seek emergency care if  your blood pressure is (over 180/100) or you experience chest pain, shortness of breath, or sudden vision changes.Patient verbalizes understanding regarding plan of care and all questions answered.    Other orders -     Topiramate; Take 1 tablet (25 mg total) by mouth daily.  Dispense: 30 tablet; Refill: 2    Return in about 6 months (around 02/18/2024), or if symptoms worsen or fail to improve, for hypertension.   Cruzita Lederer Newman Nip, FNP

## 2023-08-19 NOTE — Patient Instructions (Addendum)
        Great to see you today.  I have refilled the medication(s) we provide.    Current approved weight loss not for type 2 diabetes injections include:  Wegovy   Zepbound (tirzepatide)  Saxenda (liraglutide)   If labs were collected, we will inform you of lab results once received either by echart message or telephone call.   - echart message- for normal results that have been seen by the patient already.   - telephone call: abnormal results or if patient has not viewed results in their echart.   - Please take medications as prescribed. - Follow up with your primary health provider if any health concerns arises. - If symptoms worsen please contact your primary care provider and/or visit the emergency department.

## 2023-08-19 NOTE — Assessment & Plan Note (Signed)
 Patient continues to experience migraine headaches four times per week with no improvement. Previous treatments with Propranolol and Imitrex were ineffective. A trial of Topamax 25 mg once daily has been initiated. The patient recently visited the emergency department for headache evaluation, and a Head CT was negative. A referral has been made to neurology for further evaluation.

## 2023-10-17 ENCOUNTER — Ambulatory Visit
Admission: EM | Admit: 2023-10-17 | Discharge: 2023-10-17 | Disposition: A | Discharge status: Home / Self Care | Payer: Self-pay | Attending: Physician Assistant | Admitting: Physician Assistant

## 2023-10-17 DIAGNOSIS — R8290 Unspecified abnormal findings in urine: Secondary | ICD-10-CM

## 2023-10-17 DIAGNOSIS — R350 Frequency of micturition: Secondary | ICD-10-CM

## 2023-10-17 DIAGNOSIS — N898 Other specified noninflammatory disorders of vagina: Secondary | ICD-10-CM

## 2023-10-17 LAB — POCT URINALYSIS DIP (MANUAL ENTRY)
Bilirubin, UA: NEGATIVE
Blood, UA: NEGATIVE
Glucose, UA: NEGATIVE mg/dL
Ketones, POC UA: NEGATIVE mg/dL
Leukocytes, UA: NEGATIVE
Nitrite, UA: NEGATIVE
Protein Ur, POC: NEGATIVE mg/dL
Spec Grav, UA: 1.025 (ref 1.010–1.025)
Urobilinogen, UA: 0.2 U/dL
pH, UA: 6 (ref 5.0–8.0)

## 2023-10-17 LAB — POCT URINE PREGNANCY: Preg Test, Ur: NEGATIVE

## 2023-10-17 MED ORDER — METRONIDAZOLE 500 MG PO TABS: 500.0000 mg | ORAL_TABLET | Freq: Two times a day (BID) | ORAL | 0 refills | Status: DC

## 2023-10-17 NOTE — ED Provider Notes (Signed)
 [facility]    CSN: [id number] Arrival date & time: 10/17/23  0948      History   Chief Complaint No chief complaint on file.   HPI Linda Warner is a 33 y.o. female.   Patient presents today with a several day history of UTI symptoms.  She reports cloudy urine, malodorous urine, frequency, urgency.  Denies any hematuria, dysuria, nausea, vomiting, fever.  She denies history of recurrent UTI.  Denies history of nephrolithiasis, single kidney, recent catheterization, urogenital procedure.  She denies history of diabetes and does not take SGLT2 inhibitor.  She denies any significant vaginal discharge.  She does have a history of recurrent BV but this has improved since she started taking boric acid vaginal suppositories.  She denies any recent antibiotics.  She does report some lower abdominal cramping/pelvic discomfort but this was worse yesterday.  She has been using over-the-counter cranberry juice to help manage her symptoms.    Past Medical History:  Diagnosis Date   Acne    BV (bacterial vaginosis)    Gestational hypertension    Miscarriage    Post partum depression 01/30/2013   Pregnant    Scoliosis    Scoliosis     Patient Active Problem List   Diagnosis Date Noted   Migraines 05/06/2023   Cervical cancer screening 03/17/2023   Hypertension 03/11/2023   Obesity, Class II, BMI 35-39.9 03/11/2023   History of preterm delivery 09/28/2019   History of gestational hypertension 09/04/2019   Abnormal chromosomal and genetic finding on antenatal screening of mother 04/03/2019   Family history of sickle cell disease 03/20/2019   Postpartum depression 01/30/2013   SCOLIOSIS 07/25/2008   MOOD SWINGS 10/14/2006    Past Surgical History:  Procedure Laterality Date   NO PAST SURGERIES      OB History     Gravida  6   Para  4   Term  3   Preterm  1   AB  1   Living  2      SAB  1   IAB  0   Ectopic  0   Multiple      Live Births  2             Home Medications    Prior to Admission medications   Medication Sig Start Date End Date Taking? Authorizing Provider  metroNIDAZOLE  (FLAGYL ) 500 MG tablet Take 1 tablet (500 mg total) by mouth 2 (two) times daily. 10/17/23  Yes Teffany Blaszczyk K, PA-C  BORIC ACID VAGINAL [facility] vaginally.    [provider]    Family History Family History  Problem Relation Age of Onset   Depression Father    Sickle cell anemia Son    ADD / ADHD Son    Depression Maternal Grandmother    Diabetes Maternal Grandmother    Hyperlipidemia Maternal Grandmother    Hypertension Maternal Grandmother    Cancer Maternal Grandmother        breast   Anesthesia problems Neg Hx     Social History Social History   Tobacco Use   Smoking status: Former    Current packs/day: 0.25    Average packs/day: 0.3 packs/day for 1.4 years (0.4 ttl pk-yrs)    Types: Cigarettes    Start date: 2024   Smokeless tobacco: Never  Vaping Use   Vaping status: Never Used  Substance Use Topics   Alcohol use: Yes    Comment: ocassional   Drug use:  No     Allergies   Amlodipine    Review of Systems Review of Systems  Constitutional:  Positive for activity change. Negative for appetite change, fatigue and fever.  Respiratory:  Negative for cough and shortness of breath.   Cardiovascular:  Negative for chest pain.  Gastrointestinal:  Positive for abdominal pain. Negative for diarrhea, nausea and vomiting.  Genitourinary:  Positive for frequency, pelvic pain and urgency. Negative for dysuria, hematuria, vaginal bleeding, vaginal discharge and vaginal pain.  Musculoskeletal:  Positive for back pain. Negative for arthralgias and myalgias.     Physical Exam Triage Vital Signs ED Triage Vitals  Encounter Vitals Group     BP 10/17/23 1009 121/76     Systolic BP Percentile --      Diastolic BP Percentile --      Pulse Rate 10/17/23 1009 90     Resp 10/17/23 1009 18     Temp 10/17/23 1009 98.3 F  (36.8 C)     Temp Source 10/17/23 1009 Oral     SpO2 10/17/23 1009 98 %     Weight --      Height --      Head Circumference --      Peak Flow --      Pain Score 10/17/23 1003 3     Pain Loc --      Pain Education --      Exclude from Growth Chart --    No data found.  Updated Vital Signs BP 121/76 (BP Location: Right Arm)   Pulse 90   Temp 98.3 F (36.8 C) (Oral)   Resp 18   LMP 09/28/2023 (Approximate)   SpO2 98%   Visual Acuity Right Eye Distance:   Left Eye Distance:   Bilateral Distance:    Right Eye Near:   Left Eye Near:    Bilateral Near:     Physical Exam Vitals reviewed. Exam conducted with a chaperone present.  Constitutional:      General: She is awake. She is not in acute distress.    Appearance: Normal appearance. She is well-developed. She is not ill-appearing.     Comments: Very pleasant female appears stated age in no acute distress sitting comfortably in exam room  HENT:     Head: Normocephalic and atraumatic.  Cardiovascular:     Rate and Rhythm: Normal rate and regular rhythm.     Heart sounds: Normal heart sounds, S1 normal and S2 normal. No murmur heard. Pulmonary:     Effort: Pulmonary effort is normal.     Breath sounds: Normal breath sounds. No wheezing, rhonchi or rales.     Comments: Clear to auscultation bilaterally Abdominal:     General: Bowel sounds are normal.     Palpations: Abdomen is soft.     Tenderness: There is no abdominal tenderness. There is no right CVA tenderness, left CVA tenderness, guarding or rebound.     Comments: Benign abdominal exam  Genitourinary:    Labia:        Right: No rash or tenderness.        Left: No rash or tenderness.      Vagina: Vaginal discharge present.     Cervix: Normal.     Uterus: Normal.      Adnexa: Right adnexa normal and left adnexa normal.     Comments: [name], CMA present as chaperone during exam.  Scant thin white discharge noted posterior vaginal vault.  No adnexal or CMT  tenderness on  exam. Psychiatric:        Behavior: Behavior is cooperative.      UC Treatments / Results  Labs (all labs ordered are listed, but only abnormal results are displayed) Labs Reviewed  URINE CULTURE  POCT URINALYSIS DIP (MANUAL ENTRY)  POCT URINE PREGNANCY  CERVICOVAGINAL ANCILLARY ONLY    EKG   Radiology No results found.  Procedures Procedures (including critical care time)  Medications Ordered in UC Medications - No data to display  Initial Impression / Assessment and Plan / UC Course  I have reviewed the triage vital signs and the nursing notes.  Pertinent labs & imaging results that were available during my care of the patient were reviewed by me and considered in my medical decision making (see chart for details).     Patient is well-appearing, afebrile, nontoxic, nontachycardic.  Vital signs and physical exam are reassuring with no indication for emergent evaluation or imaging.  She did not have any CMT or adnexal tenderness on exam so low suspicion for PID.  Her urine was normal but she does report significant symptoms including frequency/urgency malodorous urine.  I suspect her odor is more vaginal in nature and so we will treat for BV but since for culture given she is symptomatic.  We will defer additional treatment until culture results are available.  STI swab was collected and is pending.  We will contact her if we need to arrange additional treatment based on this result as well.  She is to use hypoallergenic soaps and detergents and wear loosefitting cotton underwear.  She was started on metronidazole  500 mg twice daily for 7 days and we discussed that she is not to drink any alcohol while on this medication due to associated Antabuse side effects.  We discussed that if anything worsens or changes and she develops abdominal pain, pelvic pain, fever, nausea, vomiting she should be seen immediately.  Strict return precautions given.  All questions answered  to patient satisfaction.  Final Clinical Impressions(s) / UC Diagnoses   Final diagnoses:  Malodorous urine  Urinary frequency  Vaginal odor     Discharge Instructions      Your urine was normal.  We will send this off for culture because of your symptoms and contact you if we need to arrange any treatment.  We are treating for BV.  Start metronidazole  twice daily for a week.  Do not drink any alcohol while on this medication and for 3 days after you finish the medicine as it will cause you to vomit.  Wear loosefitting cotton underwear and use hypoallergenic soaps and detergents.  We will contact you if we need to arrange any additional treatment based on your urine culture and swab.  If anything worsens and you have pelvic pain, abdominal pain, fever, nausea, vomiting you should be seen immediately.   ED Prescriptions     Medication Sig Dispense Auth. Provider   metroNIDAZOLE  (FLAGYL ) 500 MG tablet Take 1 tablet (500 mg total) by mouth 2 (two) times daily. 14 tablet Vadhir Mcnay K, PA-C      PDMP not reviewed this encounter.   Budd Cargo, PA-C 10/17/23 1132

## 2023-10-17 NOTE — Discharge Instructions (Signed)
 Your urine was normal.  We will send this off for culture because of your symptoms and contact you if we need to arrange any treatment.  We are treating for BV.  Start metronidazole  twice daily for a week.  Do not drink any alcohol while on this medication and for 3 days after you finish the medicine as it will cause you to vomit.  Wear loosefitting cotton underwear and use hypoallergenic soaps and detergents.  We will contact you if we need to arrange any additional treatment based on your urine culture and swab.  If anything worsens and you have pelvic pain, abdominal pain, fever, nausea, vomiting you should be seen immediately.

## 2023-10-17 NOTE — ED Triage Notes (Addendum)
 Pt reports she has a foul smelling odor in urine, low abdominal pain, and low back pain x 4 days  Pt also requested std testing

## 2023-10-18 ENCOUNTER — Ambulatory Visit (HOSPITAL_COMMUNITY): Payer: Self-pay

## 2023-10-18 LAB — CERVICOVAGINAL ANCILLARY ONLY
Bacterial Vaginitis (gardnerella): NEGATIVE
Candida Glabrata: NEGATIVE
Candida Vaginitis: NEGATIVE
Chlamydia: NEGATIVE
Comment: NEGATIVE
Comment: NEGATIVE
Comment: NEGATIVE
Comment: NEGATIVE
Comment: NEGATIVE
Comment: NORMAL
Neisseria Gonorrhea: NEGATIVE
Trichomonas: NEGATIVE

## 2023-10-18 LAB — URINE CULTURE

## 2023-10-24 ENCOUNTER — Encounter (HOSPITAL_COMMUNITY): Payer: Self-pay

## 2023-10-24 ENCOUNTER — Emergency Department (HOSPITAL_COMMUNITY)
Admission: EM | Admit: 2023-10-24 | Discharge: 2023-10-24 | Disposition: A | Discharge status: Home / Self Care | Payer: Self-pay | Attending: Emergency Medicine | Admitting: Emergency Medicine

## 2023-10-24 DIAGNOSIS — S81812A Laceration without foreign body, left lower leg, initial encounter: Secondary | ICD-10-CM | POA: Insufficient documentation

## 2023-10-24 DIAGNOSIS — W268XXA Contact with other sharp object(s), not elsewhere classified, initial encounter: Secondary | ICD-10-CM | POA: Insufficient documentation

## 2023-10-24 DIAGNOSIS — Z23 Encounter for immunization: Secondary | ICD-10-CM | POA: Insufficient documentation

## 2023-10-24 MED ORDER — ACETAMINOPHEN 500 MG PO TABS
1000.0000 mg | ORAL_TABLET | Freq: Once | ORAL | Status: AC
  Administered 2023-10-24: 1000 mg via ORAL
  Filled 2023-10-24: qty 2

## 2023-10-24 MED ORDER — IBUPROFEN 800 MG PO TABS
800.0000 mg | ORAL_TABLET | Freq: Once | ORAL | Status: AC
  Administered 2023-10-24: 800 mg via ORAL
  Filled 2023-10-24: qty 1

## 2023-10-24 MED ORDER — LIDOCAINE-EPINEPHRINE (PF) 2 %-1:200000 IJ SOLN
10.0000 mL | Freq: Once | INTRAMUSCULAR | Status: AC
  Administered 2023-10-24: 10 mL
  Filled 2023-10-24: qty 20

## 2023-10-24 MED ORDER — TETANUS-DIPHTH-ACELL PERTUSSIS 5-2.5-18.5 LF-MCG/0.5 IM SUSY
0.5000 mL | PREFILLED_SYRINGE | Freq: Once | INTRAMUSCULAR | Status: AC
  Administered 2023-10-24: 0.5 mL via INTRAMUSCULAR
  Filled 2023-10-24: qty 0.5

## 2023-10-24 NOTE — ED Notes (Signed)
 Pt/family received d/c paperwork at this time. After going over the paperwork any questions, comments, or concerns were answered to the best of this nurse's knowledge. The pt/family verbally acknowledged the teachings/instructions.

## 2023-10-24 NOTE — ED Provider Notes (Signed)
 [facility] Provider Note   CSN: [id number] Arrival date & time: 10/24/23  0120     History  Chief Complaint  Patient presents with   Laceration    Left thigh    Linda Warner is a 33 y.o. female.  Patient presents for evaluation of laceration to left thigh. She was getting out of a pool and caught her leg on a sharp piece of metal.       Home Medications Prior to Admission medications   Medication Sig Start Date End Date Taking? Authorizing Provider  BORIC ACID VAGINAL [facility] vaginally.    [provider]  metroNIDAZOLE  (FLAGYL ) 500 MG tablet Take 1 tablet (500 mg total) by mouth 2 (two) times daily. 10/17/23   Raspet, Erin K, PA-C      Allergies    Amlodipine     Review of Systems   Review of Systems  Physical Exam Updated Vital Signs BP (!) 139/97   Pulse (!) 109   Temp 98.7 F (37.1 C) (Oral)   Resp 18   LMP 09/28/2023 (Approximate)   SpO2 100%  Physical Exam Vitals and nursing note reviewed.  Constitutional:      Appearance: Normal appearance.  HENT:     Head: Atraumatic.  Musculoskeletal:        General: Signs of injury present. Normal range of motion.  Skin:    Findings: Laceration (2.5cm lateral left thigh) present.  Neurological:     Mental Status: She is alert.     Cranial Nerves: Cranial nerves 2-12 are intact.     Sensory: Sensation is intact.     Motor: Motor function is intact.     ED Results / Procedures / Treatments   Labs (all labs ordered are listed, but only abnormal results are displayed) Labs Reviewed - No data to display  EKG None  Radiology No results found.  Procedures .Laceration Repair  Date/Time: 10/24/2023 2:31 AM  Performed by: Ballard Bongo, MD Authorized by: Ballard Bongo, MD   Consent:    Consent obtained:  Verbal   Consent given by:  Patient   Risks, benefits, and alternatives were discussed: yes     Risks discussed:  Infection,  pain and retained foreign body Universal protocol:    Procedure explained and questions answered to patient or proxy's satisfaction: yes     Relevant documents present and verified: yes     Test results available: yes     Imaging studies available: yes     Required blood products, implants, devices, and special equipment available: yes     Site/side marked: yes     Immediately prior to procedure, a time out was called: yes     Patient identity confirmed:  Verbally with patient Anesthesia:    Anesthesia method:  Local infiltration   Local anesthetic:  Lidocaine  2% WITH epi Laceration details:    Location:  Leg   Leg location:  L upper leg   Length (cm):  2.5 Exploration:    Hemostasis achieved with:  Direct pressure   Contaminated: no   Treatment:    Area cleansed with:  Chlorhexidine   Amount of cleaning:  Standard   Irrigation solution:  Sterile saline   Irrigation method:  Syringe Skin repair:    Repair method:  Sutures   Suture size:  3-0   Suture material:  Nylon   Suture technique:  Simple interrupted   Number of sutures:  3  Approximation:    Approximation:  Close Repair type:    Repair type:  Simple Post-procedure details:    Dressing:  Adhesive bandage and non-adherent dressing   Procedure completion:  Tolerated well, no immediate complications     Medications Ordered in ED Medications  acetaminophen  (TYLENOL ) tablet 1,000 mg (1,000 mg Oral Given 10/24/23 0151)  ibuprofen  (ADVIL ) tablet 800 mg (800 mg Oral Given 10/24/23 0151)  lidocaine -EPINEPHrine (XYLOCAINE  W/EPI) 2 %-1:200000 (PF) injection 10 mL (10 mLs Infiltration Given 10/24/23 0155)  Tdap (BOOSTRIX ) injection 0.5 mL (0.5 mLs Intramuscular Given 10/24/23 0152)    ED Course/ Medical Decision Making/ A&P                                 Medical Decision Making Risk OTC drugs. Prescription drug management.   Presents to the emergency department for left leg laceration.  She was climbing out of a pool and  cut her leg on a piece of metal.  Booster for tetanus administered.  Wound was cleaned and sutures placed.  Will have suture removal in 10 days.        Final Clinical Impression(s) / ED Diagnoses Final diagnoses:  Laceration of left lower extremity, initial encounter    Rx / DC Orders ED Discharge Orders     None         Dereka Lueras, Marine Sia, MD 10/24/23 [phone]

## 2023-10-24 NOTE — ED Triage Notes (Addendum)
 Pt was getting out of the pool and did not notice a sharp object on the ladder. Pt states it was bleeding a lot prior to arrival to ED. Pt does not want anything that will make her "loopy". Pt hx is only HTN  Bleeding is controlled in triage.

## 2023-11-03 ENCOUNTER — Ambulatory Visit
Admission: RE | Admit: 2023-11-03 | Discharge: 2023-11-03 | Disposition: A | Discharge status: Home / Self Care | Source: Ambulatory Visit

## 2023-11-03 VITALS — BP 123/85 | HR 81 | Temp 98.0°F | Resp 16

## 2023-11-03 DIAGNOSIS — Z4802 Encounter for removal of sutures: Secondary | ICD-10-CM

## 2023-11-03 DIAGNOSIS — S81812A Laceration without foreign body, left lower leg, initial encounter: Secondary | ICD-10-CM | POA: Diagnosis not present

## 2023-11-03 NOTE — ED Triage Notes (Signed)
 Pt presents to UC for suture removal to left upper leg which were put in about 10 days ago. Denies concerns from suture site. 3 sutures in place.

## 2023-11-03 NOTE — ED Provider Notes (Signed)
 [facility]    CSN: [id number] Arrival date & time: 11/03/23  1153      History   Chief Complaint Chief Complaint  Patient presents with   Suture / Staple Removal    Entered by patient    HPI Linda Warner is a 33 y.o. female.   Presenting today for evaluation of suture removal to the left upper leg for which she was seen in the emergency department on 10/24/2023.  She states the area has been doing well with no bleeding, drainage, significant pain, fevers.  Has not been cleaning it with anything or applying anything to the area.    Past Medical History:  Diagnosis Date   Acne    BV (bacterial vaginosis)    Gestational hypertension    Miscarriage    Post partum depression 01/30/2013   Pregnant    Scoliosis    Scoliosis     Patient Active Problem List   Diagnosis Date Noted   Migraines 05/06/2023   Cervical cancer screening 03/17/2023   Hypertension 03/11/2023   Obesity, Class II, BMI 35-39.9 03/11/2023   History of preterm delivery 09/28/2019   History of gestational hypertension 09/04/2019   Abnormal chromosomal and genetic finding on antenatal screening of mother 04/03/2019   Family history of sickle cell disease 03/20/2019   Postpartum depression 01/30/2013   SCOLIOSIS 07/25/2008   MOOD SWINGS 10/14/2006    Past Surgical History:  Procedure Laterality Date   NO PAST SURGERIES      OB History     Gravida  6   Para  4   Term  3   Preterm  1   AB  1   Living  2      SAB  1   IAB  0   Ectopic  0   Multiple      Live Births  2            Home Medications    Prior to Admission medications   Medication Sig Start Date End Date Taking? Authorizing Provider  olmesartan  (BENICAR ) 20 MG tablet Take by mouth. 11/03/23  Yes [provider]  BORIC ACID VAGINAL [facility] vaginally.    [provider]  metroNIDAZOLE  (FLAGYL ) 500 MG tablet Take 1 tablet (500 mg total) by mouth 2 (two) times daily. 10/17/23    Raspet, Betsey Brow, PA-C    Family History Family History  Problem Relation Age of Onset   Depression Father    Sickle cell anemia Son    ADD / ADHD Son    Depression Maternal Grandmother    Diabetes Maternal Grandmother    Hyperlipidemia Maternal Grandmother    Hypertension Maternal Grandmother    Cancer Maternal Grandmother        breast   Anesthesia problems Neg Hx     Social History Social History   Tobacco Use   Smoking status: Former    Current packs/day: 0.25    Average packs/day: 0.3 packs/day for 1.5 years (0.4 ttl pk-yrs)    Types: Cigarettes    Start date: 2024   Smokeless tobacco: Never  Vaping Use   Vaping status: Never Used  Substance Use Topics   Alcohol use: Yes    Comment: ocassional   Drug use: No     Allergies   Amlodipine    Review of Systems Review of Systems Per HPI  Physical Exam Triage Vital Signs ED Triage Vitals  Encounter Vitals Group     BP  11/03/23 1227 123/85     Girls Systolic BP Percentile --      Girls Diastolic BP Percentile --      Boys Systolic BP Percentile --      Boys Diastolic BP Percentile --      Pulse Rate 11/03/23 1227 81     Resp 11/03/23 1227 16     Temp 11/03/23 1227 98 F (36.7 C)     Temp Source 11/03/23 1227 Oral     SpO2 11/03/23 1227 98 %     Weight --      Height --      Head Circumference --      Peak Flow --      Pain Score 11/03/23 1224 0     Pain Loc --      Pain Education --      Exclude from Growth Chart --    No data found.  Updated Vital Signs BP 123/85 (BP Location: Right Arm)   Pulse 81   Temp 98 F (36.7 C) (Oral)   Resp 16   LMP 09/28/2023 (Approximate)   SpO2 98%   Visual Acuity Right Eye Distance:   Left Eye Distance:   Bilateral Distance:    Right Eye Near:   Left Eye Near:    Bilateral Near:     Physical Exam Vitals and nursing note reviewed.  Constitutional:      Appearance: Normal appearance. She is not ill-appearing.  HENT:     Head: Atraumatic.   Eyes:      Extraocular Movements: Extraocular movements intact.     Conjunctiva/sclera: Conjunctivae normal.    Cardiovascular:     Rate and Rhythm: Normal rate.  Pulmonary:     Effort: Pulmonary effort is normal.   Musculoskeletal:        General: Normal range of motion.     Cervical back: Normal range of motion and neck supple.   Skin:    General: Skin is warm and dry.     Comments: Well healing laceration to the lateral aspect of the left upper leg with 3 simple interrupted sutures in place   Neurological:     Mental Status: She is alert and oriented to person, place, and time.   Psychiatric:        Mood and Affect: Mood normal.        Thought Content: Thought content normal.        Judgment: Judgment normal.      UC Treatments / Results  Labs (all labs ordered are listed, but only abnormal results are displayed) Labs Reviewed - No data to display  EKG   Radiology No results found.  Procedures Procedures (including critical care time)  Medications Ordered in UC Medications - No data to display  Initial Impression / Assessment and Plan / UC Course  I have reviewed the triage vital signs and the nursing notes.  Pertinent labs & imaging results that were available during my care of the patient were reviewed by me and considered in my medical decision making (see chart for details).     Vitals and exam reassuring today, 3 sutures removed without complication and discussed home wound care, return precautions.  Final Clinical Impressions(s) / UC Diagnoses   Final diagnoses:  Laceration of left lower extremity, initial encounter  Visit for suture removal   Discharge Instructions   None    ED Prescriptions   None    PDMP not reviewed this encounter.  Corbin Dess, [place] 11/03/23 1250

## 2023-11-15 ENCOUNTER — Ambulatory Visit
Admission: EM | Admit: 2023-11-15 | Discharge: 2023-11-15 | Disposition: A | Discharge status: Home / Self Care | Attending: Nurse Practitioner | Admitting: Nurse Practitioner

## 2023-11-15 DIAGNOSIS — H1012 Acute atopic conjunctivitis, left eye: Secondary | ICD-10-CM

## 2023-11-15 MED ORDER — OLOPATADINE HCL 0.2 % OP SOLN: 1.0000 [drp] | Freq: Two times a day (BID) | OPHTHALMIC | 0 refills | Status: AC

## 2023-11-15 NOTE — ED Provider Notes (Signed)
 [facility]    CSN: [id number] Arrival date & time: 11/15/23  0802      History   Chief Complaint No chief complaint on file.   HPI Linda Warner is a 33 y.o. female.   The history is provided by the patient.   Patient presents with a 1 day history of left eye redness, white drainage, and eye irritation.  She states that the eye does feel scratchy.  She states that she did go swimming prior to her symptoms starting.  She denies eye swelling, mucopurulent drainage, visual changes, or eyelid swelling or redness.  States that she does wear eyeglasses.  She denies any obvious close sick contacts.  States she has not used any medication for her symptoms.  Past Medical History:  Diagnosis Date   Acne    BV (bacterial vaginosis)    Gestational hypertension    Miscarriage    Post partum depression 01/30/2013   Pregnant    Scoliosis    Scoliosis     Patient Active Problem List   Diagnosis Date Noted   Migraines 05/06/2023   Cervical cancer screening 03/17/2023   Hypertension 03/11/2023   Obesity, Class II, BMI 35-39.9 03/11/2023   History of preterm delivery 09/28/2019   History of gestational hypertension 09/04/2019   Abnormal chromosomal and genetic finding on antenatal screening of mother 04/03/2019   Family history of sickle cell disease 03/20/2019   Postpartum depression 01/30/2013   SCOLIOSIS 07/25/2008   MOOD SWINGS 10/14/2006    Past Surgical History:  Procedure Laterality Date   NO PAST SURGERIES      OB History     Gravida  6   Para  4   Term  3   Preterm  1   AB  1   Living  2      SAB  1   IAB  0   Ectopic  0   Multiple      Live Births  2            Home Medications    Prior to Admission medications   Medication Sig Start Date End Date Taking? Authorizing Provider  BORIC ACID VAGINAL [facility] vaginally.    [provider]  metroNIDAZOLE  (FLAGYL ) 500 MG tablet Take 1 tablet (500 mg total) by mouth 2  (two) times daily. 10/17/23   Raspet, Erin K, PA-C  olmesartan  (BENICAR ) 20 MG tablet Take by mouth. 11/03/23   [provider]    Family History Family History  Problem Relation Age of Onset   Depression Father    Sickle cell anemia Son    ADD / ADHD Son    Depression Maternal Grandmother    Diabetes Maternal Grandmother    Hyperlipidemia Maternal Grandmother    Hypertension Maternal Grandmother    Cancer Maternal Grandmother        breast   Anesthesia problems Neg Hx     Social History Social History   Tobacco Use   Smoking status: Former    Current packs/day: 0.25    Average packs/day: 0.3 packs/day for 1.5 years (0.4 ttl pk-yrs)    Types: Cigarettes    Start date: 2024   Smokeless tobacco: Never  Vaping Use   Vaping status: Never Used  Substance Use Topics   Alcohol use: Yes    Comment: ocassional   Drug use: No     Allergies   Amlodipine    Review of Systems Review of Systems Per HPI  Physical Exam Triage Vital Signs ED Triage Vitals  Encounter Vitals Group     BP 11/15/23 0834 130/83     Girls Systolic BP Percentile --      Girls Diastolic BP Percentile --      Boys Systolic BP Percentile --      Boys Diastolic BP Percentile --      Pulse Rate 11/15/23 0834 91     Resp 11/15/23 0834 20     Temp 11/15/23 0834 97.9 F (36.6 C)     Temp Source 11/15/23 0834 Oral     SpO2 11/15/23 0834 97 %     Weight --      Height --      Head Circumference --      Peak Flow --      Pain Score 11/15/23 0835 0     Pain Loc --      Pain Education --      Exclude from Growth Chart --    No data found.  Updated Vital Signs BP 130/83 (BP Location: Right Arm)   Pulse 91   Temp 97.9 F (36.6 C) (Oral)   Resp 20   LMP 10/29/2023 (Approximate)   SpO2 97%   Visual Acuity Right Eye Distance: 20/15 Left Eye Distance: 20/15 Bilateral Distance: 20/15  Right Eye Near:   Left Eye Near:    Bilateral Near:     Physical Exam Vitals and nursing note  reviewed.  Constitutional:      General: She is not in acute distress.    Appearance: Normal appearance.  HENT:     Head: Normocephalic.     Nose: Nose normal.     Mouth/Throat:     Mouth: Mucous membranes are moist.   Eyes:     General: Lids are normal. Vision grossly intact. Allergic shiner present. No visual field deficit.       Right eye: No foreign body, discharge or hordeolum.        Left eye: No foreign body, discharge or hordeolum.     Extraocular Movements: Extraocular movements intact.     Right eye: Normal extraocular motion and no nystagmus.     Left eye: Normal extraocular motion and no nystagmus.     Conjunctiva/sclera:     Right eye: Right conjunctiva is not injected. No chemosis, exudate or hemorrhage.    Left eye: Left conjunctiva is injected. No chemosis, exudate or hemorrhage.    Pupils: Pupils are equal, round, and reactive to light.    Cardiovascular:     Pulses: Normal pulses.     Heart sounds: Normal heart sounds.  Pulmonary:     Effort: Pulmonary effort is normal. No respiratory distress.     Breath sounds: Normal breath sounds. No stridor. No wheezing, rhonchi or rales.   Musculoskeletal:     Cervical back: Normal range of motion.   Skin:    General: Skin is warm and dry.   Neurological:     General: No focal deficit present.     Mental Status: She is alert and oriented to person, place, and time.   Psychiatric:        Mood and Affect: Mood normal.        Behavior: Behavior normal.      UC Treatments / Results  Labs (all labs ordered are listed, but only abnormal results are displayed) Labs Reviewed - No data to display  EKG   Radiology No results found.  Procedures Procedures (including  critical care time)  Medications Ordered in UC Medications - No data to display  Initial Impression / Assessment and Plan / UC Course  I have reviewed the triage vital signs and the nursing notes.  Pertinent labs & imaging results that were  available during my care of the patient were reviewed by me and considered in my medical decision making (see chart for details).  Symptoms consistent with allergic conjunctivitis.  Do not suspect bacterial conjunctivitis at this time as patient denies mucopurulent drainage, constant drainage, or eye pain.  Visual acuity was intact.  Will treat with Pataday 0.2% eyedrops.  Supportive care recommendations were provided and discussed with the patient to include over-the-counter analgesics, cool compresses to the eye, use of over-the-counter eyedrops such as Clear Eyes or Visine, and discarding any eye make-up along with strict hand hygiene.  Discussed indications with patient regarding follow-up.  Patient was in agreement with this plan of care and verbalizes understanding.  All questions were answered.  Patient stable for discharge.  Final Clinical Impressions(s) / UC Diagnoses   Final diagnoses:  None   Discharge Instructions   None    ED Prescriptions   None    PDMP not reviewed this encounter.   Gilmer Etta PARAS, NP 11/15/23 [id number]

## 2023-11-15 NOTE — Discharge Instructions (Signed)
 Use eyedrops as prescribed.   Cool compresses to the eyes to help with pain or swelling. Recommend over-the-counter eyedrops such as Clear Eyes or Visine to help keep the eye moist and lubricated.  Strict handwashing when applying medication. Avoid rubbing or manipulating the eyes while symptoms persist. Discard any eye make-up that were using prior to your symptoms starting. Follow-up if symptoms do not improve.

## 2023-11-15 NOTE — ED Triage Notes (Signed)
 Pt reports her left eye is red and had white discharge after swimming x 1 day

## 2023-12-01 ENCOUNTER — Telehealth: Payer: Self-pay | Admitting: Emergency Medicine

## 2023-12-01 NOTE — Telephone Encounter (Signed)
 Patient called and wanted to review her urine culture results from June 1st.  Patient denies any urinary symptoms only vaginal itching.  States she just started metronidazole  for that.  Results reviewed and patient verbalized understanding.

## 2023-12-20 ENCOUNTER — Encounter: Payer: Self-pay | Admitting: Neurology

## 2023-12-20 ENCOUNTER — Encounter: Payer: Self-pay | Admitting: *Deleted

## 2023-12-20 ENCOUNTER — Ambulatory Visit (INDEPENDENT_AMBULATORY_CARE_PROVIDER_SITE_OTHER): Admitting: Neurology

## 2023-12-20 VITALS — BP 115/80 | HR 88 | Ht 63.0 in | Wt 217.0 lb

## 2023-12-20 DIAGNOSIS — R0683 Snoring: Secondary | ICD-10-CM

## 2023-12-20 DIAGNOSIS — R351 Nocturia: Secondary | ICD-10-CM

## 2023-12-20 DIAGNOSIS — R0681 Apnea, not elsewhere classified: Secondary | ICD-10-CM | POA: Diagnosis not present

## 2023-12-20 DIAGNOSIS — G43019 Migraine without aura, intractable, without status migrainosus: Secondary | ICD-10-CM

## 2023-12-20 DIAGNOSIS — R519 Headache, unspecified: Secondary | ICD-10-CM

## 2023-12-20 MED ORDER — TOPIRAMATE 25 MG PO TABS: 50.0000 mg | ORAL_TABLET | Freq: Every day | ORAL | 3 refills | Status: DC

## 2023-12-20 MED ORDER — RIZATRIPTAN BENZOATE 5 MG PO TABS: 5.0000 mg | ORAL_TABLET | ORAL | 2 refills | Status: DC | PRN

## 2023-12-20 NOTE — Patient Instructions (Signed)
 It was nice to meet you today.   As discussed, your headaches are likely due to a combination of factors.   Here is what we discussed today and my recommendations for you:   Please remember, common headache triggers are: sleep deprivation, dehydration, overheating, stress, hypoglycemia or skipping meals and blood sugar fluctuations, excessive pain medications or excessive alcohol use or caffeine  withdrawal. Some people have food triggers such as aged cheese, orange juice or chocolate, especially dark chocolate, or MSG (monosodium glutamate). Try to avoid these headache triggers as much possible. It may be helpful to keep a headache diary to figure out what makes your headaches worse or brings them on and what alleviates them. Some people report headache onset after exercise but studies have shown that regular exercise may actually prevent headaches from coming. If you have exercise-induced headaches, please make sure that you drink plenty of fluid before and after exercising and that you do not over do it and do not overheat. Please reduce and avoid taking frequent ibuprofen  as it may perpetuate your headaches.   Continue to avoid caffeine  and try to hydrate well with water.   Stay up-to-date with your eye examinations.   We may consider a brain MRI down the road.   I will order a sleep study to look for signs of obstructive sleep apnea (aka OSA). As explained, the long-term risks and ramifications of untreated moderate to severe obstructive sleep apnea may include (but are not limited to): increased risk for cardiovascular disease, including congestive heart failure, stroke, difficult to control hypertension, treatment resistant obesity, arrhythmias, especially irregular heartbeat commonly known as A. Fib. (atrial fibrillation); even type 2 diabetes has been linked to untreated OSA.  For headache prevention, I suggest we try you back on topiramate  25 mg strength once daily in the evening for the first  week and then increase it to 2 pills once daily in the evening thereafter.  Keep in mind that it can cause sedation and taste changes.  Higher doses of topiramate  can interfere with oral contraceptive pills and make them less effective.   For as needed use, we will try you on Maxalt  tab, 5 mg: take 1 pill early on when you suspect a migraine attack come on. You may take another pill after 2 hours, no more than 2 pills in 24 hours. Most people who take triptans do not have any serious side-effects. However, they can cause drowsiness (remember to not drive or use heavy machinery when drowsy), nausea, dizziness, dry mouth. Less common side effects include strange sensations, such as tightness in your chest or throat, tingling, flushing, and feelings of heaviness or pressure in areas such as the face, limbs, and chest. These in the chest can mimic heart related pain (angina) and may cause alarm, but usually these sensations are not harmful or a sign of a heart attack. However, if you develop intense chest pain or sensations of discomfort, you should stop taking your medication and consult with me or your PCP or go to the nearest urgent care facility or ER or call 911.  We will plan a follow up in about 4 months.

## 2023-12-20 NOTE — Progress Notes (Signed)
 Subjective:    Patient ID: Linda Warner is a 33 y.o. female.  HPI    True Mar, MD, PhD [facility] [address] [address] [place], [place] [address]  Dear Hilario,  I saw your patient, Linda Warner, upon your kind request in my neurologic clinic today for initial consultation of her recurrent headaches, concern for migraines.  The patient is unaccompanied today.  As you know, Linda Warner is a 33 year old female with an underlying medical history of gestational hypertension, postpartum depression, scoliosis, and severe obesity with a BMI of over 40, who reports an approximately 1-1/2-year history of recurrent headaches.  Headaches are in the bifrontal area or sometimes at the base of her head, sometimes headache is stabbing or throbbing and sometimes it is constant.  She has been taking ibuprofen  over-the-counter, up to 3 times a week, up to twice daily, 400 mg.  She stopped taking the topiramate  recently, did not increase the dose and did not have intolerance to it.  She would be willing to restart it.  She has not been on a triptan but tried propranolol  in the past.  She denies any obvious triggers, she tries to hydrate well, she estimates that she drinks at least 4-5 bottles of water per day, 16.9 ounce size each.  She drinks alcohol occasionally.  She vapes, but not daily, she quit smoking last year.  She is up-to-date with her eye examination and had an eye exam earlier this year with new eyeglasses.  She has been started on blood pressure medication less than a year ago and feels that her headaches actually improved after that.  She snores, she has never had a sleep study but has been encouraged to get checked out by her stepfather.  He noticed apneic pauses. 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  She tries to be in bed between 10 and 11 and rise time is around 7.  She has nocturia about twice per average night and has woken up  occasionally with a headache.  She denies associated nausea or vomiting with her headaches, has occasional photophobia.   She has been tried on propranolol  and has tried Imitrex in the past.  I reviewed your office note from 08/19/2023.  She was started on topiramate  25 mg strength once daily at the time.  She had a head CT without contrast through [facility] health emergency department at [facility] on 05/22/2023 with indication of headache, I reviewed the results:  IMPRESSION: Negative head CT.  No explanation for headache.     In addition, I personally and independently reviewed images through the PACS system.  Her Past Medical History Is Significant For: Past Medical History:  Diagnosis Date   Acne    BV (bacterial vaginosis)    Gestational hypertension    Miscarriage    Post partum depression 01/30/2013   Pregnant    Scoliosis    Scoliosis     Her Past Surgical History Is Significant For: Past Surgical History:  Procedure Laterality Date   NO PAST SURGERIES      Her Family History Is Significant For: Family History  Problem Relation Age of Onset   Depression Father    Depression Maternal Grandmother    Diabetes Maternal Grandmother    Hyperlipidemia Maternal Grandmother    Hypertension Maternal Grandmother    Cancer Maternal Grandmother        breast   Sickle cell anemia Son  ADD / ADHD Son    Anesthesia problems Neg Hx    Sleep apnea Neg Hx    Migraines Neg Hx     Her Social History Is Significant For: Social History   Socioeconomic History   Marital status: Married    Spouse name: Soniya Ashraf   Number of children: 2   Years of education: 13   Highest education level: High school graduate  Occupational History   Occupation: Electronics engineer: [employer]    Comment: works from home.  Tobacco Use   Smoking status: Former    Current packs/day: 0.25    Average packs/day: 0.3 packs/day for 1.6 years (0.4 ttl pk-yrs)    Types:  Cigarettes    Start date: 2024   Smokeless tobacco: Never  Vaping Use   Vaping status: Never Used  Substance and Sexual Activity   Alcohol use: Yes    Comment: ocassional   Drug use: No   Sexual activity: Yes    Birth control/protection: None  Other Topics Concern   Not on file  Social History Narrative   ** Merged History Encounter **    Pt works    Pt lives with kids    Social Drivers of Health   Financial Resource Strain: Low Risk  (09/28/2019)   Overall Financial Resource Strain (CARDIA)    Difficulty of Paying Living Expenses: Not very hard  Food Insecurity: No Food Insecurity (09/28/2019)   Hunger Vital Sign    Worried About Running Out of Food in the Last Year: Never true    Ran Out of Food in the Last Year: Never true  Transportation Needs: No Transportation Needs (09/28/2019)   PRAPARE - Administrator, Civil Service (Medical): No    Lack of Transportation (Non-Medical): No  Physical Activity: Inactive (09/28/2019)   Exercise Vital Sign    Days of Exercise per Week: 0 days    Minutes of Exercise per Session: 0 min  Stress: No Stress Concern Present (09/28/2019)   [employer] of Occupational Health - Occupational Stress Questionnaire    Feeling of Stress : Only a little  Social Connections: Moderately Isolated (09/28/2019)   Social Connection and Isolation Panel    Frequency of Communication with Friends and Family: More than three times a week    Frequency of Social Gatherings with Friends and Family: Once a week    Attends Religious Services: 1 to 4 times per year    Active Member of [employer] or Organizations: No    Attends Banker Meetings: Never    Marital Status: Separated    Her Allergies Are:  Allergies  Allergen Reactions   Amlodipine  Rash  :   Her Current Medications Are:  Outpatient Encounter Medications as of 12/20/2023  Medication Sig   BORIC ACID VAGINAL VA Place vaginally.   olmesartan  (BENICAR ) 20 MG tablet Take by  mouth.   metroNIDAZOLE  (FLAGYL ) 500 MG tablet Take 1 tablet (500 mg total) by mouth 2 (two) times daily.   No facility-administered encounter medications on file as of 12/20/2023.  :   Review of Systems:  Out of a complete 14 point review of systems, all are reviewed and negative with the exception of these symptoms as listed below:   Review of Systems  Neurological:        Pt here for migraines Pt states 12 migraines in last month Pt state snores ,hypertension     Objective:  Neurological Exam  Physical Exam Physical Examination:   Vitals:   12/20/23 0751  BP: 115/80  Pulse: 88    General Examination: The patient is a very pleasant 33 y.o. female in no acute distress. She appears well-developed and well-nourished and well groomed.   HEENT: Normocephalic, atraumatic, pupils are equal, round and reactive to light, no photophobia, funduscopic exam benign.  Extraocular tracking is good without limitation to gaze excursion or nystagmus noted. Hearing is grossly intact to tuning fork. Face is symmetric with normal facial animation and normal facial sensation to light touch, temperature and vibration sense. Speech is clear with no dysarthria noted. There is no hypophonia. There is no lip, neck/head, jaw or voice tremor. Neck is supple with full range of passive and active motion. There are no carotid bruits on auscultation. Oropharynx exam reveals: mild mouth dryness, good dental hygiene and mild airway crowding, due to small airway entry, tonsils on the smaller side, Mallampati class II.  Neck circumference 14 7/8 inches.  Tongue protrudes centrally and palate elevates symmetrically.  Chest: Clear to auscultation without wheezing, rhonchi or crackles noted.  Heart: S1+S2+0, regular and normal without murmurs, rubs or gallops noted.   Abdomen: Soft, non-tender and non-distended.  Extremities: There is no pitting edema in the distal lower extremities bilaterally.   Skin: Warm and dry  without trophic changes noted.   Musculoskeletal: exam reveals no obvious joint deformities.   Neurologically:  Mental status: The patient is awake, alert and oriented in all 4 spheres. Her immediate and remote memory, attention, language skills and fund of knowledge are appropriate. There is no evidence of aphasia, agnosia, apraxia or anomia. Speech is clear with normal prosody and enunciation. Thought process is linear. Mood is normal and affect is normal.  Cranial nerves II - XII are as described above under HEENT exam.  Motor exam: Normal bulk, strength and tone is noted. There is no obvious action or resting tremor.  No drift or rebound, no postural or intention tremor. Reflexes 2+ throughout, toes are downgoing bilaterally. Fine motor skills and coordination: intact finger taps, hand movements and rapid alternating patting with both upper extremities, normal foot taps bilaterally in the lower extremities.  Cerebellar testing: No dysmetria or intention tremor. There is no truncal or gait ataxia.  Normal finger-to-nose, normal heel-to-shin bilaterally. Romberg negative. Sensory exam: intact to light touch in the upper and lower extremities.  Gait, station and balance: She stands easily. No veering to one side is noted. No leaning to one side is noted. Posture is age-appropriate and stance is narrow based. Gait shows normal stride length and normal pace. No problems turning are noted.  Normal tandem walk.  Assessment and Plan:  In summary, Linda Warner is a very pleasant 33 year old female with an underlying medical history of gestational hypertension, postpartum depression, scoliosis, and severe obesity with a BMI of over 40, who presents for evaluation of her recurrent headaches of approximately 1-1/2 years duration with some migrainous component noted.  In addition, medication overuse headaches may be a component as well as recurrent headaches from hypertension and sleep disordered breathing  not completely excluded.  Strain on the eyes is another possibility but she is up-to-date with her eye examination and she started blood pressure medication a few months back.  She is counseled on eliminating vaping altogether and staying well-hydrated and well rested, watch for triggers.  Neurological exam is nonfocal and she is reassured in this regard, recent head CT scan was benign.  Below is a  summary of my recommendations and our discussion points today based on chart review, history and examination.  She was given these instructions in detail during the visit and also in her after visit summary in MyChart which she can access electronically:  <<  Please remember, common headache triggers are: sleep deprivation, dehydration, overheating, stress, hypoglycemia or skipping meals and blood sugar fluctuations, excessive pain medications or excessive alcohol use or caffeine  withdrawal. Some people have food triggers such as aged cheese, orange juice or chocolate, especially dark chocolate, or MSG (monosodium glutamate). Try to avoid these headache triggers as much possible. It may be helpful to keep a headache diary to figure out what makes your headaches worse or brings them on and what alleviates them. Some people report headache onset after exercise but studies have shown that regular exercise may actually prevent headaches from coming. If you have exercise-induced headaches, please make sure that you drink plenty of fluid before and after exercising and that you do not over do it and do not overheat. Please reduce and avoid taking frequent ibuprofen  as it may perpetuate your headaches.   Continue to avoid caffeine  and try to hydrate well with water.   Stay up-to-date with your eye examinations.   We may consider a brain MRI down the road.   I will order a sleep study to look for signs of obstructive sleep apnea (aka OSA). As explained, the long-term risks and ramifications of untreated moderate to  severe obstructive sleep apnea may include (but are not limited to): increased risk for cardiovascular disease, including congestive heart failure, stroke, difficult to control hypertension, treatment resistant obesity, arrhythmias, especially irregular heartbeat commonly known as A. Fib. (atrial fibrillation); even type 2 diabetes has been linked to untreated OSA.  For headache prevention, I suggest we try you back on topiramate  25 mg strength once daily in the evening for the first week and then increase it to 2 pills once daily in the evening thereafter.  Keep in mind that it can cause sedation and taste changes.  Higher doses of topiramate  can interfere with oral contraceptive pills and make them less effective.   For as needed use, we will try you on Maxalt  tab, 5 mg: take 1 pill early on when you suspect a migraine attack come on. You may take another pill after 2 hours, no more than 2 pills in 24 hours. Most people who take triptans do not have any serious side-effects. However, they can cause drowsiness (remember to not drive or use heavy machinery when drowsy), nausea, dizziness, dry mouth. Less common side effects include strange sensations, such as tightness in your chest or throat, tingling, flushing, and feelings of heaviness or pressure in areas such as the face, limbs, and chest. These in the chest can mimic heart related pain (angina) and may cause alarm, but usually these sensations are not harmful or a sign of a heart attack. However, if you develop intense chest pain or sensations of discomfort, you should stop taking your medication and consult with me or your PCP or go to the nearest urgent care facility or ER or call 911.  We will plan a follow up in about 4 months.  >>  This was an extended visit of over 60 minutes with copious record review involved in considerable counseling and coordination of care.  Thank you very much for allowing me to participate in the care of this nice patient.  If I can be of any further assistance to you  please do not hesitate to call me at [phone].  Sincerely,   True Mar, MD, PhD

## 2023-12-22 ENCOUNTER — Telehealth: Payer: Self-pay | Admitting: Neurology

## 2023-12-22 NOTE — Telephone Encounter (Signed)
 [facility] [employer] no auth req via fax

## 2023-12-22 NOTE — Telephone Encounter (Signed)
 [facility] [employer] pending

## 2023-12-23 NOTE — Telephone Encounter (Signed)
 Spoke to the patient.   [facility] [facility] [employer] no auth req   She is scheduled at [facility] for 01/07/2024 at 9 pm.  Mailed packet to the patient and sent mychart

## 2024-01-07 ENCOUNTER — Encounter

## 2024-01-10 NOTE — Telephone Encounter (Signed)
 I spoke with the patient to r/s her SS.  She is r/s for 02/11/2024 at 9 pm.  Mailed new packet to the patient.

## 2024-01-25 ENCOUNTER — Telehealth: Payer: Self-pay | Admitting: Family Medicine

## 2024-01-25 NOTE — Telephone Encounter (Unsigned)
 Copied from CRM #[id number]. Topic: General - Other >> Jan 25, 2024  9:28 AM Tiffini S wrote: Reason for CRM: Patient called to schedule a appointment for weight loss- asked for a call back for copay amount and to verify which labs are needed for appointment on 02/07/24. Please call the patient at [phone].

## 2024-01-25 NOTE — Telephone Encounter (Signed)
Spoke with patient answered all questions

## 2024-01-28 ENCOUNTER — Other Ambulatory Visit: Payer: Self-pay | Admitting: Family Medicine

## 2024-01-28 DIAGNOSIS — I10 Essential (primary) hypertension: Secondary | ICD-10-CM

## 2024-02-07 ENCOUNTER — Ambulatory Visit: Payer: Self-pay

## 2024-02-07 ENCOUNTER — Ambulatory Visit: Payer: Self-pay | Admitting: Nurse Practitioner

## 2024-02-07 ENCOUNTER — Other Ambulatory Visit: Payer: Self-pay

## 2024-02-07 ENCOUNTER — Ambulatory Visit (INDEPENDENT_AMBULATORY_CARE_PROVIDER_SITE_OTHER)

## 2024-02-07 VITALS — BP 125/85 | HR 102 | Ht 62.0 in | Wt 209.0 lb

## 2024-02-07 DIAGNOSIS — R300 Dysuria: Secondary | ICD-10-CM

## 2024-02-07 DIAGNOSIS — E66812 Obesity, class 2: Secondary | ICD-10-CM | POA: Diagnosis not present

## 2024-02-07 MED ORDER — WEGOVY 0.25 MG/0.5ML ~~LOC~~ SOAJ: 0.2500 mg | SUBCUTANEOUS | 1 refills | Status: DC

## 2024-02-07 MED ORDER — SULFAMETHOXAZOLE-TRIMETHOPRIM 800-160 MG PO TABS: 1.0000 | ORAL_TABLET | Freq: Two times a day (BID) | ORAL | 0 refills | Status: AC

## 2024-02-07 NOTE — Telephone Encounter (Signed)
 FYI Only or Action Required?: FYI only for provider.  Patient was last seen in primary care on 08/19/2023 by Terry Wilhelmena Lloyd Hilario, FNP.  Called Nurse Triage reporting Urinary Frequency.  Symptoms began 1-2 weeks ago.  Interventions attempted: Nothing.  Symptoms are: unchanged.  Triage Disposition: See Physician Within 24 Hours  Patient/caregiver understands and will follow disposition?: Yes                                      Reason for Disposition  Urinating more frequently than usual (i.e., frequency) OR new-onset of the feeling of an urgent need to urinate (i.e., urgency)  Answer Assessment - Initial Assessment Questions 1. SYMPTOM: What's the main symptom you're concerned about? (e.g., frequency, incontinence)     Cloudy urine and foul odor 2. ONSET: When did the symptoms start?     1-2 weeks 3. PAIN: Is there any pain? If Yes, ask: How bad is it? (Scale: 1-10; mild, moderate, severe)     Denies 4. CAUSE: What do you think is causing the symptoms?     UTI or BV 5. OTHER SYMPTOMS: Do you have any other symptoms? (e.g., blood in urine, fever, flank pain, pain with urination)     Urinary frequency, urinary urgency, denies abdominal pain, denies back pain, denies fever, denies hematuria    Advised evaluation within 24 hours. Patient stated she is already coming to the office this afternoon for a separate issue (weight loss). CAL on lunch at time of call, so this RN could not verify with office if acute urinary issues could also be addressed at OV. This RN encouraged patient to discuss acute issues with provider today.  Protocols used: Urinary Symptoms-A-AH

## 2024-02-07 NOTE — Progress Notes (Unsigned)
 Established Patient Office Visit  Subjective   Patient ID: Linda Warner, female    DOB: 1991/01/20  Age: 33 y.o. MRN: [id number]  Chief Complaint  Patient presents with   Urinary Tract Infection    Urinary frequency with odor , dark in color and cloudy   Obesity    Discuss options    Urinary Tract Infection   Discussed the use of AI scribe software for clinical note transcription with the patient, who gave verbal consent to proceed.  History of Present Illness   Linda Warner is a 33 year old female who presents with symptoms suggestive of a urinary tract infection (UTI).  Lower urinary tract symptoms - Symptoms present for approximately 1.5 weeks - Frequent urination with minimal urinary output - Noticeable malodor to urine, especially in the morning or after holding urine - No dysuria - No back pain - No specific medication taken for urinary symptoms  Gynecologic history and interventions - History of bacterial vaginosis - Currently using boric acid suppositories for bacterial vaginosis  Hydration status - Drinks only water consistently throughout this year - No excessive thirst       Patient Active Problem List   Diagnosis Date Noted   Dysuria 02/10/2024   Migraines 05/06/2023   Cervical cancer screening 03/17/2023   Hypertension 03/11/2023   Obesity, Class II, BMI 35-39.9 03/11/2023   History of preterm delivery 09/28/2019   History of gestational hypertension 09/04/2019   Abnormal chromosomal and genetic finding on antenatal screening of mother 04/03/2019   Family history of sickle cell disease 03/20/2019   Postpartum depression 01/30/2013   SCOLIOSIS 07/25/2008   MOOD SWINGS 10/14/2006      ROS    Objective:     BP 125/85   Pulse (!) 102   Ht 5' 2 (1.575 m)   Wt 209 lb (94.8 kg)   SpO2 98%   BMI 38.23 kg/m  BP Readings from Last 3 Encounters:  02/07/24 125/85  12/20/23 115/80  11/15/23 130/83   Wt Readings from Last 3 Encounters:   02/07/24 209 lb (94.8 kg)  12/20/23 217 lb (98.4 kg)  08/19/23 227 lb 1.9 oz (103 kg)     Physical Exam Vitals and nursing note reviewed.  Constitutional:      Appearance: Normal appearance.  HENT:     Head: Normocephalic.  Eyes:     Extraocular Movements: Extraocular movements intact.     Pupils: Pupils are equal, round, and reactive to light.  Cardiovascular:     Rate and Rhythm: Normal rate and regular rhythm.  Pulmonary:     Effort: Pulmonary effort is normal.     Breath sounds: Normal breath sounds.  Musculoskeletal:     Cervical back: Normal range of motion and neck supple.  Neurological:     Mental Status: She is alert and oriented to person, place, and time.  Psychiatric:        Mood and Affect: Mood normal.        Thought Content: Thought content normal.       The ASCVD Risk score (Arnett DK, et al., 2019) failed to calculate for the following reasons:   The 2019 ASCVD risk score is only valid for ages 13 to 19    Assessment & Plan:   Problem List Items Addressed This Visit       Other   Obesity, Class II, BMI 35-39.9   Obesity previously managed with Wegovy . Insurance coverage for Wegovy  uncertain. - Submit  [facility]  prescription to [facility] and verify insurance coverage.      Relevant Medications   semaglutide -weight management (WEGOVY ) 0.25 MG/0.5ML SOAJ SQ injection   Dysuria - Primary   Symptoms suggest UTI.  Urine culture pending. - Prescribe antibiotic BID for 7 days. - Contact via MyChart with culture results and adjust antibiotic if necessary.      Relevant Medications   sulfamethoxazole -trimethoprim  (BACTRIM  DS) 800-160 MG tablet   Other Relevant Orders   Urine Culture (Completed)      No follow-ups on file.    Leita Longs, FNP

## 2024-02-07 NOTE — Telephone Encounter (Signed)
 Patient scheduled.

## 2024-02-07 NOTE — Telephone Encounter (Signed)
 This RN made first attempt to triage patient. Patient answered and requested a call back in 30 minutes because she is at the [facility]. Routing to call back folder for additional attempts.   Copied from CRM [id number]. Topic: Clinical - Red Word Triage >> Feb 07, 2024  8:18 AM Emylou G wrote: [facility] that prompted transfer to Nurse Triage: attempted to make an appt for UTI/smell of urine and burns.. Patient didn't want to continue call with us ?

## 2024-02-08 ENCOUNTER — Other Ambulatory Visit (HOSPITAL_COMMUNITY): Payer: Self-pay

## 2024-02-08 ENCOUNTER — Telehealth: Payer: Self-pay | Admitting: Pharmacy Technician

## 2024-02-08 NOTE — Telephone Encounter (Signed)
 PA request has been Started. New Encounter has been or will be created for follow up. For additional info see Pharmacy Prior Auth telephone encounter from 02/08/2024.

## 2024-02-08 NOTE — Telephone Encounter (Signed)
 Pharmacy Patient Advocate Encounter   Received notification from Patient Advice Request messages that prior authorization for Wegovy  0.25mg /0.7ml auto-injectors is required/requested.   Insurance verification completed.   The patient is insured through [employer] (COMMERCIAL) .   Per test claim: PA required; PA submitted to above mentioned insurance via Latent Key/confirmation #/EOC [id number] Status is pending

## 2024-02-08 NOTE — Telephone Encounter (Signed)
 Pharmacy Patient Advocate Encounter  Received notification from [facility] [employer] ([employer]) that Prior Authorization for Wegovy  0.25MG /0.5ML auto-injectors has been DENIED.  Full denial letter will be uploaded to the media tab. See denial reason below.   PA #/Case ID/Reference #: [id number]

## 2024-02-08 NOTE — Telephone Encounter (Signed)
 Pharmacy Patient Advocate Encounter  Received notification from [facility] MEDICA[employer] that Prior Authorization for Wegovy  has been DENIED.  Full denial letter will be uploaded to the media tab. See denial reason below.

## 2024-02-10 ENCOUNTER — Ambulatory Visit: Payer: Self-pay

## 2024-02-10 DIAGNOSIS — R300 Dysuria: Secondary | ICD-10-CM | POA: Insufficient documentation

## 2024-02-10 LAB — URINE CULTURE

## 2024-02-10 NOTE — Assessment & Plan Note (Signed)
 Symptoms suggest UTI.  Urine culture pending. - Prescribe antibiotic BID for 7 days. - Contact via MyChart with culture results and adjust antibiotic if necessary.

## 2024-02-10 NOTE — Assessment & Plan Note (Signed)
 Obesity previously managed with Wegovy . Insurance coverage for Wegovy  uncertain. - Submit [facility]  prescription to [facility] and verify insurance coverage.

## 2024-02-11 ENCOUNTER — Encounter

## 2024-02-14 NOTE — Telephone Encounter (Signed)
 Patient called on Thursday 02/10/24 to cancel her Friday night 02/11/24 SS appt. Due to her child has hand foot and mouth.  She is r/s for 03/24/24 at 9 pm.  Sent mychart message and mailed new packet.

## 2024-02-15 ENCOUNTER — Ambulatory Visit: Payer: Self-pay | Admitting: *Deleted

## 2024-02-15 NOTE — Telephone Encounter (Signed)
 FYI Only or Action Required?: FYI only for provider.  Patient was last seen in primary care on 02/07/2024 by Bevely Doffing, FNP.  Called Nurse Triage reporting Chest Pain.  Symptoms began yesterday.  Interventions attempted: Rest, hydration, or home remedies.  Symptoms are: gradually worsening.  Triage Disposition: Go to ED Now (or PCP Triage)  Patient/caregiver understands and will follow disposition?: Yes            Copied from CRM #[id number]. Topic: Clinical - Red Word Triage >> Feb 15, 2024  1:40 PM Linda Warner wrote: Red Word that prompted transfer to Nurse Triage:  She has been having chest pains last night. Pain when she inhales and moves around. Her was hurting last night but not today. Reason for Disposition  Taking a deep breath makes pain worse  Answer Assessment - Initial Assessment Questions Recommended ED now      1. LOCATION: Where does it hurt?       Upper chest  2. RADIATION: Does the pain go anywhere else? (e.g., into neck, jaw, arms, back)     Upper back  3. ONSET: When did the chest pain begin? (Minutes, hours or days)      Last night  4. PATTERN: Does the pain come and go, or has it been constant since it started?  Does it get worse with exertion?      Constant esp with inhaling  5. DURATION: How long does it last (e.g., seconds, minutes, hours)     Seconds until breathing out  6. SEVERITY: How bad is the pain?  (e.g., Scale 1-10; mild, moderate, or severe)     6/10  7. CARDIAC RISK FACTORS: Do you have any history of heart problems or risk factors for heart disease? (e.g., angina, prior heart attack; diabetes, high blood pressure, high cholesterol, smoker, or strong family history of heart disease)     High blood pressure, no smoking but does vape  8. PULMONARY RISK FACTORS: Do you have any history of lung disease?  (e.g., blood clots in lung, asthma, emphysema, birth control pills)     No  9. CAUSE: What do you think is  causing the chest pain?     Not sure  10. OTHER SYMPTOMS: Do you have any other symptoms? (e.g., dizziness, nausea, vomiting, sweating, fever, difficulty breathing, cough)       Chest pain with breathing in  11. PREGNANCY: Is there any chance you are pregnant? When was your last menstrual period?       na  Protocols used: Chest Pain-A-AH

## 2024-02-15 NOTE — Telephone Encounter (Signed)
Patient advised  ED 

## 2024-02-16 ENCOUNTER — Emergency Department (HOSPITAL_COMMUNITY)
Admission: EM | Admit: 2024-02-16 | Discharge: 2024-02-16 | Disposition: A | Discharge status: Home / Self Care | Attending: Emergency Medicine | Admitting: Emergency Medicine

## 2024-02-16 ENCOUNTER — Encounter (HOSPITAL_COMMUNITY): Payer: Self-pay

## 2024-02-16 ENCOUNTER — Emergency Department (HOSPITAL_COMMUNITY)

## 2024-02-16 ENCOUNTER — Telehealth: Admitting: Physician Assistant

## 2024-02-16 ENCOUNTER — Other Ambulatory Visit: Payer: Self-pay

## 2024-02-16 ENCOUNTER — Ambulatory Visit: Payer: Self-pay

## 2024-02-16 DIAGNOSIS — R072 Precordial pain: Secondary | ICD-10-CM | POA: Insufficient documentation

## 2024-02-16 DIAGNOSIS — K219 Gastro-esophageal reflux disease without esophagitis: Secondary | ICD-10-CM | POA: Diagnosis not present

## 2024-02-16 DIAGNOSIS — R0789 Other chest pain: Secondary | ICD-10-CM

## 2024-02-16 LAB — COMPREHENSIVE METABOLIC PANEL WITH GFR
ALT: 18 U/L (ref 0–44)
AST: 17 U/L (ref 15–41)
Albumin: 4.6 g/dL (ref 3.5–5.0)
Alkaline Phosphatase: 53 U/L (ref 38–126)
Anion gap: 12 (ref 5–15)
BUN: 9 mg/dL (ref 6–20)
CO2: 23 mmol/L (ref 22–32)
Calcium: 9.7 mg/dL (ref 8.9–10.3)
Chloride: 104 mmol/L (ref 98–111)
Creatinine, Ser: 0.88 mg/dL (ref 0.44–1.00)
GFR, Estimated: >60 mL/min (ref >60)
Glucose, Bld: 84 mg/dL (ref 70–99)
Potassium: 4.5 mmol/L (ref 3.5–5.1)
Sodium: 138 mmol/L (ref 135–145)
Total Bilirubin: 0.2 mg/dL (ref 0.0–1.2)
Total Protein: 7.9 g/dL (ref 6.5–8.1)

## 2024-02-16 LAB — URINALYSIS, ROUTINE W REFLEX MICROSCOPIC
Bilirubin Urine: NEGATIVE
Glucose, UA: NEGATIVE mg/dL
Hgb urine dipstick: NEGATIVE
Ketones, ur: NEGATIVE mg/dL
Leukocytes,Ua: NEGATIVE
Nitrite: NEGATIVE
Protein, ur: 30 mg/dL — AB
Specific Gravity, Urine: 1.027 (ref 1.005–1.030)
pH: 6 (ref 5.0–8.0)

## 2024-02-16 LAB — CBC WITH DIFFERENTIAL/PLATELET
Abs Immature Granulocytes: 0.01 K/uL (ref 0.00–0.07)
Basophils Absolute: 0 K/uL (ref 0.0–0.1)
Basophils Relative: 1 %
Eosinophils Absolute: 0.2 K/uL (ref 0.0–0.5)
Eosinophils Relative: 8 %
HCT: 37.5 % (ref 36.0–46.0)
Hemoglobin: 13.1 g/dL (ref 12.0–15.0)
Immature Granulocytes: 0 %
Lymphocytes Relative: 39 %
Lymphs Abs: 1.3 K/uL (ref 0.7–4.0)
MCH: 28.2 pg (ref 26.0–34.0)
MCHC: 34.9 g/dL (ref 30.0–36.0)
MCV: 80.8 fL (ref 80.0–100.0)
Monocytes Absolute: 0.5 K/uL (ref 0.1–1.0)
Monocytes Relative: 15 %
Neutro Abs: 1.2 K/uL — ABNORMAL LOW (ref 1.7–7.7)
Neutrophils Relative %: 37 %
Platelets: 279 K/uL (ref 150–400)
RBC: 4.64 MIL/uL (ref 3.87–5.11)
RDW: 13.9 % (ref 11.5–15.5)
Smear Review: NORMAL
WBC: 3.2 K/uL — ABNORMAL LOW (ref 4.0–10.5)
nRBC: 0 % (ref 0.0–0.2)

## 2024-02-16 LAB — D-DIMER, QUANTITATIVE: D-Dimer, Quant: 0.91 ug{FEU}/mL — ABNORMAL HIGH (ref 0.00–0.50)

## 2024-02-16 MED ORDER — IOHEXOL 350 MG/ML SOLN
75.0000 mL | Freq: Once | INTRAVENOUS | Status: AC | PRN
  Administered 2024-02-16: 75 mL via INTRAVENOUS

## 2024-02-16 MED ORDER — PANTOPRAZOLE SODIUM 20 MG PO TBEC: 20.0000 mg | DELAYED_RELEASE_TABLET | Freq: Every day | ORAL | 0 refills | Status: DC

## 2024-02-16 NOTE — ED Provider Notes (Signed)
 [place] EMERGENCY DEPARTMENT AT [facility] Provider Note   CSN: [id number] Arrival date & time: 02/16/24  0745     Patient presents with: Chest Pain   Linda Warner is a 33 y.o. female.   Patient complains of chest pain and GERD symptoms.  The history is provided by the patient and medical records. No language interpreter was used.  Chest Pain Pain location:  Substernal area Pain quality: aching   Pain radiates to:  Does not radiate Pain severity:  Mild Onset quality:  Sudden Timing:  Intermittent Progression:  Worsening Context: not breathing   Associated symptoms: no abdominal pain, no back pain, no cough, no fatigue and no headache        Prior to Admission medications   Medication Sig Start Date End Date Taking? Authorizing Provider  pantoprazole  (PROTONIX ) 20 MG tablet Take 1 tablet (20 mg total) by mouth daily. 02/16/24  Yes Suzette Pac, MD  BORIC ACID VAGINAL VA Place vaginally.    [provider]  metroNIDAZOLE  (FLAGYL ) 500 MG tablet Take 1 tablet (500 mg total) by mouth 2 (two) times daily. 10/17/23   Raspet, Erin K, PA-C  olmesartan  (BENICAR ) 20 MG tablet TAKE 1/2 TABLET BY MOUTH DAILY 01/28/24   Del Orbe Polanco, Iliana, FNP  rizatriptan  (MAXALT ) 5 MG tablet Take 1 tablet (5 mg total) by mouth as needed for migraine. May repeat in 2 h prn, no more than 2 pills/24 h, no more than 3 pills/week. 12/20/23   Athar, Saima, MD  semaglutide -weight management (WEGOVY ) 0.25 MG/0.5ML SOAJ SQ injection Inject 0.25 mg into the skin every 7 (seven) days. 02/07/24   Bevely Doffing, FNP  topiramate  (TOPAMAX ) 25 MG tablet Take 2 tablets (50 mg total) by mouth at bedtime. This is the final dose, follow instructions provided verbally and in after visit summary (AVS in MyChart). 12/20/23   Buck Saucer, MD    Allergies: Amlodipine     Review of Systems  Constitutional:  Negative for appetite change and fatigue.  HENT:  Negative for congestion, ear discharge and  sinus pressure.   Eyes:  Negative for discharge.  Respiratory:  Negative for cough.   Cardiovascular:  Positive for chest pain.  Gastrointestinal:  Negative for abdominal pain and diarrhea.  Genitourinary:  Negative for frequency and hematuria.  Musculoskeletal:  Negative for back pain.  Skin:  Negative for rash.  Neurological:  Negative for seizures and headaches.  Psychiatric/Behavioral:  Negative for hallucinations.     Updated Vital Signs BP 116/83   Pulse 95   Temp 98.1 F (36.7 C) (Oral)   Resp 13   Ht 5' 2 (1.575 m)   Wt 91.6 kg   LMP 02/11/2024 (Approximate)   SpO2 98%   BMI 36.95 kg/m   Physical Exam Vitals and nursing note reviewed.  Constitutional:      Appearance: She is well-developed.  HENT:     Head: Normocephalic.     Nose: Nose normal.     Mouth/Throat:     Mouth: Mucous membranes are moist.  Eyes:     General: No scleral icterus.    Conjunctiva/sclera: Conjunctivae normal.  Neck:     Thyroid : No thyromegaly.  Cardiovascular:     Rate and Rhythm: Normal rate and regular rhythm.     Heart sounds: No murmur heard.    No friction rub. No gallop.  Pulmonary:     Breath sounds: No stridor. No wheezing or rales.  Chest:     Chest  wall: No tenderness.  Abdominal:     General: There is no distension.     Tenderness: There is no abdominal tenderness. There is no rebound.  Musculoskeletal:        General: Normal range of motion.     Cervical back: Neck supple.  Lymphadenopathy:     Cervical: No cervical adenopathy.  Skin:    Findings: No erythema or rash.  Neurological:     Mental Status: She is alert and oriented to person, place, and time.     Motor: No abnormal muscle tone.     Coordination: Coordination normal.  Psychiatric:        Behavior: Behavior normal.     (all labs ordered are listed, but only abnormal results are displayed) Labs Reviewed  URINALYSIS, ROUTINE W REFLEX MICROSCOPIC - Abnormal; Notable for the following components:       Result Value   APPearance CLOUDY (*)    Protein, ur 30 (*)    Bacteria, UA RARE (*)    All other components within normal limits  CBC WITH DIFFERENTIAL/PLATELET - Abnormal; Notable for the following components:   WBC 3.2 (*)    Neutro Abs 1.2 (*)    All other components within normal limits  D-DIMER, QUANTITATIVE - Abnormal; Notable for the following components:   D-Dimer, Quant 0.91 (*)    All other components within normal limits  COMPREHENSIVE METABOLIC PANEL WITH GFR    EKG: EKG Interpretation Date/Time:  Wednesday February 16 2024 08:17:43 EDT Ventricular Rate:  97 PR Interval:  178 QRS Duration:  86 QT Interval:  341 QTC Calculation: 434 R Axis:   47  Text Interpretation: Sinus rhythm Baseline wander in lead(s) II III aVR aVF V2 V3 Confirmed by Suzette Pac [phone]) on 02/16/2024 1:45:53 PM  Radiology: CT Angio Chest PE W and/or Wo Contrast Result Date: 02/16/2024 CLINICAL DATA:  Pulmonary embolism (PE) suspected, high prob EXAM: CT ANGIOGRAPHY CHEST WITH CONTRAST TECHNIQUE: Multidetector CT imaging of the chest was performed using the standard protocol during bolus administration of intravenous contrast. Multiplanar CT image reconstructions and MIPs were obtained to evaluate the vascular anatomy. RADIATION DOSE REDUCTION: This exam was performed according to the departmental dose-optimization program which includes automated exposure control, adjustment of the mA and/or kV according to patient size and/or use of iterative reconstruction technique. CONTRAST:  75mL OMNIPAQUE  IOHEXOL  350 MG/ML SOLN COMPARISON:  02/16/2024 05/25/2020 FINDINGS: Pulmonary Embolism: No pulmonary embolism. Cardiovascular: No cardiomegaly. Trace pericardial effusion.No aortic aneurysm. Mediastinum/Nodes: No mediastinal mass.Small amount of nonexpansile soft tissue in the anterior mediastinum, likely residual thymic tissueenlarged right axillary and subpectoral lymph nodes. The largest lymph node  measures 1.7 cm in short axis dimension (axial 15). Inferior right axillary lymph node measures 1.5 cm (axial 24). The largest retropectoral lymph node measures 1.2 cm (axial 20). Prominent left axillary lymph nodes measuring up to 9 mm in short axis dimension. Lungs/Pleura: The midline trachea and bronchi are patent. No focal airspace consolidation, pleural effusion, or pneumothorax. Musculoskeletal: No acute fracture or destructive bone lesion. Moderate dextrocurvature of the thoracolumbar spine. Multilevel thoracolumbar osteophytosis. Upper Abdomen: No acute abnormality in the partially visualized upper abdomen. Review of the MIP images confirms the above findings. IMPRESSION: 1. No acute intrathoracic abnormality; specifically, no pulmonary embolism, pneumonia, or pleural effusion. 2. Enlarged, right axillary and subpectoral lymph nodes measuring up to 1.7 cm in short axis dimension. While these may be reactive in the setting of right upper extremity inflammation or recent vaccination, clinical correlation  is recommended, as these may be related to underlying systemic illness, developing lymphoproliferative disorder, or metastatic disease. Electronically Signed   By: Rogelia Myers M.D.   On: 02/16/2024 14:25   DG Chest 2 View Result Date: 02/16/2024 CLINICAL DATA:  Chest pain. EXAM: CHEST - 2 VIEW COMPARISON:  May 25, 2020. FINDINGS: The heart size and mediastinal contours are within normal limits. Both lungs are clear. The visualized skeletal structures are unremarkable. IMPRESSION: No active cardiopulmonary disease. Electronically Signed   By: Lynwood Landy Raddle M.D.   On: 02/16/2024 08:51     Procedures   Medications Ordered in the ED  iohexol  (OMNIPAQUE ) 350 MG/ML injection 75 mL (75 mLs Intravenous Contrast Given 02/16/24 1312)                                    Medical Decision Making Amount and/or Complexity of Data Reviewed Labs: ordered. Radiology: ordered. ECG/medicine tests:  ordered.  Risk Prescription drug management.   Patient with normal troponins.  CT scan of the chest unremarkable.  Patient with atypical chest pain which is most likely GERD.  She started on Protonix  and will follow-up with PCP     Final diagnoses:  Atypical chest pain    ED Discharge Orders          Ordered    pantoprazole  (PROTONIX ) 20 MG tablet  Daily        02/16/24 1344               Suzette Pac, MD 02/16/24 1654

## 2024-02-16 NOTE — Telephone Encounter (Signed)
 Called [facility], no answer, sent message HP.

## 2024-02-16 NOTE — Patient Instructions (Signed)
  Linda Warner, thank you for joining Lovette Borg, PA-C for today's virtual visit.  While this provider is not your primary care provider (PCP), if your PCP is located in our provider database this encounter information will be shared with them immediately following your visit.   A [facility] MyChart account gives you access to today's visit and all your visits, tests, and labs performed at [facility]  click here if you don't have a [facility] MyChart account or go to [email or web address]  Consent: (Patient) Linda Warner provided verbal consent for this virtual visit at the beginning of the encounter.  Current Medications:  Current Outpatient Medications:    BORIC ACID VAGINAL VA, Place vaginally., Disp: , Rfl:    metroNIDAZOLE  (FLAGYL ) 500 MG tablet, Take 1 tablet (500 mg total) by mouth 2 (two) times daily., Disp: 14 tablet, Rfl: 0   olmesartan  (BENICAR ) 20 MG tablet, TAKE 1/2 TABLET BY MOUTH DAILY, Disp: 45 tablet, Rfl: 1   pantoprazole  (PROTONIX ) 20 MG tablet, Take 1 tablet (20 mg total) by mouth daily., Disp: 30 tablet, Rfl: 0   rizatriptan  (MAXALT ) 5 MG tablet, Take 1 tablet (5 mg total) by mouth as needed for migraine. May repeat in 2 h prn, no more than 2 pills/24 h, no more than 3 pills/week., Disp: 10 tablet, Rfl: 2   semaglutide -weight management (WEGOVY ) 0.25 MG/0.5ML SOAJ SQ injection, Inject 0.25 mg into the skin every 7 (seven) days., Disp: 2 mL, Rfl: 1   topiramate  (TOPAMAX ) 25 MG tablet, Take 2 tablets (50 mg total) by mouth at bedtime. This is the final dose, follow instructions provided verbally and in after visit summary (AVS in MyChart)., Disp: 60 tablet, Rfl: 3   Medications ordered in this encounter:  No orders of the defined types were placed in this encounter.    *If you need refills on other medications prior to your next appointment, please contact your pharmacy*  Follow-Up: Call back or seek an in-person evaluation if the symptoms worsen  or if the condition fails to improve as anticipated.  [facility] [phone]  Other Instructions Fill and start taking pantoprazole . Reviewed negative CXR result. Reviewed blood work result including abnormal D-dimer, but CT chest negative for PE.  Reviewed CT result with reactive lymph nodes in the R axillary and subpectoral region which needs further clinical correlation. Follow up with PCP to review CT chest result. Avoid triggers for acid-reflux ie ketchup or tomato/citrus based foods.   If you have been instructed to have an in-person evaluation today at a local Urgent Care facility, please use the link below. It will take you to a list of all of our available [facility], including address, phone number and hours of operation. Please do not delay care.  [facility]  If you or a family member do not have a primary care provider, use the link below to schedule a visit and establish care. When you choose a [facility] primary care physician or advanced practice provider, you gain a long-term partner in health. Find a Primary Care Provider  Learn more about [facility] in-office and virtual care options:  - Get Care Now

## 2024-02-16 NOTE — Progress Notes (Signed)
 Virtual Visit Consent   Linda Warner, you are scheduled for a virtual visit with a [facility] provider today. Just as with appointments in the office, your consent must be obtained to participate. Your consent will be active for this visit and any virtual visit you may have with one of our providers in the next 365 days. If you have a MyChart account, a copy of this consent can be sent to you electronically.  As this is a virtual visit, video technology does not allow for your provider to perform a traditional examination. This may limit your provider's ability to fully assess your condition. If your provider identifies any concerns that need to be evaluated in person or the need to arrange testing (such as labs, EKG, etc.), we will make arrangements to do so. Although advances in technology are sophisticated, we cannot ensure that it will always work on either your end or our end. If the connection with a video visit is poor, the visit may have to be switched to a telephone visit. With either a video or telephone visit, we are not always able to ensure that we have a secure connection.  By engaging in this virtual visit, you consent to the provision of healthcare and authorize for your insurance to be billed (if applicable) for the services provided during this visit. Depending on your insurance coverage, you may receive a charge related to this service.  I need to obtain your verbal consent now. Are you willing to proceed with your visit today? Linda Warner has provided verbal consent on 02/16/2024 for a virtual visit (video or telephone). Linda Borg, PA-C  Date: 02/16/2024 5:29 PM   Virtual Visit via Video Note   I, Zeus Marquis, connected with  Linda Warner  ([id number], [birth year]) on 02/16/24 at  5:15 PM EDT by a video-enabled telemedicine application and verified that I am speaking with the correct person using two identifiers.  Location: Patient: Virtual Visit Location Patient:  Home Provider: Virtual Visit Location Provider: Home Office   I discussed the limitations of evaluation and management by telemedicine and the availability of in person appointments. The patient expressed understanding and agreed to proceed.    History of Present Illness: Linda Warner is a 33 y.o. who identifies as a female who was assigned female at birth, and is being seen today for follow up visit from ER.  HPI: 33y/o F presents for a telehealth video visit for a follow up for     Problems:  Patient Active Problem List   Diagnosis Date Noted   Dysuria 02/10/2024   Migraines 05/06/2023   Cervical cancer screening 03/17/2023   Hypertension 03/11/2023   Obesity, Class II, BMI 35-39.9 03/11/2023   History of preterm delivery 09/28/2019   History of gestational hypertension 09/04/2019   Abnormal chromosomal and genetic finding on antenatal screening of mother 04/03/2019   Family history of sickle cell disease 03/20/2019   Postpartum depression 01/30/2013   SCOLIOSIS 07/25/2008   MOOD SWINGS 10/14/2006    Allergies:  Allergies  Allergen Reactions   Amlodipine  Rash   Medications:  Current Outpatient Medications:    BORIC ACID VAGINAL VA, Place vaginally., Disp: , Rfl:    metroNIDAZOLE  (FLAGYL ) 500 MG tablet, Take 1 tablet (500 mg total) by mouth 2 (two) times daily., Disp: 14 tablet, Rfl: 0   olmesartan  (BENICAR ) 20 MG tablet, TAKE 1/2 TABLET BY MOUTH DAILY, Disp: 45 tablet, Rfl: 1   pantoprazole  (PROTONIX ) 20 MG tablet,  Take 1 tablet (20 mg total) by mouth daily., Disp: 30 tablet, Rfl: 0   rizatriptan  (MAXALT ) 5 MG tablet, Take 1 tablet (5 mg total) by mouth as needed for migraine. May repeat in 2 h prn, no more than 2 pills/24 h, no more than 3 pills/week., Disp: 10 tablet, Rfl: 2   semaglutide -weight management (WEGOVY ) 0.25 MG/0.5ML SOAJ SQ injection, Inject 0.25 mg into the skin every 7 (seven) days., Disp: 2 mL, Rfl: 1   topiramate  (TOPAMAX ) 25 MG tablet, Take 2 tablets  (50 mg total) by mouth at bedtime. This is the final dose, follow instructions provided verbally and in after visit summary (AVS in MyChart)., Disp: 60 tablet, Rfl: 3  Observations/Objective: Patient is well-developed, well-nourished in no acute distress.  Resting comfortably  at home.  Head is normocephalic, atraumatic.  No labored breathing.  Speech is clear and coherent with logical content.  Patient is alert and oriented at baseline.    Assessment and Plan: 1. Gastroesophageal reflux disease, unspecified whether esophagitis present (Primary)  Fill and start taking pantoprazole . Reviewed negative CXR result. Reviewed blood work result including abnormal D-dimer, but CT chest negative for PE.  Reviewed CT result with reactive lymph nodes in the R axillary and subpectoral region which needs further clinical correlation. Follow up with PCP to review CT chest result. Avoid triggers for acid-reflux ie ketchup or tomato/citrus based foods.  Pt verbalized understanding and in agreement.     Follow Up Instructions: I discussed the assessment and treatment plan with the patient. The patient was provided an opportunity to ask questions and all were answered. The patient agreed with the plan and demonstrated an understanding of the instructions.  A copy of instructions were sent to the patient via MyChart unless otherwise noted below.   Patient has requested to receive PHI (AVS, Work Notes, etc) pertaining to this video visit through e-mail as they are currently without active MyChart. They have voiced understand that email is not considered secure and their health information could be viewed by someone other than the patient.   The patient was advised to call back or seek an in-person evaluation if the symptoms worsen or if the condition fails to improve as anticipated.    Dalayah Deahl, PA-C

## 2024-02-16 NOTE — Telephone Encounter (Signed)
 FYI Only or Action Required?: Action required by provider: clinical question for provider.  Patient was last seen in primary care on 02/07/2024 by Bevely Doffing, FNP.  Called Nurse Triage reporting Chest Pain.  Symptoms began several days ago.  Interventions attempted: Nothing.  Symptoms are: gradually worsening.  Triage Disposition: Go to ED Now (or PCP Triage)  Patient/caregiver understands and will follow disposition?: No, wishes to speak with PCP  Copied from CRM #[id number]. Topic: Clinical - Red Word Triage >> Feb 16, 2024  2:23 PM Rosaria BRAVO wrote: Red Word that prompted transfer to Nurse Triage: Chest pain, also leg pain in groin/hip.    ----------------------------------------------------------------------- From previous Reason for Contact - Scheduling: Patient/patient representative is calling to schedule an appointment. Refer to attachments for appointment information. Reason for Disposition  Taking a deep breath makes pain worse  Answer Assessment - Initial Assessment Questions Advised ED now. Patient requesting CALL BACK/ appt today; pt seen in ED, left prior to d/c and reports ED states they will call patient back if blood clot is indicated.  Denies HA, blurred vision,chest pain, dizziness, faint, n/v. Patient reports deep breath causing chest pain to worsen. 1. LOCATION: Where does it hurt?       Center chest, below neck and between breasts; went to ED, left AMA, without discharge. groin 2. RADIATION: Does the pain go anywhere else? (e.g., into neck, jaw, arms, back)     no 3. ONSET: When did the chest pain begin? (Minutes, hours or days)      Monday night chest pain, had heartburn then continued to next day.  Groin area last night, tender to touch, walking feel the pressure of it, no redness or swelling, warm to touch; 4/10 4. PATTERN: Does the pain come and go, or has it been constant since it started?  Does it get worse with exertion?      Comes and  goes, feel it when breathing in, turn to sides and laying down it gets worse 5. DURATION: How long does it last (e.g., seconds, minutes, hours)     constant 6. SEVERITY: How bad is the pain?  (e.g., Scale 1-10; mild, moderate, or severe)     7/10 7. CARDIAC RISK FACTORS: Do you have any history of heart problems or risk factors for heart disease? (e.g., angina, prior heart attack; diabetes, high blood pressure, high cholesterol, smoker, or strong family history of heart disease)     HTN 8. PULMONARY RISK FACTORS: Do you have any history of lung disease?  (e.g., blood clots in lung, asthma, emphysema, birth control pills)     no 9. CAUSE: What do you think is causing the chest pain?     unsure 10. OTHER SYMPTOMS: Do you have any other symptoms? (e.g., dizziness, nausea, vomiting, sweating, fever, difficulty breathing, cough)       denies  Protocols used: Chest Pain-A-AH

## 2024-02-16 NOTE — Discharge Instructions (Signed)
 Follow-up with your family doctor next week.  We will call you if your chest CT shows you have a blood clot

## 2024-02-16 NOTE — ED Triage Notes (Signed)
 Patient come in POV for complaint of Chest pain that started Monday, Patient stated I believed it was acid reflux, but the sharp pains continued to get worse, also noted throat is sore at night. When she inhales and turns noticed the back goes to her back, and bilateral groin area. Has been taking antibiotics for a UTI, had stopped taking BP medication due to being told she could not take the antibiotic with her other medication.

## 2024-02-17 ENCOUNTER — Other Ambulatory Visit: Payer: Self-pay

## 2024-02-17 ENCOUNTER — Telehealth: Payer: Self-pay

## 2024-02-17 DIAGNOSIS — R300 Dysuria: Secondary | ICD-10-CM

## 2024-02-17 NOTE — Telephone Encounter (Signed)
 Copied from CRM #[id number]. Topic: Appointments - Scheduling Inquiry for Clinic >> Feb 17, 2024  8:10 AM Darshell M wrote: Reason for CRM: Patient scheduled for first available 02/24/24. Would like to be seen earlier due to for ER follow up for chest pain. Imaging showed enlarged lymph nodes in chest/breast and  under arm.

## 2024-02-17 NOTE — Telephone Encounter (Signed)
 Pt left ED before being discharged. She has appt scheduled, is being added to waitlist for cancellations

## 2024-02-18 ENCOUNTER — Ambulatory Visit: Payer: Self-pay | Admitting: Family Medicine

## 2024-02-24 ENCOUNTER — Ambulatory Visit (INDEPENDENT_AMBULATORY_CARE_PROVIDER_SITE_OTHER): Payer: Self-pay | Admitting: Nurse Practitioner

## 2024-02-24 VITALS — BP 119/84 | HR 84 | Ht 62.0 in | Wt 205.0 lb

## 2024-02-24 DIAGNOSIS — R590 Localized enlarged lymph nodes: Secondary | ICD-10-CM | POA: Diagnosis not present

## 2024-02-24 DIAGNOSIS — B369 Superficial mycosis, unspecified: Secondary | ICD-10-CM | POA: Diagnosis not present

## 2024-02-24 DIAGNOSIS — D709 Neutropenia, unspecified: Secondary | ICD-10-CM

## 2024-02-24 DIAGNOSIS — R3915 Urgency of urination: Secondary | ICD-10-CM | POA: Diagnosis not present

## 2024-02-24 DIAGNOSIS — Z113 Encounter for screening for infections with a predominantly sexual mode of transmission: Secondary | ICD-10-CM

## 2024-02-24 MED ORDER — KETOCONAZOLE 2 % EX CREA: 1.0000 | TOPICAL_CREAM | Freq: Two times a day (BID) | CUTANEOUS | 0 refills | Status: DC

## 2024-02-24 NOTE — Progress Notes (Signed)
 Subjective:    Patient ID: Linda Warner, female    DOB: 13-Jun-1990, 33 y.o.   MRN: [id number]  HPI Discussed the use of AI scribe software for clinical note transcription with the patient, who gave verbal consent to proceed.  History of Present Illness Linda Warner is a 33 year old female who presents with concerns about enlarged lymph nodes and a persistent rash under her arms.   Approximately eight days ago, she experienced chest pain resembling heartburn, which persisted throughout the night, prompting a visit to the emergency department. Imaging studies, including a CT scan, revealed enlarged lymph nodes in the right axillary and subpectoral regions. She was previously unaware of these lymph nodes. There is no breast pain, but she has a rash under both arms, present for about a year, which occasionally itches. Various creams, including triamcinolone , have been tried without significant improvement.  She has a history of urinary symptoms, including urgency and occasional itching, but no fever or burning sensation. She was diagnosed with a urinary tract infection in the past year and completed a course of antibiotics. She also has a history of recurrent bacterial vaginosis, but recent tests have been negative. This is being treated by GYN. Used boric acid within the past 24 hours. While she has no specific symptoms and with the same female partner, would like testing for GC/CT.  Her family history includes breast cancer on her mother's side, with her grandmother having had breast cancer at an older age and her aunt having benign breast testing results. She has not had any recent vaccinations except for a tetanus shot in July.  She has scoliosis and has not yet completed a sleep study to evaluate for sleep apnea. 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 No current chest pain, shortness of breath, cough, or fever. Reports urgency to urinate and occasional itching but no leakage or burning. No  unexplained weight loss or fevers.    Review of Systems  Constitutional:  Positive for fatigue. Negative for fever and unexpected weight change.  Respiratory:  Negative for chest tightness and shortness of breath.   Cardiovascular:  Negative for chest pain.  Genitourinary:  Positive for urgency. Negative for dysuria, flank pain, frequency, pelvic pain and vaginal discharge.       Objective:   Physical Exam Vitals and nursing note reviewed.  Constitutional:      General: She is not in acute distress. Cardiovascular:     Rate and Rhythm: Normal rate and regular rhythm.  Pulmonary:     Effort: Pulmonary effort is normal.     Breath sounds: Normal breath sounds.  Chest:  Breasts:    Right: No swelling, inverted nipple, mass, nipple discharge, skin change or tenderness.     Left: No inverted nipple, mass, nipple discharge, skin change or tenderness.     Comments: Several small non tender mobile rubbery lymph noted near the crease of the right axillae, otherwise clear.  Abdominal:     General: There is no distension.     Palpations: Abdomen is soft.     Tenderness: There is no abdominal tenderness. There is no right CVA tenderness, left CVA tenderness, guarding or rebound.  Genitourinary:    Comments: Performed a self swab for STI testing.  Musculoskeletal:     Cervical back: Neck supple.  Lymphadenopathy:     Cervical: No cervical adenopathy.     Upper Body:     Right upper body: Axillary adenopathy present. No supraclavicular  or pectoral adenopathy.     Left upper body: No supraclavicular, axillary or pectoral adenopathy.  Skin:    Findings: Rash present.     Comments: Well defined faint darkened skin noted in the mid axillary area bilaterally. No lesions or excoriation noted. No other rash.   Neurological:     Mental Status: She is alert.  Psychiatric:        Mood and Affect: Mood normal.        Behavior: Behavior normal.        Thought Content: Thought content normal.     Results for orders placed or performed during the hospital encounter of 02/16/24  CBC with Differential   Collection Time: 02/16/24  8:22 AM  Result Value Ref Range   WBC 3.2 (L) 4.0 - 10.5 K/uL   RBC 4.64 3.87 - 5.11 MIL/uL   Hemoglobin 13.1 12.0 - 15.0 g/dL   HCT 62.4 63.9 - 53.9 %   MCV 80.8 80.0 - 100.0 fL   MCH 28.2 26.0 - 34.0 pg   MCHC 34.9 30.0 - 36.0 g/dL   RDW 86.0 88.4 - 84.4 %   Platelets 279 150 - 400 K/uL   nRBC 0.0 0.0 - 0.2 %   Neutrophils Relative % 37 %   Neutro Abs 1.2 (L) 1.7 - 7.7 K/uL   Lymphocytes Relative 39 %   Lymphs Abs 1.3 0.7 - 4.0 K/uL   Monocytes Relative 15 %   Monocytes Absolute 0.5 0.1 - 1.0 K/uL   Eosinophils Relative 8 %   Eosinophils Absolute 0.2 0.0 - 0.5 K/uL   Basophils Relative 1 %   Basophils Absolute 0.0 0.0 - 0.1 K/uL   WBC Morphology MORPHOLOGY UNREMARKABLE    RBC Morphology MORPHOLOGY UNREMARKABLE    Smear Review Normal platelet morphology    Immature Granulocytes 0 %   Abs Immature Granulocytes 0.01 0.00 - 0.07 K/uL  Comprehensive metabolic panel   Collection Time: 02/16/24  8:22 AM  Result Value Ref Range   Sodium 138 135 - 145 mmol/L   Potassium 4.5 3.5 - 5.1 mmol/L   Chloride 104 98 - 111 mmol/L   CO2 23 22 - 32 mmol/L   Glucose, Bld 84 70 - 99 mg/dL   BUN 9 6 - 20 mg/dL   Creatinine, Ser 9.11 0.44 - 1.00 mg/dL   Calcium 9.7 8.9 - 89.6 mg/dL   Total Protein 7.9 6.5 - 8.1 g/dL   Albumin 4.6 3.5 - 5.0 g/dL   AST 17 15 - 41 U/L   ALT 18 0 - 44 U/L   Alkaline Phosphatase 53 38 - 126 U/L   Total Bilirubin 0.2 0.0 - 1.2 mg/dL   GFR, Estimated >39 >39 mL/min   Anion gap 12 5 - 15  D-dimer, quantitative   Collection Time: 02/16/24  9:13 AM  Result Value Ref Range   D-Dimer, Quant 0.91 (H) 0.00 - 0.50 ug/mL-FEU  Urinalysis, Routine w reflex microscopic -Urine, Clean Catch   Collection Time: 02/16/24  1:30 PM  Result Value Ref Range   Color, Urine YELLOW YELLOW   APPearance CLOUDY (A) CLEAR   Specific Gravity,  Urine 1.027 1.005 - 1.030   pH 6.0 5.0 - 8.0   Glucose, UA NEGATIVE NEGATIVE mg/dL   Hgb urine dipstick NEGATIVE NEGATIVE   Bilirubin Urine NEGATIVE NEGATIVE   Ketones, ur NEGATIVE NEGATIVE mg/dL   Protein, ur 30 (A) NEGATIVE mg/dL   Nitrite NEGATIVE NEGATIVE   Leukocytes,Ua NEGATIVE NEGATIVE   RBC /  HPF 0-5 0 - 5 RBC/hpf   WBC, UA 0-5 0 - 5 WBC/hpf   Bacteria, UA RARE (A) NONE SEEN   Squamous Epithelial / HPF 11-20 0 - 5 /HPF   Mucus PRESENT    Urinalysis from lab: normal today.  Today's Vitals   02/24/24 1511  BP: 119/84  Pulse: 84  SpO2: 98%  Weight: 205 lb (93 kg)  Height: 5' 2 (1.575 m)   Body mass index is 37.49 kg/m.        Assessment & Plan:  1. Urinary urgency  - Urinalysis normal  2. 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 Ineffective response to triamcinolone  suggests fungal etiology. - Prescribe ketoconazole cream twice daily. - Reassess rash in two weeks if no better. - ketoconazole (NIZORAL) 2 % cream; Apply 1 Application topically 2 (two) times daily. To rash prn  Dispense: 30 g; Refill: 0  3. Axillary lymphadenopathy (Primary) Right axillary and subpectoral lymphadenopathy associated with chronic intertriginous rash Lymphadenopathy on CT scan, possibly reactive, coincides with chronic rash. Differential includes systemic illness, lymphoproliferative disorder, or metastatic disease, but less likely without systemic symptoms. Recent tetanus vaccination could contribute. - US  LIMITED ULTRASOUND INCLUDING AXILLA RIGHT BREAST - US  LIMITED ULTRASOUND INCLUDING AXILLA LEFT BREAST   4. Neutropenia, unspecified type  - CBC with Differential/Platelet  5. Screening examination for STI  - NuSwab Vaginitis Plus (VG+)  Return if symptoms worsen or fail to improve. Recommend routine follow up with PCP.

## 2024-02-26 ENCOUNTER — Encounter: Payer: Self-pay | Admitting: Nurse Practitioner

## 2024-02-27 ENCOUNTER — Ambulatory Visit: Payer: Self-pay | Admitting: Nurse Practitioner

## 2024-02-27 LAB — NUSWAB VAGINITIS PLUS (VG+)
Candida albicans, NAA: NEGATIVE
Candida glabrata, NAA: NEGATIVE
Chlamydia trachomatis, NAA: NEGATIVE
Neisseria gonorrhoeae, NAA: NEGATIVE
Trich vag by NAA: NEGATIVE

## 2024-02-28 LAB — URINALYSIS
Bilirubin, UA: NEGATIVE
Glucose, UA: NEGATIVE
Ketones, UA: NEGATIVE
Leukocytes,UA: NEGATIVE
Nitrite, UA: NEGATIVE
Protein,UA: NEGATIVE
RBC, UA: NEGATIVE
Specific Gravity, UA: 1.03 (ref 1.005–1.030)
Urobilinogen, Ur: 0.2 mg/dL (ref 0.2–1.0)
pH, UA: 6 (ref 5.0–7.5)

## 2024-02-29 ENCOUNTER — Other Ambulatory Visit: Payer: Self-pay | Admitting: Nurse Practitioner

## 2024-02-29 DIAGNOSIS — R590 Localized enlarged lymph nodes: Secondary | ICD-10-CM

## 2024-03-06 ENCOUNTER — Other Ambulatory Visit: Payer: Self-pay | Admitting: Nurse Practitioner

## 2024-03-06 DIAGNOSIS — B369 Superficial mycosis, unspecified: Secondary | ICD-10-CM

## 2024-03-07 ENCOUNTER — Other Ambulatory Visit (HOSPITAL_COMMUNITY): Payer: Self-pay | Admitting: Nurse Practitioner

## 2024-03-07 DIAGNOSIS — R590 Localized enlarged lymph nodes: Secondary | ICD-10-CM

## 2024-03-08 ENCOUNTER — Encounter: Payer: Self-pay | Admitting: Family Medicine

## 2024-03-08 ENCOUNTER — Telehealth: Payer: Self-pay

## 2024-03-08 NOTE — Telephone Encounter (Signed)
 Refill request sent to [facility] for review

## 2024-03-08 NOTE — Telephone Encounter (Signed)
 Copied from CRM [id number]. Topic: Clinical - Prescription Issue >> Mar 08, 2024 10:49 AM Tobias CROME wrote: Reason for CRM: Patient inquiring why prescription was denied. Patient unsure as to why it was declined.   Requesting callback, [phone]

## 2024-03-08 NOTE — Telephone Encounter (Signed)
 Message sent to [facility] for refill request since pcp is out of office

## 2024-03-08 NOTE — Telephone Encounter (Signed)
Pt called to check on status of this refill. Please advise.

## 2024-03-12 ENCOUNTER — Other Ambulatory Visit: Payer: Self-pay | Admitting: Family Medicine

## 2024-03-12 DIAGNOSIS — B369 Superficial mycosis, unspecified: Secondary | ICD-10-CM

## 2024-03-12 MED ORDER — KETOCONAZOLE 2 % EX CREA: 1.0000 | TOPICAL_CREAM | Freq: Two times a day (BID) | CUTANEOUS | 0 refills | Status: DC

## 2024-03-14 ENCOUNTER — Encounter: Payer: Self-pay | Admitting: Nurse Practitioner

## 2024-03-14 ENCOUNTER — Ambulatory Visit: Admitting: Nurse Practitioner

## 2024-03-14 VITALS — BP 125/87 | HR 86 | Temp 97.9°F | Ht 62.0 in | Wt 206.0 lb

## 2024-03-14 DIAGNOSIS — L819 Disorder of pigmentation, unspecified: Secondary | ICD-10-CM

## 2024-03-14 DIAGNOSIS — B369 Superficial mycosis, unspecified: Secondary | ICD-10-CM

## 2024-03-14 DIAGNOSIS — R590 Localized enlarged lymph nodes: Secondary | ICD-10-CM

## 2024-03-14 DIAGNOSIS — D72829 Elevated white blood cell count, unspecified: Secondary | ICD-10-CM | POA: Diagnosis not present

## 2024-03-14 NOTE — Progress Notes (Unsigned)
   Subjective:    Patient ID: Linda Warner, female    DOB: 04-Dec-1990, 33 y.o.   MRN: [id number]  CC: follow up visit  HPI 33 year old, female, arrived for a follow up visit on an ongoing rash under both arms that has been ongoing for a year. She feels that the rash has had some improvement. It remains pruritic. The area has lightened minimally but remains slightly hyperpigmented.    She has her US  and mammogram Thursday, 03/16/24, this week for axillary lymphadenopathy noted incidentally on chest CT 02/16/24.   Review of Systems  Constitutional:  Negative for fever.  Respiratory:  Negative for cough, shortness of breath and wheezing.   Cardiovascular:  Negative for chest pain.  Gastrointestinal:  Negative for constipation, diarrhea, nausea and vomiting.  Skin:  Positive for rash.       Bilateral axillary        Objective:   Physical Exam Vitals and nursing note reviewed. Exam conducted with a chaperone present.  Constitutional:      General: She is not in acute distress.    Appearance: Normal appearance.  Cardiovascular:     Rate and Rhythm: Normal rate and regular rhythm.  Pulmonary:     Effort: Pulmonary effort is normal.     Breath sounds: Normal breath sounds.  Skin:    General: Skin is warm and dry.     Findings: Rash present.     Comments: Shiny, mildly erythematous non raised rash noted axillary area bilaterally. Minimal hyperpigmentation compared to previous visit. Non tender to palpation. No rash noted under the breasts.   Neurological:     Mental Status: She is alert and oriented to person, place, and time.  Psychiatric:        Mood and Affect: Mood normal.        Behavior: Behavior normal.        Thought Content: Thought content normal.    Vitals:   03/14/24 1423  BP: 125/87  Pulse: 86  Temp: 97.9 F (36.6 C)  Height: 5' 2 (1.575 m)  Weight: 93.4 kg  SpO2: 100%  BMI (Calculated): 37.67          Assessment & Plan:  1. Fungal skin infection  (Primary) Continue to use the prescribed ketoconazole cream as directed.  2. Axillary lymphadenopathy Mammogram and US  scheduled for Thursday, 03/16/24.  Further follow up based on results.  - CBC with Differential  3. Leukocytosis, unspecified type - CBC with Differential     Return in about 4 weeks (around 04/11/2024).   I have seen and examined this patient alongside the NP student. I have reviewed and verified the student note and agree with the assessment and plan.  Elveria Quarry, FNP

## 2024-03-14 NOTE — Patient Instructions (Signed)
 Kojic acid and tumeric soap

## 2024-03-15 LAB — CBC WITH DIFFERENTIAL/PLATELET
Basophils Absolute: 0.1 x10E3/uL (ref 0.0–0.2)
Basos: 1 %
EOS (ABSOLUTE): 0.2 x10E3/uL (ref 0.0–0.4)
Eos: 4 %
Hematocrit: 38.5 % (ref 34.0–46.6)
Hemoglobin: 12.1 g/dL (ref 11.1–15.9)
Immature Grans (Abs): 0 x10E3/uL (ref 0.0–0.1)
Immature Granulocytes: 0 %
Lymphocytes Absolute: 2.5 x10E3/uL (ref 0.7–3.1)
Lymphs: 47 %
MCH: 27.4 pg (ref 26.6–33.0)
MCHC: 31.4 g/dL — ABNORMAL LOW (ref 31.5–35.7)
MCV: 87 fL (ref 79–97)
Monocytes Absolute: 0.4 x10E3/uL (ref 0.1–0.9)
Monocytes: 7 %
Neutrophils Absolute: 2.2 x10E3/uL (ref 1.4–7.0)
Neutrophils: 41 %
Platelets: 347 x10E3/uL (ref 150–450)
RBC: 4.41 x10E6/uL (ref 3.77–5.28)
RDW: 14.4 % (ref 11.7–15.4)
WBC: 5.4 x10E3/uL (ref 3.4–10.8)

## 2024-03-16 ENCOUNTER — Ambulatory Visit (HOSPITAL_COMMUNITY)
Admission: RE | Admit: 2024-03-16 | Discharge: 2024-03-16 | Disposition: A | Discharge status: Home / Self Care | Source: Ambulatory Visit | Attending: Nurse Practitioner | Admitting: Nurse Practitioner

## 2024-03-16 ENCOUNTER — Encounter (HOSPITAL_COMMUNITY): Payer: Self-pay

## 2024-03-16 DIAGNOSIS — R590 Localized enlarged lymph nodes: Secondary | ICD-10-CM | POA: Insufficient documentation

## 2024-03-17 ENCOUNTER — Ambulatory Visit: Payer: Self-pay | Admitting: Nurse Practitioner

## 2024-03-17 ENCOUNTER — Encounter: Payer: Self-pay | Admitting: Nurse Practitioner

## 2024-03-24 ENCOUNTER — Encounter

## 2024-03-27 ENCOUNTER — Ambulatory Visit (INDEPENDENT_AMBULATORY_CARE_PROVIDER_SITE_OTHER): Admitting: Women's Health

## 2024-03-27 ENCOUNTER — Encounter: Payer: Self-pay | Admitting: Women's Health

## 2024-03-27 VITALS — BP 120/84 | HR 81 | Ht 63.0 in | Wt 202.5 lb

## 2024-03-27 DIAGNOSIS — N898 Other specified noninflammatory disorders of vagina: Secondary | ICD-10-CM

## 2024-03-27 DIAGNOSIS — Z1331 Encounter for screening for depression: Secondary | ICD-10-CM | POA: Diagnosis not present

## 2024-03-27 NOTE — Progress Notes (Signed)
 GYN VISIT Patient name: Linda Warner MRN [id number]  Date of birth: Apr 14, 1991 Chief Complaint:   Vaginal Odor  (Wants pH test. )  History of Present Illness:   Linda Warner is a 33 y.o. [id number] African-American female being seen today for report of vaginal odor since birth of her last child in 2021. No abnormal d/c, itching/irritation. Worse after sex and period. Goes away w/ boric acid suppositories, has been using daily. Odor is not fishy, just different. Just had neg Nuswab w/ PCP 10/9. Uses tea tree hut soap daily, takes showers, odor goes away for about 12 hours then returns.  Patient's last menstrual period was 03/03/2024 (approximate). The current method of family planning is coitus interruptus.  Last pap 03/17/23. Results were: NILM w/ HRHPV negative     03/27/2024    1:35 PM 03/14/2024    2:26 PM 02/24/2024    3:18 PM 08/19/2023   10:52 AM 03/17/2023   10:40 AM  Depression screen PHQ 2/9  Decreased Interest 0 0 0 0 0  Down, Depressed, Hopeless 0 0 0 0 0  PHQ - 2 Score 0 0 0 0 0  Altered sleeping 0 0 0 0   Tired, decreased energy 0 0 1 0   Change in appetite 0 1 0 1   Feeling bad or failure about yourself  0 0 0 0   Trouble concentrating 0 0 0 0   Moving slowly or fidgety/restless 0 0 0 0   Suicidal thoughts 0 0 0 0   PHQ-9 Score 0 1  1  1     Difficult doing work/chores  Not difficult at all Not difficult at all  Not difficult at all     Data saved with a previous flowsheet row definition        03/27/2024    1:35 PM 03/14/2024    2:26 PM 02/24/2024    3:22 PM  GAD 7 : Generalized Anxiety Score  Nervous, Anxious, on Edge 0 0 0  Control/stop worrying 0 0 0  Worry too much - different things 0 0 0  Trouble relaxing 0 0 0  Restless 0 0 0  Easily annoyed or irritable 0 0 1  Afraid - awful might happen 0 0 1  Total GAD 7 Score 0 0 2  Anxiety Difficulty  Not difficult at all Not difficult at all     Review of Systems:   Pertinent items are noted in  HPI Denies fever/chills, dizziness, headaches, visual disturbances, fatigue, shortness of breath, chest pain, abdominal pain, vomiting, abnormal vaginal discharge/itching/odor/irritation, problems with periods, bowel movements, urination, or intercourse unless otherwise stated above.  Pertinent History Reviewed:  Reviewed past medical,surgical, social, obstetrical and family history.  Reviewed problem list, medications and allergies. Physical Assessment:   Vitals:   03/27/24 1328  BP: 120/84  Pulse: 81  Weight: 202 lb 8 oz (91.9 kg)  Height: 5' 3 (1.6 m)  Body mass index is 35.87 kg/m.       Physical Examination:   General appearance: alert, well appearing, and in no distress  Mental status: alert, oriented to person, place, and time  Skin: warm & dry   Cardiovascular: normal heart rate noted  Respiratory: normal respiratory effort, no distress  Abdomen: soft, non-tender   Pelvic: VULVA: normal appearing vulva with no masses, tenderness or lesions, VAGINA: normal appearing vagina with normal color and discharge, no lesions, CERVIX: normal appearing cervix without discharge or lesions  Extremities: no edema  Chaperone: Aleck Blase  No results found for this or any previous visit (from the past 24 hours).  Assessment & Plan:  1) Vaginal odor> neg Nuswab 02/24/24, will check genital mycoplasmas. Sounds more like BV though. Advised no soap in genital area, water only, ok to continue boric acid suppositories prn. Gave printed recurrent vaginitis info  Meds: No orders of the defined types were placed in this encounter.   Orders Placed This Encounter  Procedures   Genital Mycoplasmas NAA, Swab    Return for prn.  Suzen JONELLE Fetters CNM, [facility] 03/27/2024 1:59 PM

## 2024-03-27 NOTE — Patient Instructions (Signed)
Recurrent Vaginitis Alternative Therapies  Both options are to be done after sexual intercourse, your period ends, and when you think you may have bacterial vaginosis (BV) or a yeast infection.  If symptoms persist you will need to schedule an appointment to be seen  1) Soak in tub of waist- high warm water with 1/2 cup of baking soda for at least 20 mins.  2) Soak 3 tampons in 1 tablespoon of fractionated (liquid form) coconut oil with 10 drops of Melaleuca (Tea Tree) essential oil, insert 1 saturated tampon vaginally at bedtime x 3 days.    You can purchase Melaleuca/Tea Tree oil online at amazon.com, with a [employer] or [employer] representative, or locally at:  [employer] [address] [place], [place] [address] [phone]  [employer] [address] [place], [place] [address] [phone]  You may also want to consider making the changes below:  . Soap: Unscented Dove (white box light green writing)  . Wash cloth: use a separate white washcloth for your genital area . Laundry detergent: Dreft or unscented Arm n' Hammer  . Underwear: White 100% cotton panties (NOT just cotton crouch) . Sanitary pads/tampons: Unscented only-If it doesn't SAY unscented it can have a scent/perfume    . NO PERFUMES OR LOTIONS OR POTIONS in the genital area (may use regular KY) . Condoms: hypoallergenic only, non-dyed (no color) . Toilet paper: White unscented only   

## 2024-03-29 ENCOUNTER — Ambulatory Visit: Payer: Self-pay | Admitting: Women's Health

## 2024-03-29 LAB — GENITAL MYCOPLASMAS NAA, SWAB
Mycoplasma genitalium NAA: NEGATIVE
Mycoplasma hominis NAA: NEGATIVE
Ureaplasma spp NAA: NEGATIVE

## 2024-04-12 ENCOUNTER — Ambulatory Visit

## 2024-04-12 VITALS — BP 119/84 | HR 107 | Resp 18 | Ht 62.0 in | Wt 198.1 lb

## 2024-04-12 DIAGNOSIS — R590 Localized enlarged lymph nodes: Secondary | ICD-10-CM | POA: Diagnosis not present

## 2024-04-12 DIAGNOSIS — R0981 Nasal congestion: Secondary | ICD-10-CM | POA: Diagnosis not present

## 2024-04-12 NOTE — Progress Notes (Signed)
 Established Patient Office Visit  Subjective   Patient ID: Linda Warner, female    DOB: [birth year]  Age: 33 y.o. MRN: [id number]  Chief Complaint  Patient presents with   Medical Management of Chronic Issues    4 week follow up    Nasal Congestion    Pt states she would like referral to ENT for continues sinus congestion     HPI Discussed the use of AI scribe software for clinical note transcription with the patient, who gave verbal consent to proceed.  History of Present Illness    Linda Warner is a 33 year old female who presents with a rash and nasal breathing difficulties.  Cutaneous rash - Rash is improving and lightening in color.  Nasal obstruction and olfactory dysfunction - Difficulty breathing through nose since nasal fracture sustained years ago while playing with cousin. - Reduced sense of smell, only able to detect very strong odors. - Previous use of nasal sprays such as Flonase .  Pharyngitis - Recent episode of tonsillitis with white patches in throat. - History of recurrent tonsillitis.     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   History of preterm delivery 09/28/2019   History of gestational hypertension 09/04/2019   Abnormal chromosomal and genetic finding on antenatal screening of mother 04/03/2019   Family history of sickle cell disease 03/20/2019   Postpartum depression 01/30/2013   SCOLIOSIS 07/25/2008   MOOD SWINGS 10/14/2006    ROS    Objective:     BP 119/84   Pulse (!) 107   Resp 18   Ht 5' 2 (1.575 m)   Wt 198 lb 1.3 oz (89.8 kg)   LMP 03/03/2024 (Approximate)   SpO2 98%   BMI 36.23 kg/m  BP Readings from Last 3 Encounters:  04/12/24 119/84  03/27/24 120/84  03/14/24 125/87   Wt Readings from Last 3 Encounters:  04/12/24  198 lb 1.3 oz (89.8 kg)  03/27/24 202 lb 8 oz (91.9 kg)  03/14/24 206 lb (93.4 kg)     Physical Exam Vitals and nursing note reviewed.  Constitutional:      Appearance: Normal appearance.  HENT:     Head: Normocephalic.  Eyes:     Extraocular Movements: Extraocular movements intact.     Pupils: Pupils are equal, round, and reactive to light.  Cardiovascular:     Rate and Rhythm: Normal rate and regular rhythm.  Pulmonary:     Effort: Pulmonary effort is normal.     Breath sounds: Normal breath sounds.  Musculoskeletal:     Cervical back: Normal range of motion and neck supple.  Neurological:     Mental Status: She is alert and oriented to person, place, and time.  Psychiatric:        Mood and Affect: Mood normal.        Thought Content: Thought content normal.      No results found for any visits on 04/12/24.    The ASCVD Risk score (Arnett DK, et al., 2019) failed to calculate for the following reasons:   The 2019 ASCVD risk score is only valid for ages 70 to 71    Assessment & Plan:   Problem List Items Addressed This Visit       Immune and Lymphatic   Axillary  lymphadenopathy - Primary   - Ordered repeat ultrasound in six months.      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 Previous nasal spray use noted. - Referred to ENT specialist for evaluation and management.      Relevant Orders   Ambulatory referral to ENT    No follow-ups on file.    Leita Longs, FNP

## 2024-04-16 DIAGNOSIS — R0981 Nasal congestion: Secondary | ICD-10-CM | POA: Insufficient documentation

## 2024-04-16 DIAGNOSIS — R590 Localized enlarged lymph nodes: Secondary | ICD-10-CM | POA: Insufficient documentation

## 2024-04-16 NOTE — Assessment & Plan Note (Signed)
-   Ordered repeat ultrasound in six months.

## 2024-04-16 NOTE — Assessment & Plan Note (Signed)
 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 Previous nasal spray use noted. - Referred to ENT specialist for evaluation and management.

## 2024-04-25 ENCOUNTER — Telehealth

## 2024-04-25 ENCOUNTER — Ambulatory Visit: Admitting: Neurology

## 2024-04-25 DIAGNOSIS — L219 Seborrheic dermatitis, unspecified: Secondary | ICD-10-CM

## 2024-04-25 MED ORDER — KETOCONAZOLE 2 % EX SHAM: 1.0000 | MEDICATED_SHAMPOO | CUTANEOUS | 0 refills | Status: DC

## 2024-04-25 NOTE — Patient Instructions (Signed)
  Linda Warner, thank you for joining [name], PA-C for today's virtual visit.  While this provider is not your primary care provider (PCP), if your PCP is located in our provider database this encounter information will be shared with them immediately following your visit.   A [facility] MyChart account gives you access to today's visit and all your visits, tests, and labs performed at [facility]  click here if you don't have a [facility] MyChart account or go to [email or web address]  Consent: (Patient) Linda Warner provided verbal consent for this virtual visit at the beginning of the encounter.  Current Medications:  Current Outpatient Medications:    [START ON 04/27/2024] ketoconazole  (NIZORAL ) 2 % shampoo, Apply 1 Application topically 2 (two) times a week., Disp: 120 mL, Rfl: 0   BORIC ACID VAGINAL VA, Place vaginally., Disp: , Rfl:    olmesartan  (BENICAR ) 20 MG tablet, TAKE 1/2 TABLET BY MOUTH DAILY, Disp: 45 tablet, Rfl: 1   Medications ordered in this encounter:  Meds ordered this encounter  Medications   ketoconazole  (NIZORAL ) 2 % shampoo    Sig: Apply 1 Application topically 2 (two) times a week.    Dispense:  120 mL    Refill:  0    Supervising Provider:   BLAISE ALEENE KIDD [[id number]]     *If you need refills on other medications prior to your next appointment, please contact your pharmacy*  Follow-Up: Call back or seek an in-person evaluation if the symptoms worsen or if the condition fails to improve as anticipated.  [facility] [phone]  Other Instructions Please keep skin clean and dry. Avoid touching the face as much as possible. Start head and shoulder shampoo to as your daily shampoo.  You can also apply a small layer of this around the nose, letting sit for a minute before rinsing thoroughly. Use the ketoconazole  shampoo twice weekly. Make sure to shower with warm but not scalding hot water, as this is  better for your skin. Try to cut down on soda intake.  Drink plenty of water. If you note any non-resolving, new, or worsening symptoms despite treatment, please seek an in-person evaluation ASAP.    If you have been instructed to have an in-person evaluation today at a local Urgent Care facility, please use the link below. It will take you to a list of all of our available [facility], including address, phone number and hours of operation. Please do not delay care.  [facility]  If you or a family member do not have a primary care provider, use the link below to schedule a visit and establish care. When you choose a [facility] primary care physician or advanced practice provider, you gain a long-term partner in health. Find a Primary Care Provider  Learn more about [facility]'s in-office and virtual care options: [facility] - Get Care Now

## 2024-04-25 NOTE — Progress Notes (Signed)
 Virtual Visit Consent   [name], you are scheduled for a virtual visit with a [facility] provider today. Just as with appointments in the office, your consent must be obtained to participate. Your consent will be active for this visit and any virtual visit you may have with one of our providers in the next 365 days. If you have a MyChart account, a copy of this consent can be sent to you electronically.  As this is a virtual visit, video technology does not allow for your provider to perform a traditional examination. This may limit your provider's ability to fully assess your condition. If your provider identifies any concerns that need to be evaluated in person or the need to arrange testing (such as labs, EKG, etc.), we will make arrangements to do so. Although advances in technology are sophisticated, we cannot ensure that it will always work on either your end or our end. If the connection with a video visit is poor, the visit may have to be switched to a telephone visit. With either a video or telephone visit, we are not always able to ensure that we have a secure connection.  By engaging in this virtual visit, you consent to the provision of healthcare and authorize for your insurance to be billed (if applicable) for the services provided during this visit. Depending on your insurance coverage, you may receive a charge related to this service.  I need to obtain your verbal consent now. Are you willing to proceed with your visit today? [name] has provided verbal consent on 04/25/2024 for a virtual visit (video or telephone). Linda Warner, [place]  Date: 04/25/2024 9:53 AM   Virtual Visit via Video Note   I, Linda Warner, connected with  NAVEYA ELLERMAN  ([id number], September 03, 1990) on 04/25/24 at  9:45 AM EST by a video-enabled telemedicine application and verified that I am speaking with the correct person using two identifiers.  Location: Patient: Virtual Visit Location  Patient: Home Provider: Virtual Visit Location Provider: Home Office   I discussed the limitations of evaluation and management by telemedicine and the availability of in person appointments. The patient expressed understanding and agreed to proceed.    History of Present Illness: Linda Warner is a 33 y.o. who identifies as a female who was assigned female at birth, and is being seen today for patches of red, itchy and flaky skin of her scalp, first noted about 2 weeks ago.  Notes a few patches throughout her scalp.  Denies pain or bleeding.  Notes some similar oily flaking around her nose.  Denies rash noted elsewhere.  Denies any change to soaps, lotions, detergents or other hygiene products.  Denies change to make-up.  Denies change in stress levels.  Notes she has been drinking mainly sodas, whereas previously she drank only water.  No other dietary changes.   HPI: HPI  Problems:  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   History of preterm delivery 09/28/2019   History of gestational hypertension 09/04/2019   Abnormal chromosomal and genetic finding on antenatal screening of mother 04/03/2019   Family history of sickle cell disease 03/20/2019   Postpartum depression 01/30/2013   SCOLIOSIS 07/25/2008   MOOD SWINGS 10/14/2006    Allergies:  Allergies  Allergen Reactions  Amlodipine  Rash   Medications:  Current Outpatient Medications:    BORIC ACID VAGINAL VA, Place vaginally., Disp: , Rfl:    olmesartan  (BENICAR ) 20 MG tablet, TAKE 1/2 TABLET BY MOUTH DAILY, Disp: 45 tablet, Rfl: 1  Observations/Objective: Patient is well-developed, well-nourished in no acute distress.  Resting comfortably at home.  Head is normocephalic, atraumatic.  No labored breathing.  Speech is clear and  coherent with logical content.  Patient is alert and oriented at baseline.  Scattered erythematous patches of scalp with oily flaking noted.  No noted plaques or silver scaling.  Small area of redness and oily skin around the nares.  No perioral lesions noted.  Assessment and Plan: 1. Seborrheic dermatitis (Primary)  Symptoms consistent with seborrheic dermatitis.  Discussed inflammatory versus fungal/yeast.  Skin care reviewed.  Will start head and shoulders as her daily shampoo.  Ketoconazole  shampoo prescribed to use twice weekly.  She can apply to the area around the nose as well.  Follow-up with us  or in person for any nonresolving, new or worsening symptoms despite treatment.  Follow Up Instructions: I discussed the assessment and treatment plan with the patient. The patient was provided an opportunity to ask questions and all were answered. The patient agreed with the plan and demonstrated an understanding of the instructions.  A copy of instructions were sent to the patient via MyChart unless otherwise noted below.   The patient was advised to call back or seek an in-person evaluation if the symptoms worsen or if the condition fails to improve as anticipated.    Linda Velma Lunger, PA-C

## 2024-04-27 ENCOUNTER — Encounter: Payer: Self-pay | Admitting: Neurology

## 2024-04-27 ENCOUNTER — Ambulatory Visit: Admitting: Neurology

## 2024-05-19 ENCOUNTER — Ambulatory Visit: Payer: Self-pay | Admitting: Family Medicine

## 2024-05-23 ENCOUNTER — Other Ambulatory Visit: Payer: Self-pay | Admitting: Family Medicine

## 2024-05-23 DIAGNOSIS — B369 Superficial mycosis, unspecified: Secondary | ICD-10-CM

## 2024-05-25 ENCOUNTER — Ambulatory Visit (INDEPENDENT_AMBULATORY_CARE_PROVIDER_SITE_OTHER): Admitting: Otolaryngology

## 2024-05-25 ENCOUNTER — Encounter (INDEPENDENT_AMBULATORY_CARE_PROVIDER_SITE_OTHER): Payer: Self-pay | Admitting: Otolaryngology

## 2024-05-25 VITALS — BP 127/81 | HR 51 | Ht 62.0 in | Wt 200.0 lb

## 2024-05-25 DIAGNOSIS — J342 Deviated nasal septum: Secondary | ICD-10-CM

## 2024-05-25 DIAGNOSIS — J3489 Other specified disorders of nose and nasal sinuses: Secondary | ICD-10-CM | POA: Diagnosis not present

## 2024-05-25 DIAGNOSIS — R0981 Nasal congestion: Secondary | ICD-10-CM | POA: Diagnosis not present

## 2024-05-25 DIAGNOSIS — J3089 Other allergic rhinitis: Secondary | ICD-10-CM

## 2024-05-25 DIAGNOSIS — J343 Hypertrophy of nasal turbinates: Secondary | ICD-10-CM

## 2024-05-25 DIAGNOSIS — R438 Other disturbances of smell and taste: Secondary | ICD-10-CM | POA: Diagnosis not present

## 2024-05-25 MED ORDER — FLUTICASONE PROPIONATE 50 MCG/ACT NA SUSP: 2.0000 | Freq: Two times a day (BID) | NASAL | 6 refills | Status: AC

## 2024-05-25 NOTE — Patient Instructions (Addendum)
" ° °  Use flonase  two sprays each nostril twice per day; point to corner of the eye on same side  Aureliano Med Nasal Saline Rinse  - start nasal saline rinses with NeilMed Bottle available over the counter    Nasal Saline Irrigation instructions: If you choose to make your own salt water solution, You will need: Salt (kosher, canning, or pickling salt) Baking soda Nasal irrigation bottle (i.e. Aureliano Med Sinus Rinse) Measuring spoon ( teaspoon) Distilled / boiled water   Mix solution Mix 1 teaspoon of salt, 1/2 teaspoon of baking soda and 1 cup of water into irrigation bottle ** May use saline packet instead of homemade recipe for this step if you prefer If medicine was prescribed to be mixed with solution, place this into bottle Examples 2 inches of 2% mupirocin ointment Budesonide solution Position your head: Lean over sink (about 45 degrees) Rotate head (about 45 degrees) so that one nostril is above the other Irrigate Insert tip of irrigation bottle into upper nostril so it forms a comfortable seal Irrigate while breathing through your mouth May remove the straw from the bottle in order to irrigate the entire solution (important if medicine was added) Exhale through nose when finished and blow nose as necessary  Repeat on opposite side with other 1/2 of solution (120 mL) or remake solution if all 240 mL was used on first side Wash irrigation bottle regularly, replace every 3 months  "

## 2024-05-25 NOTE — Progress Notes (Signed)
 Dear Dr. Bevely, Here is my assessment for our mutual patient, Linda Warner. Thank you for allowing me the opportunity to care for your patient. Please do not hesitate to contact me should you have any other questions. Sincerely, Dr. Eldora Blanch  Otolaryngology Clinic Note  HISTORY:  Initial visit (05/2024): Discussed the use of AI scribe software for clinical note transcription with the patient, who gave verbal consent to proceed.  History of Present Illness Linda Warner is a 34 year old female with history of nasal trauma who presents with chronic nasal obstruction and hyposmia.  She sustained a nasal fracture several years ago, after which she developed progressive nasal obstruction and impaired olfaction, more so on the left but also on right. Symptoms were delayed in onset but have gradually worsened since the injury.  Nasal obstruction is near constant, worse on the left, and feels like a severe cold with congestion. It is present year-round and worsens during allergy season. She denies frequent sinus infections, facial pain or pressure, discolored drainage, epistaxis, or anterior or posterior nasal drainage.  She has persistent hyposmia and usually perceives only strong odors. Smell loss does not fluctuate with allergy symptoms. Taste intact.  She has tried saline nasal sprays and oral antihistamines (Zyrtec ) with minimal benefit. Zyrtec  mildly improves allergy symptoms but not baseline congestion or hyposmia. She has not used intranasal steroids, Breathe Right strips, or had nasal or sinus surgery or allergy testing.  She denies neurological symptoms, facial numbness, or weakness. She does not use tobacco and is not on anticoagulants. No neurological sx; eats fairly balanced diet  Allergy testing has not been done. No previous sinonasal surgery. She is currently using nasal saline spray.  AP/AC: no  Tobacco: no  PMHx: Scoliosis, HTN, Headaches (some migranous  component)  RADIOGRAPHIC EVALUATION AND INDEPENDENT REVIEW OF OTHER RECORDS: Linda Warner (04/12/2024): Nasal obstruction since nasal fracture several years ago, reduced sense of smell; Dx: Nasal Congestion; Rx: ref to ENT CT Head 05/22/2023 independent interpreted with respect to nasal cavity: right septal deviation, bilateral inferior turbinate hypertrophy, b/l ethmoid MPT; no obvious skull base large lesions noted; thick cuts so suboptimal study CBC w/diff 03/14/2024: WBC 5.4, Eos 200 Past Medical History:  Diagnosis Date   Acne    BV (bacterial vaginosis)    Gestational hypertension    Miscarriage    Post partum depression 01/30/2013   Pregnant    Scoliosis    Scoliosis    Past Surgical History:  Procedure Laterality Date   NO PAST SURGERIES     Family History  Problem Relation Age of Onset   Depression Father    Breast cancer Maternal Grandmother    Depression Maternal Grandmother    Diabetes Maternal Grandmother    Hyperlipidemia Maternal Grandmother    Hypertension Maternal Grandmother    Cancer Maternal Grandmother        breast   Sickle cell anemia Son    ADD / ADHD Son    Anesthesia problems Neg Hx    Sleep apnea Neg Hx    Migraines Neg Hx    Social History   Tobacco Use   Smoking status: Former    Current packs/day: 0.25    Average packs/day: 0.3 packs/day for 2.0 years (0.5 ttl pk-yrs)    Types: Cigarettes    Start date: 2024   Smokeless tobacco: Never  Substance Use Topics   Alcohol use: Yes    Comment: ocassional   Allergies[1] Current Outpatient Medications  Medication Sig  Dispense Refill   BORIC ACID VAGINAL VA Place vaginally.     fluticasone  (FLONASE ) 50 MCG/ACT nasal spray Place 2 sprays into both nostrils in the morning and at bedtime. 16 g 6   ketoconazole  (NIZORAL ) 2 % shampoo Apply 1 Application topically 2 (two) times a week. 120 mL 0   olmesartan  (BENICAR ) 20 MG tablet TAKE 1/2 TABLET BY MOUTH DAILY 45 tablet 1   No current  facility-administered medications for this visit.   BP 127/81 (BP Location: Right Arm, Patient Position: Sitting, Cuff Size: Large)   Pulse (!) 51   Ht 5' 2 (1.575 m)   Wt 200 lb (90.7 kg)   SpO2 96%   BMI 36.58 kg/m   PHYSICAL EXAM:  BP 127/81 (BP Location: Right Arm, Patient Position: Sitting, Cuff Size: Large)   Pulse (!) 51   Ht 5' 2 (1.575 m)   Wt 200 lb (90.7 kg)   SpO2 96%   BMI 36.58 kg/m    Salient findings:  CN II-XII intact Bilateral EAC clear and TM intact with well pneumatized middle ear spaces Nose: Anterior rhinoscopy reveals septum dev left caudal, right dorsal, L>R IT hypertrophy.  Nasal endoscopy was indicated to better evaluate the nose and paranasal sinuses, given the patient's history and exam findings, and is detailed below. No lesions of oral cavity/oropharynx No obviously palpable neck masses/lymphadenopathy/thyromegaly No respiratory distress or stridor   PROCEDURE:  Prior to initiating any procedures, risks/benefits/alternatives were explained to the patient and verbal consent obtained. Diagnostic Nasal Endoscopy Pre-procedure diagnosis: Concern for nasal congestion and obstruction, hyposmia Post-procedure diagnosis: same Indication: See pre-procedure diagnosis and physical exam above Complications: None apparent EBL: 0 mL Anesthesia: Lidocaine  4% and topical decongestant was topically sprayed in each nasal cavity  Description of Procedure:  Patient was identified. A rigid 30 degree endoscope was utilized to evaluate the sinonasal cavities, mucosa, sinus ostia and turbinates and septum.  Overall, signs of mucosal inflammation are mild but noted.  Also noted are no obvious lesions or masses over visualized olfactory cleft/skull base mucosa.  No mucopurulence, polyps, or masses noted.   Right Middle meatus: clear Right SE Recess: clear Left MM: clear Left SE Recess: clear  CPT CODE -- 31231 - Mod 25   ASSESSMENT:  34 y.o. with:  1. Hyposmia    2. Nasal septal deviation   3. Hypertrophy of both inferior nasal turbinates   4. Nasal congestion   5. Nasal obstruction   6. Other allergic rhinitis    Nasal obstruction/congestion: likely multifactorial including allergy sx but clearly does have a structural cause as well which includes her septal deviation and IT hypertrophy; she has not tried medical management fully  Hyposmia: not anosmia, we discussed DDX including neurological, inflammatory, metabolic, idiopathic, traumatic, amongst others.   PLAN: We've discussed issues and options today.  We reviewed the nasal endoscopy images together.  The risks, benefits and alternatives were discussed and questions answered.  She has elected to proceed with:  - Will start with flonase  BID and daily nasal rinses. Can trial breathe right strips - From smell standpoint, discussed MRI but deferred today; given info for smell retraining and she will try this. If no improvement, consider MRI to r/o neuro causes; ensure functional smoke detectors and gas detectors and proceed with care while preparing or eating food  F/u in 8 weeks, sooner as necessary; consider septo/turbs and MRI at that point  See below regarding exact medications prescribed this encounter including dosages and route: Meds  ordered this encounter  Medications   fluticasone  (FLONASE ) 50 MCG/ACT nasal spray    Sig: Place 2 sprays into both nostrils in the morning and at bedtime.    Dispense:  16 g    Refill:  6     Thank you for allowing me the opportunity to care for your patient. Please do not hesitate to contact me should you have any other questions.  Sincerely, Eldora Blanch, MD Otolaryngologist (ENT), [facility] Phone: [phone] Fax: [phone]  MDM:  [phone] Complexity/Problems addressed: m Data complexity: h- independent interpretation of CT; review of note, lab - Morbidity: m -Drug prescribed or managed: y  05/25/2024, 7:02 PM      [1]   Allergies Allergen Reactions   Amlodipine  Rash

## 2024-06-01 ENCOUNTER — Other Ambulatory Visit: Payer: Self-pay | Admitting: Family Medicine

## 2024-06-01 ENCOUNTER — Telehealth (INDEPENDENT_AMBULATORY_CARE_PROVIDER_SITE_OTHER): Payer: Self-pay | Admitting: Family Medicine

## 2024-06-01 DIAGNOSIS — R590 Localized enlarged lymph nodes: Secondary | ICD-10-CM

## 2024-06-01 NOTE — Progress Notes (Signed)
" ° °  Virtual Visit via Video Note  I connected with Yuritzi D Ozment on 06/01/24 at  2:20 PM EST by a video enabled telemedicine application and verified that I am speaking with the correct person using two identifiers.  Patient Location: Home Provider Location: Home Office  I discussed the limitations, risks, security, and privacy concerns of performing an evaluation and management service by video and the availability of in person appointments. I also discussed with the patient that there may be a patient responsible charge related to this service. The patient expressed understanding and agreed to proceed.  Subjective: PCP: Terry Wilhelmena Lloyd Hilario, FNP  Chief Complaint  Patient presents with   Medical Management of Chronic Issues    Six month follow up    HPI The patient presents today for management of chronic conditions. She reports adherence to her treatment regimen with no side effects or adverse effects from her medications.  She reports that the lymph node under her armpit has not increased in size and is not causing her any problems. She denies associated concerning features, including firmness to touch, immobility, painlessness, progressive enlargement, or irregular shape.    ROS: Per HPI Current Medications[1]  Observations/Objective: There were no vitals filed for this visit. Physical Exam Patient is well-developed, well-nourished in no acute distress.  Resting comfortably at home.  Head is normocephalic, atraumatic.  No labored breathing.  Speech is clear and coherent with logical content.  Patient is alert and oriented at baseline.   Assessment and Plan: There are no diagnoses linked to this encounter. Encouraged the patient to continue her/his treatment regimen as prescribed. A heart-healthy diet was encouraged, along with increased physical activity as tolerated.   Encouraged to follow up if having concerning features, including firmness to touch, immobility,  painlessness, progressive enlargement, or irregular shape Follow Up Instructions: No follow-ups on file.   I discussed the assessment and treatment plan with the patient. The patient was provided an opportunity to ask questions, and all were answered. The patient agreed with the plan and demonstrated an understanding of the instructions.   The patient was advised to call back or seek an in-person evaluation if the symptoms worsen or if the condition fails to improve as anticipated.  The above assessment and management plan was discussed with the patient. The patient verbalized understanding of and has agreed to the management plan.   Joannah Gitlin  Z Bacchus, FNP     [1]  Current Outpatient Medications:    BORIC ACID VAGINAL VA, Place vaginally., Disp: , Rfl:    ketoconazole  (NIZORAL ) 2 % shampoo, Apply 1 Application topically 2 (two) times a week., Disp: 120 mL, Rfl: 0   olmesartan  (BENICAR ) 20 MG tablet, TAKE 1/2 TABLET BY MOUTH DAILY, Disp: 45 tablet, Rfl: 1   fluticasone  (FLONASE ) 50 MCG/ACT nasal spray, Place 2 sprays into both nostrils in the morning and at bedtime., Disp: 16 g, Rfl: 6  "

## 2024-06-06 ENCOUNTER — Ambulatory Visit: Admitting: Family Medicine

## 2024-06-13 ENCOUNTER — Other Ambulatory Visit: Payer: Self-pay

## 2024-06-13 DIAGNOSIS — R590 Localized enlarged lymph nodes: Secondary | ICD-10-CM

## 2024-06-16 ENCOUNTER — Other Ambulatory Visit: Payer: Self-pay | Admitting: Family Medicine

## 2024-06-16 DIAGNOSIS — B369 Superficial mycosis, unspecified: Secondary | ICD-10-CM

## 2024-06-20 ENCOUNTER — Telehealth: Admitting: Nurse Practitioner

## 2024-06-20 DIAGNOSIS — L219 Seborrheic dermatitis, unspecified: Secondary | ICD-10-CM | POA: Diagnosis not present

## 2024-06-20 DIAGNOSIS — L509 Urticaria, unspecified: Secondary | ICD-10-CM | POA: Diagnosis not present

## 2024-06-20 DIAGNOSIS — L209 Atopic dermatitis, unspecified: Secondary | ICD-10-CM

## 2024-06-20 MED ORDER — KETOCONAZOLE 2 % EX SHAM: 1.0000 | MEDICATED_SHAMPOO | CUTANEOUS | 0 refills | Status: AC

## 2024-06-20 MED ORDER — PREDNISONE 10 MG (21) PO TBPK: ORAL_TABLET | ORAL | 0 refills | Status: AC

## 2024-06-27 ENCOUNTER — Ambulatory Visit (HOSPITAL_COMMUNITY)

## 2024-07-25 ENCOUNTER — Ambulatory Visit (INDEPENDENT_AMBULATORY_CARE_PROVIDER_SITE_OTHER): Admitting: Otolaryngology
